# Patient Record
Sex: Female | Born: 1935
Health system: Southern US, Community
[De-identification: ages and names within clinical notes are randomized; demographics above are authoritative.]

## PROBLEM LIST (undated history)

## (undated) DIAGNOSIS — I10 Essential (primary) hypertension: Secondary | ICD-10-CM

## (undated) DIAGNOSIS — F419 Anxiety disorder, unspecified: Secondary | ICD-10-CM

## (undated) DIAGNOSIS — Z803 Family history of malignant neoplasm of breast: Secondary | ICD-10-CM

## (undated) DIAGNOSIS — Z808 Family history of malignant neoplasm of other organs or systems: Secondary | ICD-10-CM

## (undated) DIAGNOSIS — C50919 Malignant neoplasm of unspecified site of unspecified female breast: Secondary | ICD-10-CM

## (undated) HISTORY — DX: Family history of malignant neoplasm of breast: Z80.3

## (undated) HISTORY — DX: Essential (primary) hypertension: I10

## (undated) HISTORY — PX: ABDOMINAL HYSTERECTOMY: SUR658

## (undated) HISTORY — PX: INTRAOCULAR PROSTHESES INSERTION: SHX360

## (undated) HISTORY — DX: Anxiety disorder, unspecified: F41.9

## (undated) HISTORY — DX: Malignant neoplasm of unspecified site of unspecified female breast: C50.919

## (undated) HISTORY — DX: Family history of malignant neoplasm of other organs or systems: Z80.8

---

## 1986-10-31 HISTORY — PX: BREAST LUMPECTOMY: SHX2

## 1998-07-30 ENCOUNTER — Ambulatory Visit (HOSPITAL_COMMUNITY): Admission: RE | Admit: 1998-07-30 | Discharge: 1998-07-30 | Payer: Self-pay | Admitting: Orthopedic Surgery

## 1998-07-30 ENCOUNTER — Encounter: Payer: Self-pay | Admitting: Orthopedic Surgery

## 1999-02-25 ENCOUNTER — Ambulatory Visit (HOSPITAL_COMMUNITY): Admission: RE | Admit: 1999-02-25 | Discharge: 1999-02-25 | Payer: Self-pay | Admitting: Obstetrics & Gynecology

## 1999-04-05 ENCOUNTER — Other Ambulatory Visit: Admission: RE | Admit: 1999-04-05 | Discharge: 1999-04-05 | Payer: Self-pay | Admitting: *Deleted

## 2000-06-05 ENCOUNTER — Other Ambulatory Visit: Admission: RE | Admit: 2000-06-05 | Discharge: 2000-06-05 | Payer: Self-pay | Admitting: *Deleted

## 2001-01-16 ENCOUNTER — Ambulatory Visit (HOSPITAL_COMMUNITY): Admission: RE | Admit: 2001-01-16 | Discharge: 2001-01-16 | Payer: Self-pay | Admitting: Internal Medicine

## 2001-01-16 ENCOUNTER — Encounter: Payer: Self-pay | Admitting: Internal Medicine

## 2002-10-17 ENCOUNTER — Other Ambulatory Visit: Admission: RE | Admit: 2002-10-17 | Discharge: 2002-10-17 | Payer: Self-pay | Admitting: *Deleted

## 2002-12-19 ENCOUNTER — Encounter: Payer: Self-pay | Admitting: Internal Medicine

## 2002-12-19 ENCOUNTER — Ambulatory Visit (HOSPITAL_COMMUNITY): Admission: RE | Admit: 2002-12-19 | Discharge: 2002-12-19 | Payer: Self-pay | Admitting: Internal Medicine

## 2002-12-21 ENCOUNTER — Encounter: Payer: Self-pay | Admitting: Emergency Medicine

## 2002-12-21 ENCOUNTER — Emergency Department (HOSPITAL_COMMUNITY): Admission: EM | Admit: 2002-12-21 | Discharge: 2002-12-21 | Payer: Self-pay | Admitting: Emergency Medicine

## 2002-12-31 ENCOUNTER — Encounter: Payer: Self-pay | Admitting: Internal Medicine

## 2002-12-31 ENCOUNTER — Ambulatory Visit (HOSPITAL_COMMUNITY): Admission: RE | Admit: 2002-12-31 | Discharge: 2002-12-31 | Payer: Self-pay | Admitting: Internal Medicine

## 2003-01-23 ENCOUNTER — Encounter: Payer: Self-pay | Admitting: Gastroenterology

## 2003-01-23 ENCOUNTER — Ambulatory Visit (HOSPITAL_COMMUNITY): Admission: RE | Admit: 2003-01-23 | Discharge: 2003-01-23 | Payer: Self-pay | Admitting: Gastroenterology

## 2003-07-25 ENCOUNTER — Ambulatory Visit (HOSPITAL_COMMUNITY): Admission: RE | Admit: 2003-07-25 | Discharge: 2003-07-25 | Payer: Self-pay | Admitting: Oncology

## 2003-07-25 ENCOUNTER — Encounter (HOSPITAL_COMMUNITY): Payer: Self-pay | Admitting: Oncology

## 2003-08-15 ENCOUNTER — Ambulatory Visit (HOSPITAL_COMMUNITY): Admission: RE | Admit: 2003-08-15 | Discharge: 2003-08-15 | Payer: Self-pay | Admitting: Oncology

## 2003-08-15 ENCOUNTER — Encounter (HOSPITAL_COMMUNITY): Payer: Self-pay | Admitting: Oncology

## 2003-10-20 ENCOUNTER — Other Ambulatory Visit: Admission: RE | Admit: 2003-10-20 | Discharge: 2003-10-20 | Payer: Self-pay | Admitting: *Deleted

## 2004-09-11 ENCOUNTER — Ambulatory Visit: Payer: Self-pay | Admitting: Oncology

## 2005-09-09 ENCOUNTER — Ambulatory Visit: Payer: Self-pay | Admitting: Oncology

## 2005-09-12 ENCOUNTER — Ambulatory Visit (HOSPITAL_COMMUNITY): Admission: RE | Admit: 2005-09-12 | Discharge: 2005-09-12 | Payer: Self-pay | Admitting: Oncology

## 2005-09-12 IMAGING — CR DG CHEST 2V
2 series · 2 of 2 positions shown · non-contrast
Comparison: None.

CLINICAL DATA: Patient has history of breast cancer.  Hypertension. 
 CHEST ? 2 VIEW:

[view not recorded (1 of 2)]
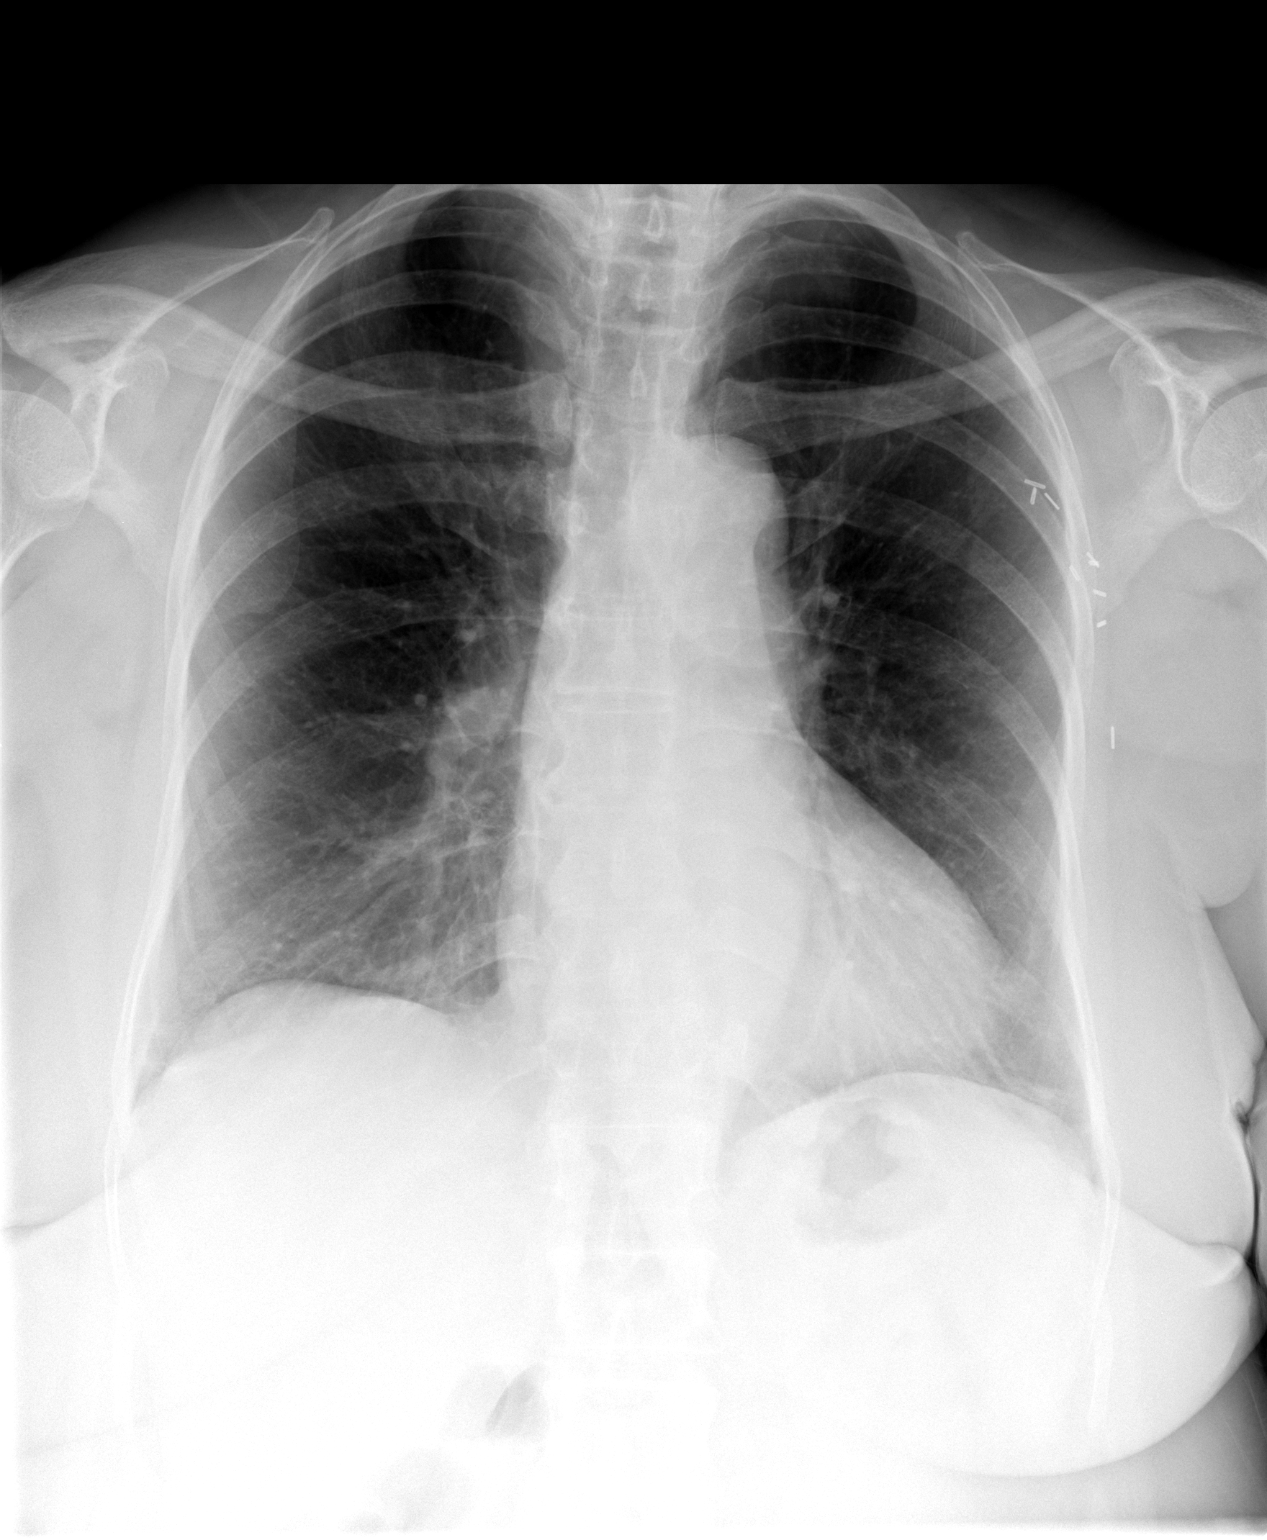

[view not recorded (2 of 2)]
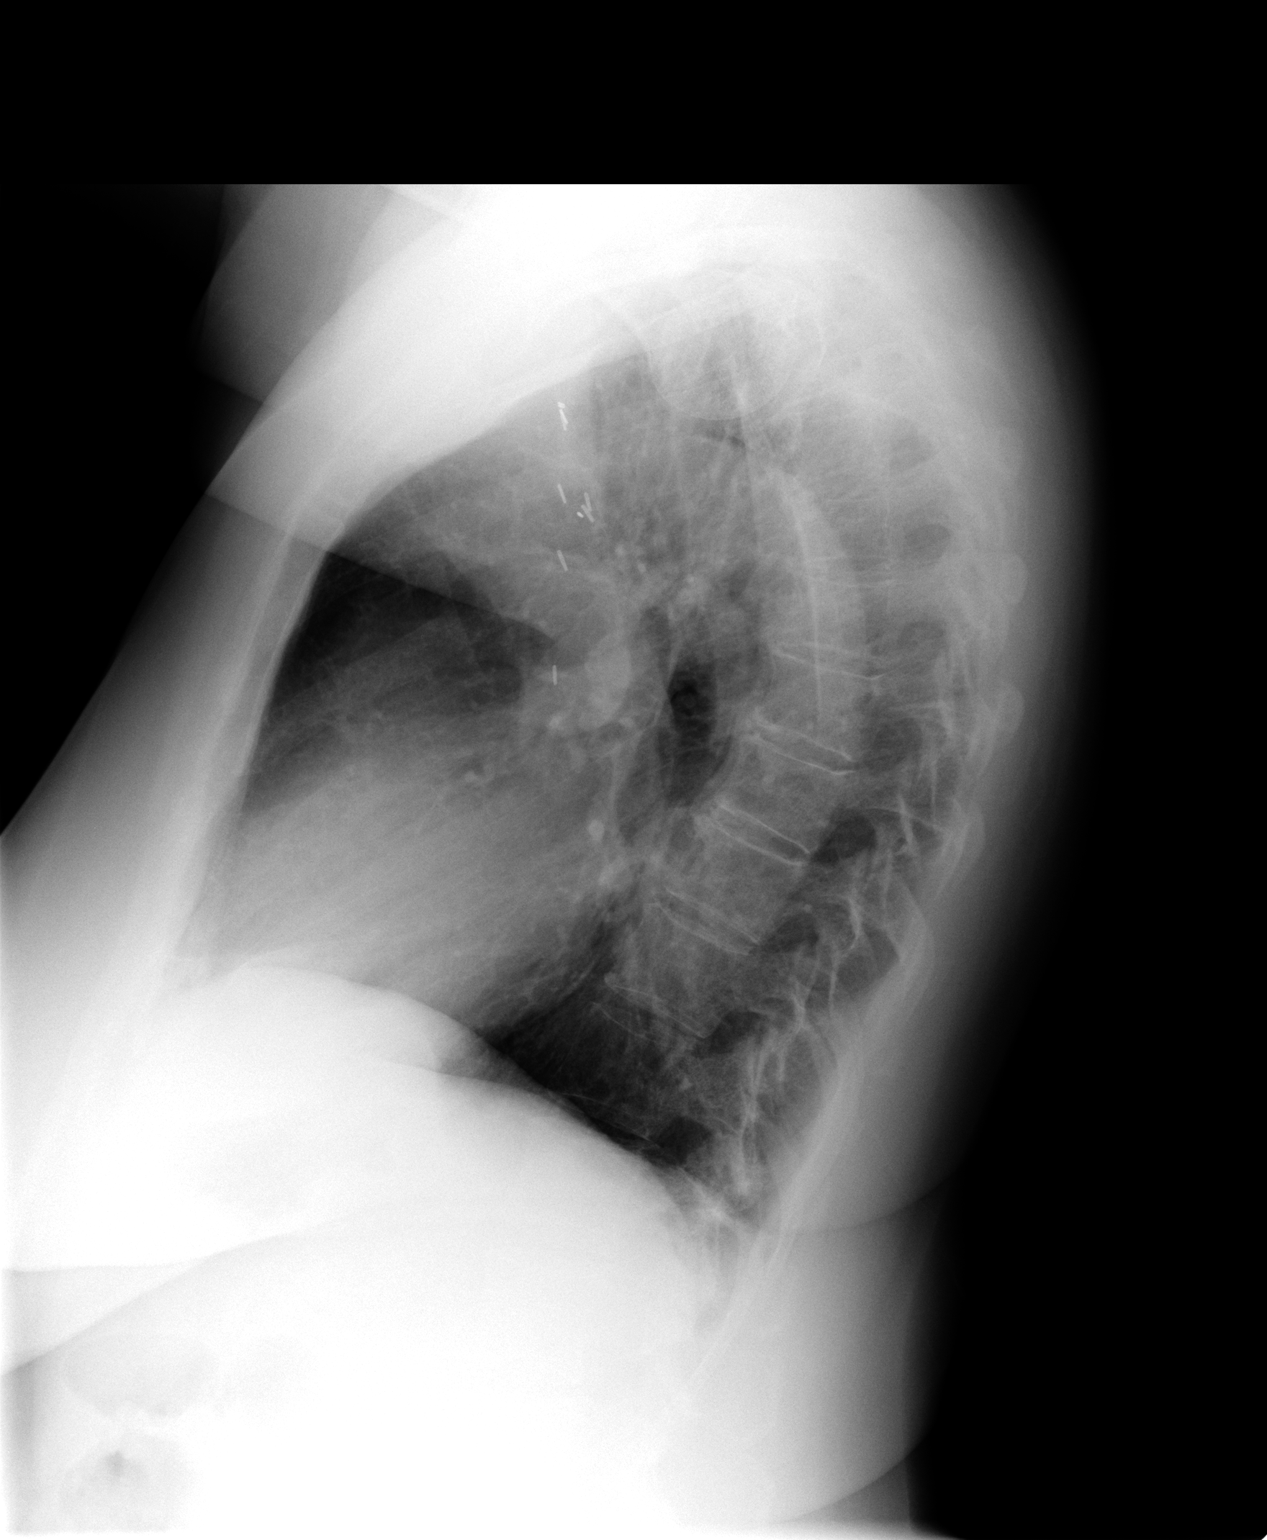

[2 of 2 positions shown; findings below may reference images not displayed]

FINDINGS: PA and lateral views reveal the overall heart size to be prominent.   There is diffuse dilatation of the aorta.  Calcified granulomas are noted particularly throughout the right lung.  Metallic clips are noted in the left axilla.  No active findings.
IMPRESSION: No active disease.

## 2006-04-12 ENCOUNTER — Other Ambulatory Visit: Admission: RE | Admit: 2006-04-12 | Discharge: 2006-04-12 | Payer: Self-pay | Admitting: *Deleted

## 2006-09-07 ENCOUNTER — Ambulatory Visit: Payer: Self-pay | Admitting: Oncology

## 2006-09-11 LAB — COMPREHENSIVE METABOLIC PANEL
ALT: 12 U/L (ref 0–35)
AST: 15 U/L (ref 0–37)
Alkaline Phosphatase: 73 U/L (ref 39–117)
BUN: 27 mg/dL — ABNORMAL HIGH (ref 6–23)
Creatinine, Ser: 1.06 mg/dL (ref 0.40–1.20)
Total Bilirubin: 0.4 mg/dL (ref 0.3–1.2)

## 2006-09-11 LAB — CBC WITH DIFFERENTIAL/PLATELET
BASO%: 2 % (ref 0.0–2.0)
Basophils Absolute: 0.1 10*3/uL (ref 0.0–0.1)
EOS%: 4 % (ref 0.0–7.0)
HCT: 38.4 % (ref 34.8–46.6)
LYMPH%: 28.3 % (ref 14.0–48.0)
MCH: 28.5 pg (ref 26.0–34.0)
MCHC: 33.2 g/dL (ref 32.0–36.0)
MCV: 85.8 fL (ref 81.0–101.0)
MONO%: 10.1 % (ref 0.0–13.0)
NEUT%: 55.6 % (ref 39.6–76.8)
Platelets: 286 10*3/uL (ref 145–400)
lymph#: 1.3 10*3/uL (ref 0.9–3.3)

## 2007-09-06 ENCOUNTER — Ambulatory Visit: Payer: Self-pay | Admitting: Oncology

## 2007-09-10 LAB — COMPREHENSIVE METABOLIC PANEL
AST: 15 U/L (ref 0–37)
Albumin: 4.3 g/dL (ref 3.5–5.2)
Alkaline Phosphatase: 66 U/L (ref 39–117)
BUN: 26 mg/dL — ABNORMAL HIGH (ref 6–23)
Calcium: 9.6 mg/dL (ref 8.4–10.5)
Chloride: 102 mEq/L (ref 96–112)
Creatinine, Ser: 0.87 mg/dL (ref 0.40–1.20)
Glucose, Bld: 111 mg/dL — ABNORMAL HIGH (ref 70–99)
Potassium: 3.7 mEq/L (ref 3.5–5.3)

## 2007-09-10 LAB — CBC WITH DIFFERENTIAL/PLATELET
Basophils Absolute: 0 10*3/uL (ref 0.0–0.1)
EOS%: 2.7 % (ref 0.0–7.0)
Eosinophils Absolute: 0.1 10*3/uL (ref 0.0–0.5)
HCT: 38.7 % (ref 34.8–46.6)
HGB: 13 g/dL (ref 11.6–15.9)
MCH: 28.2 pg (ref 26.0–34.0)
MCV: 84.1 fL (ref 81.0–101.0)
MONO%: 11 % (ref 0.0–13.0)
NEUT#: 3.1 10*3/uL (ref 1.5–6.5)
NEUT%: 63.4 % (ref 39.6–76.8)
RDW: 15.1 % — ABNORMAL HIGH (ref 11.3–14.5)

## 2008-09-05 ENCOUNTER — Ambulatory Visit: Payer: Self-pay | Admitting: Oncology

## 2008-09-09 LAB — CBC WITH DIFFERENTIAL/PLATELET
Basophils Absolute: 0 10*3/uL (ref 0.0–0.1)
EOS%: 2.6 % (ref 0.0–7.0)
HCT: 37 % (ref 34.8–46.6)
HGB: 12.4 g/dL (ref 11.6–15.9)
LYMPH%: 23.6 % (ref 14.0–48.0)
MCH: 28.9 pg (ref 26.0–34.0)
MONO#: 0.5 10*3/uL (ref 0.1–0.9)
NEUT%: 62.5 % (ref 39.6–76.8)
Platelets: 235 10*3/uL (ref 145–400)
lymph#: 1.2 10*3/uL (ref 0.9–3.3)

## 2008-09-09 LAB — COMPREHENSIVE METABOLIC PANEL
BUN: 25 mg/dL — ABNORMAL HIGH (ref 6–23)
CO2: 27 mEq/L (ref 19–32)
Calcium: 9.5 mg/dL (ref 8.4–10.5)
Chloride: 107 mEq/L (ref 96–112)
Creatinine, Ser: 1.05 mg/dL (ref 0.40–1.20)

## 2008-09-09 LAB — LACTATE DEHYDROGENASE: LDH: 155 U/L (ref 94–250)

## 2009-01-09 ENCOUNTER — Ambulatory Visit (HOSPITAL_COMMUNITY): Admission: RE | Admit: 2009-01-09 | Discharge: 2009-01-09 | Payer: Self-pay | Admitting: Internal Medicine

## 2009-04-05 ENCOUNTER — Emergency Department (HOSPITAL_COMMUNITY): Admission: EM | Admit: 2009-04-05 | Discharge: 2009-04-05 | Payer: Self-pay | Admitting: Emergency Medicine

## 2009-09-08 ENCOUNTER — Ambulatory Visit: Payer: Self-pay | Admitting: Oncology

## 2009-09-10 LAB — CBC WITH DIFFERENTIAL/PLATELET
BASO%: 0.3 % (ref 0.0–2.0)
Basophils Absolute: 0 10*3/uL (ref 0.0–0.1)
EOS%: 3.3 % (ref 0.0–7.0)
Eosinophils Absolute: 0.2 10*3/uL (ref 0.0–0.5)
HCT: 37 % (ref 34.8–46.6)
HGB: 12.1 g/dL (ref 11.6–15.9)
LYMPH%: 18.3 % (ref 14.0–49.7)
MCH: 29.1 pg (ref 25.1–34.0)
MCHC: 32.8 g/dL (ref 31.5–36.0)
MCV: 88.6 fL (ref 79.5–101.0)
MONO#: 0.6 10*3/uL (ref 0.1–0.9)
MONO%: 11.1 % (ref 0.0–14.0)
NEUT#: 3.5 10*3/uL (ref 1.5–6.5)
NEUT%: 67 % (ref 38.4–76.8)
Platelets: 270 10*3/uL (ref 145–400)
RBC: 4.17 10*6/uL (ref 3.70–5.45)
RDW: 14.6 % — ABNORMAL HIGH (ref 11.2–14.5)
WBC: 5.2 10*3/uL (ref 3.9–10.3)
lymph#: 1 10*3/uL (ref 0.9–3.3)

## 2009-09-10 LAB — COMPREHENSIVE METABOLIC PANEL
ALT: 17 U/L (ref 0–35)
Alkaline Phosphatase: 66 U/L (ref 39–117)
CO2: 27 mEq/L (ref 19–32)
Creatinine, Ser: 0.9 mg/dL (ref 0.40–1.20)
Sodium: 139 mEq/L (ref 135–145)
Total Bilirubin: 0.4 mg/dL (ref 0.3–1.2)

## 2009-09-10 LAB — LACTATE DEHYDROGENASE: LDH: 156 U/L (ref 94–250)

## 2010-09-09 ENCOUNTER — Ambulatory Visit: Payer: Self-pay | Admitting: Oncology

## 2010-09-13 LAB — CBC WITH DIFFERENTIAL/PLATELET
BASO%: 0.5 % (ref 0.0–2.0)
Basophils Absolute: 0 10*3/uL (ref 0.0–0.1)
EOS%: 3.8 % (ref 0.0–7.0)
Eosinophils Absolute: 0.2 10*3/uL (ref 0.0–0.5)
HCT: 36.3 % (ref 34.8–46.6)
HGB: 12.3 g/dL (ref 11.6–15.9)
LYMPH%: 29 % (ref 14.0–49.7)
MCH: 28.8 pg (ref 25.1–34.0)
MCHC: 33.8 g/dL (ref 31.5–36.0)
MCV: 84.9 fL (ref 79.5–101.0)
MONO#: 0.6 10*3/uL (ref 0.1–0.9)
MONO%: 12.3 % (ref 0.0–14.0)
NEUT#: 2.6 10*3/uL (ref 1.5–6.5)
NEUT%: 54.4 % (ref 38.4–76.8)
Platelets: 233 10*3/uL (ref 145–400)
RBC: 4.27 10*6/uL (ref 3.70–5.45)
RDW: 15 % — ABNORMAL HIGH (ref 11.2–14.5)
WBC: 4.8 10*3/uL (ref 3.9–10.3)
lymph#: 1.4 10*3/uL (ref 0.9–3.3)

## 2010-09-13 LAB — COMPREHENSIVE METABOLIC PANEL
ALT: 13 U/L (ref 0–35)
AST: 18 U/L (ref 0–37)
Albumin: 4.1 g/dL (ref 3.5–5.2)
Alkaline Phosphatase: 68 U/L (ref 39–117)
BUN: 23 mg/dL (ref 6–23)
CO2: 26 mEq/L (ref 19–32)
Calcium: 9.6 mg/dL (ref 8.4–10.5)
Chloride: 106 mEq/L (ref 96–112)
Creatinine, Ser: 0.92 mg/dL (ref 0.40–1.20)
Glucose, Bld: 104 mg/dL — ABNORMAL HIGH (ref 70–99)
Potassium: 4.1 mEq/L (ref 3.5–5.3)
Sodium: 141 mEq/L (ref 135–145)
Total Bilirubin: 0.4 mg/dL (ref 0.3–1.2)
Total Protein: 7.5 g/dL (ref 6.0–8.3)

## 2010-09-13 LAB — LACTATE DEHYDROGENASE: LDH: 167 U/L (ref 94–250)

## 2011-03-18 NOTE — Op Note (Signed)
   NAMEJANET, Jillian Buchanan                            ACCOUNT NO.:  [id number]   MEDICAL RECORD NO.:  [id number]                   PATIENT TYPE:  AMB   LOCATION:  ENDO                                 FACILITY:  [facility]   PHYSICIAN:  Petra Kuba, M.D.                 DATE OF BIRTH:  1933/10/15   DATE OF PROCEDURE:  01/23/2003  DATE OF DISCHARGE:                                 OPERATIVE REPORT   PROCEDURE:  Partial colonoscopy.   INDICATION:  Abdominal pain, bright red blood per rectum, overdue for  colonic screening.  Consent was signed after risks, benefits, methods,  options thoroughly discussed in the office.   MEDICINES USED:  1. Demerol 80.  2. Versed 7.5.   DESCRIPTION OF PROCEDURE:  Rectal inspection was pertinent for an obvious  external significant hemorrhoid.  Digital exam was negative.  First, the  pediatric video adjustable colonoscope was inserted, and we evaluated the  rectum and retroflexed which actually did not reveal any internal  hemorrhoids.  The scope was then straightened and advanced to probably the  mid transverse.  At that point, there was looping and tortuosity and despite  rolling her on her back, abdominal pressure, were unable to advance any  further, and we elected to slowly withdraw.  No addition findings were seen  on either insertion or slow withdrawal.  We went ahead and then inserted the  regular scope, advanced easily to the proximal in the same location but  despite abdominal pressure and rolling her on her back and even on her right  side, could not advance any further.  Seemed to cause pain and looping with  addition pressure.  Elected to withdraw.  No abnormality was seen on  withdrawal, although done rather quickly.  The scope was removed.  The  patient tolerated the procedure adequately.  There was no obvious immediate  complication.   ENDOSCOPIC DIAGNOSES:  1. Significant external hemorrhoid.  2. Otherwise within normal limits to probably  the mid transverse.  Unable to     advance any further despite Pendse adjustable or regular scope and moving     her in all three positions with abdominal pain.   PLAN:  1. Air contrast barium enema.  2. Further work-up and plans, future screening pending those framings.                                               Petra Kuba, M.D.   MEM/MEDQ  D:  01/23/2003  T:  01/24/2003  Job:  [id number]   cc:   Margaretmary Bayley, M.D.  [address]  [place]  [place] [address]  Fax: [phone]

## 2011-08-01 ENCOUNTER — Ambulatory Visit (HOSPITAL_COMMUNITY)
Admission: RE | Admit: 2011-08-01 | Discharge: 2011-08-01 | Disposition: A | Discharge status: Home / Self Care | Payer: Medicare Other | Source: Ambulatory Visit | Attending: Internal Medicine | Admitting: Internal Medicine

## 2011-08-01 ENCOUNTER — Other Ambulatory Visit: Payer: Self-pay | Admitting: Internal Medicine

## 2011-08-01 DIAGNOSIS — R059 Cough, unspecified: Secondary | ICD-10-CM

## 2011-08-01 DIAGNOSIS — R0602 Shortness of breath: Secondary | ICD-10-CM | POA: Insufficient documentation

## 2011-08-01 DIAGNOSIS — IMO0001 Reserved for inherently not codable concepts without codable children: Secondary | ICD-10-CM

## 2011-08-01 DIAGNOSIS — R05 Cough: Secondary | ICD-10-CM

## 2011-08-01 DIAGNOSIS — I10 Essential (primary) hypertension: Secondary | ICD-10-CM | POA: Insufficient documentation

## 2011-08-24 ENCOUNTER — Encounter: Payer: Self-pay | Admitting: Oncology

## 2011-10-07 ENCOUNTER — Telehealth: Payer: Self-pay | Admitting: Oncology

## 2011-10-07 NOTE — Telephone Encounter (Signed)
S/w pt, to r/s 11/15 appt cx'd due to Epic. Pt says she will call us later to r/s. No appts made.

## 2011-11-08 ENCOUNTER — Ambulatory Visit (INDEPENDENT_AMBULATORY_CARE_PROVIDER_SITE_OTHER): Payer: Self-pay | Admitting: General Surgery

## 2011-11-21 ENCOUNTER — Ambulatory Visit (INDEPENDENT_AMBULATORY_CARE_PROVIDER_SITE_OTHER): Payer: Self-pay | Admitting: Surgery

## 2011-11-30 ENCOUNTER — Ambulatory Visit (INDEPENDENT_AMBULATORY_CARE_PROVIDER_SITE_OTHER): Payer: Self-pay | Admitting: Surgery

## 2011-12-13 ENCOUNTER — Ambulatory Visit (INDEPENDENT_AMBULATORY_CARE_PROVIDER_SITE_OTHER): Payer: Self-pay | Admitting: Surgery

## 2015-01-27 DIAGNOSIS — E559 Vitamin D deficiency, unspecified: Secondary | ICD-10-CM | POA: Diagnosis not present

## 2015-01-27 DIAGNOSIS — I10 Essential (primary) hypertension: Secondary | ICD-10-CM | POA: Diagnosis not present

## 2015-01-27 DIAGNOSIS — J40 Bronchitis, not specified as acute or chronic: Secondary | ICD-10-CM | POA: Diagnosis not present

## 2015-01-27 DIAGNOSIS — E039 Hypothyroidism, unspecified: Secondary | ICD-10-CM | POA: Diagnosis not present

## 2015-04-01 DIAGNOSIS — I10 Essential (primary) hypertension: Secondary | ICD-10-CM | POA: Diagnosis not present

## 2015-04-01 DIAGNOSIS — R05 Cough: Secondary | ICD-10-CM | POA: Diagnosis not present

## 2015-04-01 DIAGNOSIS — E559 Vitamin D deficiency, unspecified: Secondary | ICD-10-CM | POA: Diagnosis not present

## 2015-04-01 DIAGNOSIS — D0592 Unspecified type of carcinoma in situ of left breast: Secondary | ICD-10-CM | POA: Diagnosis not present

## 2015-05-06 DIAGNOSIS — E559 Vitamin D deficiency, unspecified: Secondary | ICD-10-CM | POA: Diagnosis not present

## 2015-05-06 DIAGNOSIS — I10 Essential (primary) hypertension: Secondary | ICD-10-CM | POA: Diagnosis not present

## 2015-05-06 DIAGNOSIS — D0592 Unspecified type of carcinoma in situ of left breast: Secondary | ICD-10-CM | POA: Diagnosis not present

## 2015-05-06 DIAGNOSIS — M150 Primary generalized (osteo)arthritis: Secondary | ICD-10-CM | POA: Diagnosis not present

## 2015-06-15 DIAGNOSIS — Z961 Presence of intraocular lens: Secondary | ICD-10-CM | POA: Diagnosis not present

## 2015-06-15 DIAGNOSIS — H3500 Unspecified background retinopathy: Secondary | ICD-10-CM | POA: Diagnosis not present

## 2015-06-15 DIAGNOSIS — H04123 Dry eye syndrome of bilateral lacrimal glands: Secondary | ICD-10-CM | POA: Diagnosis not present

## 2015-06-15 DIAGNOSIS — H16103 Unspecified superficial keratitis, bilateral: Secondary | ICD-10-CM | POA: Diagnosis not present

## 2015-08-27 DIAGNOSIS — D0592 Unspecified type of carcinoma in situ of left breast: Secondary | ICD-10-CM | POA: Diagnosis not present

## 2015-08-27 DIAGNOSIS — M150 Primary generalized (osteo)arthritis: Secondary | ICD-10-CM | POA: Diagnosis not present

## 2015-08-27 DIAGNOSIS — E039 Hypothyroidism, unspecified: Secondary | ICD-10-CM | POA: Diagnosis not present

## 2015-08-27 DIAGNOSIS — I10 Essential (primary) hypertension: Secondary | ICD-10-CM | POA: Diagnosis not present

## 2015-08-27 DIAGNOSIS — M2550 Pain in unspecified joint: Secondary | ICD-10-CM | POA: Diagnosis not present

## 2015-09-17 DIAGNOSIS — D0592 Unspecified type of carcinoma in situ of left breast: Secondary | ICD-10-CM | POA: Diagnosis not present

## 2015-09-17 DIAGNOSIS — R609 Edema, unspecified: Secondary | ICD-10-CM | POA: Diagnosis not present

## 2015-09-17 DIAGNOSIS — M150 Primary generalized (osteo)arthritis: Secondary | ICD-10-CM | POA: Diagnosis not present

## 2015-09-17 DIAGNOSIS — I10 Essential (primary) hypertension: Secondary | ICD-10-CM | POA: Diagnosis not present

## 2015-12-28 DIAGNOSIS — E559 Vitamin D deficiency, unspecified: Secondary | ICD-10-CM | POA: Diagnosis not present

## 2015-12-28 DIAGNOSIS — I10 Essential (primary) hypertension: Secondary | ICD-10-CM | POA: Diagnosis not present

## 2015-12-28 DIAGNOSIS — J40 Bronchitis, not specified as acute or chronic: Secondary | ICD-10-CM | POA: Diagnosis not present

## 2015-12-28 DIAGNOSIS — M150 Primary generalized (osteo)arthritis: Secondary | ICD-10-CM | POA: Diagnosis not present

## 2015-12-28 DIAGNOSIS — D0592 Unspecified type of carcinoma in situ of left breast: Secondary | ICD-10-CM | POA: Diagnosis not present

## 2016-01-05 DIAGNOSIS — Z1231 Encounter for screening mammogram for malignant neoplasm of breast: Secondary | ICD-10-CM | POA: Diagnosis not present

## 2016-01-05 DIAGNOSIS — Z780 Asymptomatic menopausal state: Secondary | ICD-10-CM | POA: Diagnosis not present

## 2016-01-05 DIAGNOSIS — Z853 Personal history of malignant neoplasm of breast: Secondary | ICD-10-CM | POA: Diagnosis not present

## 2016-01-07 DIAGNOSIS — E559 Vitamin D deficiency, unspecified: Secondary | ICD-10-CM | POA: Diagnosis not present

## 2016-01-07 DIAGNOSIS — M150 Primary generalized (osteo)arthritis: Secondary | ICD-10-CM | POA: Diagnosis not present

## 2016-01-07 DIAGNOSIS — I10 Essential (primary) hypertension: Secondary | ICD-10-CM | POA: Diagnosis not present

## 2016-01-07 DIAGNOSIS — D0592 Unspecified type of carcinoma in situ of left breast: Secondary | ICD-10-CM | POA: Diagnosis not present

## 2016-05-09 DIAGNOSIS — F4320 Adjustment disorder, unspecified: Secondary | ICD-10-CM | POA: Diagnosis not present

## 2016-05-09 DIAGNOSIS — D0592 Unspecified type of carcinoma in situ of left breast: Secondary | ICD-10-CM | POA: Diagnosis not present

## 2016-05-09 DIAGNOSIS — M150 Primary generalized (osteo)arthritis: Secondary | ICD-10-CM | POA: Diagnosis not present

## 2016-05-09 DIAGNOSIS — E559 Vitamin D deficiency, unspecified: Secondary | ICD-10-CM | POA: Diagnosis not present

## 2016-05-10 DIAGNOSIS — I10 Essential (primary) hypertension: Secondary | ICD-10-CM | POA: Diagnosis not present

## 2016-05-10 DIAGNOSIS — E039 Hypothyroidism, unspecified: Secondary | ICD-10-CM | POA: Diagnosis not present

## 2016-06-13 DIAGNOSIS — H52203 Unspecified astigmatism, bilateral: Secondary | ICD-10-CM | POA: Diagnosis not present

## 2016-06-13 DIAGNOSIS — H04123 Dry eye syndrome of bilateral lacrimal glands: Secondary | ICD-10-CM | POA: Diagnosis not present

## 2016-06-13 DIAGNOSIS — H16103 Unspecified superficial keratitis, bilateral: Secondary | ICD-10-CM | POA: Diagnosis not present

## 2016-06-13 DIAGNOSIS — Z961 Presence of intraocular lens: Secondary | ICD-10-CM | POA: Diagnosis not present

## 2016-08-09 DIAGNOSIS — D0592 Unspecified type of carcinoma in situ of left breast: Secondary | ICD-10-CM | POA: Diagnosis not present

## 2016-08-09 DIAGNOSIS — E559 Vitamin D deficiency, unspecified: Secondary | ICD-10-CM | POA: Diagnosis not present

## 2016-08-09 DIAGNOSIS — M25562 Pain in left knee: Secondary | ICD-10-CM | POA: Diagnosis not present

## 2016-08-09 DIAGNOSIS — I10 Essential (primary) hypertension: Secondary | ICD-10-CM | POA: Diagnosis not present

## 2016-08-18 ENCOUNTER — Ambulatory Visit (INDEPENDENT_AMBULATORY_CARE_PROVIDER_SITE_OTHER): Payer: BC Managed Care – PPO | Admitting: Orthopaedic Surgery

## 2016-08-18 DIAGNOSIS — M79672 Pain in left foot: Secondary | ICD-10-CM

## 2016-08-18 DIAGNOSIS — M25562 Pain in left knee: Secondary | ICD-10-CM | POA: Diagnosis not present

## 2016-08-18 DIAGNOSIS — M79671 Pain in right foot: Secondary | ICD-10-CM | POA: Diagnosis not present

## 2016-08-19 ENCOUNTER — Ambulatory Visit (INDEPENDENT_AMBULATORY_CARE_PROVIDER_SITE_OTHER): Payer: Self-pay | Admitting: Orthopaedic Surgery

## 2017-05-29 ENCOUNTER — Encounter (INDEPENDENT_AMBULATORY_CARE_PROVIDER_SITE_OTHER): Payer: Self-pay | Admitting: Orthopaedic Surgery

## 2017-05-29 ENCOUNTER — Encounter (INDEPENDENT_AMBULATORY_CARE_PROVIDER_SITE_OTHER): Payer: Self-pay

## 2017-05-29 ENCOUNTER — Ambulatory Visit (INDEPENDENT_AMBULATORY_CARE_PROVIDER_SITE_OTHER): Payer: Medicare Other | Admitting: Orthopaedic Surgery

## 2017-05-29 VITALS — BP 142/77 | HR 89 | Ht 65.0 in | Wt 220.0 lb

## 2017-05-29 DIAGNOSIS — G8929 Other chronic pain: Secondary | ICD-10-CM | POA: Diagnosis not present

## 2017-05-29 DIAGNOSIS — M25562 Pain in left knee: Secondary | ICD-10-CM

## 2017-05-29 DIAGNOSIS — M1812 Unilateral primary osteoarthritis of first carpometacarpal joint, left hand: Secondary | ICD-10-CM

## 2017-05-29 MED ORDER — LIDOCAINE HCL 1 % IJ SOLN
0.5000 mL | INTRAMUSCULAR | Status: AC | PRN
  Administered 2017-05-29: .5 mL

## 2017-05-29 MED ORDER — METHYLPREDNISOLONE ACETATE 40 MG/ML IJ SUSP
20.0000 mg | INTRAMUSCULAR | Status: AC | PRN
  Administered 2017-05-29: 20 mg via INTRA_ARTICULAR

## 2017-05-29 NOTE — Progress Notes (Signed)
Office Visit Note   Patient: Jillian Buchanan           Date of Birth: 03/17/1933           MRN: [id number] Visit Date: 05/29/2017              Requested by: No referring provider defined for this encounter. PCP: Patient, No Pcp Per   Assessment & Plan: Visit Diagnoses:  1. Chronic pain of left knee   2. Localized primary osteoarthritis of carpometacarpal joint of left thumb   Osteoarthritis base of left thumb, osteoarthritis left knee  Plan: Cortisone injection metacarpal carpal joint left thumb. Follow up 2 weeks to inject left knee  Follow-Up Instructions: Return in about 2 weeks (around 06/12/2017).   Orders:  No orders of the defined types were placed in this encounter.  No orders of the defined types were placed in this encounter.     Procedures: Small Joint Inj Date/Time: 05/29/2017 3:57 PM Performed by: Garald Balding Authorized by: Garald Balding   Consent Given by:  Patient Indications:  Pain Location:  Thumb Site:  L thumb [facility] Ultrasound Guided: No   Fluoroscopic Guidance: No   Medications:  0.5 mL lidocaine 1 %; 20 mg methylPREDNISolone acetate 40 MG/ML Aspiration Attempted: No       Clinical Data: No additional findings.   Subjective: Chief Complaint  Patient presents with  . Left Knee - Pain    Jillian Buchanan is an 83 y o that presents with Left knee pain x 8 months. She also has Left thumb pain. numbness and tingling with pain in her L thumb,  Jillian Buchanan is had a prior cortisone injection in her left knee with excellent result. She's had some recurrence of her pain. Prior films are consistent with osteoarthritis. She also recently has been complaining of some pain at the base of her left thumb limiting her grip and creating some compromise of her activities. She denies any history of injury or trauma  HPI  Review of Systems   Objective: Vital Signs: BP (!) 142/77   Pulse 89   Ht 5\' 5"  (1.651 m)   Wt 220 lb (99.8 kg)   BMI 36.61 kg/m    Physical Exam  Ortho Exam left thumb with a positive grind test at the metacarpal carpal joint. Mild evidence of subluxation. Skin intact. Neurovascular exam intact. Good grip and release. Left knee without effusion. Some medial and lateral joint pain. Mild patella crepitation. Full extension and flexed over 105 without instability.  Specialty Comments:  No specialty comments available.  Imaging: No results found.   PMFS History: There are no active problems to display for this patient.  Past Medical History:  Diagnosis Date  . Breast cancer ([place])   . Cataract   . Hypertension     History reviewed. No pertinent family history.  Past Surgical History:  Procedure Laterality Date  . BREAST LUMPECTOMY  1988  . INTRAOCULAR PROSTHESES INSERTION  8/248/2005   Social History   Occupational History  . Not on file.   Social History Main Topics  . Smoking status: Never Smoker  . Smokeless tobacco: Never Used  . Alcohol use No  . Drug use: No  . Sexual activity: No     Garald Balding, MD   Note - This record has been created using [employer].  Chart creation errors have been sought, but may not always  have been located. Such creation errors do not  reflect on  the standard of medical care.

## 2017-06-23 ENCOUNTER — Ambulatory Visit (INDEPENDENT_AMBULATORY_CARE_PROVIDER_SITE_OTHER): Payer: Medicare Other

## 2017-06-23 ENCOUNTER — Ambulatory Visit (INDEPENDENT_AMBULATORY_CARE_PROVIDER_SITE_OTHER): Payer: Medicare Other | Admitting: Orthopaedic Surgery

## 2017-06-23 ENCOUNTER — Encounter (INDEPENDENT_AMBULATORY_CARE_PROVIDER_SITE_OTHER): Payer: Self-pay | Admitting: Orthopaedic Surgery

## 2017-06-23 VITALS — BP 140/80 | HR 75 | Resp 14 | Ht 65.0 in | Wt 220.0 lb

## 2017-06-23 DIAGNOSIS — M25562 Pain in left knee: Secondary | ICD-10-CM

## 2017-06-23 DIAGNOSIS — G8929 Other chronic pain: Secondary | ICD-10-CM

## 2017-06-23 DIAGNOSIS — M79641 Pain in right hand: Secondary | ICD-10-CM

## 2017-06-23 DIAGNOSIS — M79642 Pain in left hand: Secondary | ICD-10-CM

## 2017-06-23 MED ORDER — BUPIVACAINE HCL 0.5 % IJ SOLN
3.0000 mL | INTRAMUSCULAR | Status: AC | PRN
  Administered 2017-06-23: 3 mL via INTRA_ARTICULAR

## 2017-06-23 MED ORDER — LIDOCAINE HCL 1 % IJ SOLN
5.0000 mL | INTRAMUSCULAR | Status: AC | PRN
  Administered 2017-06-23: 5 mL

## 2017-06-23 MED ORDER — METHYLPREDNISOLONE ACETATE 40 MG/ML IJ SUSP
80.0000 mg | INTRAMUSCULAR | Status: AC | PRN
  Administered 2017-06-23: 80 mg

## 2017-06-23 NOTE — Progress Notes (Signed)
Office Visit Note   Patient: Jillian Buchanan           Date of Birth: [birth year]           MRN: [id number] Visit Date: 06/23/2017              Requested by: No referring provider defined for this encounter. PCP: Patient, No Pcp Per   Assessment & Plan: Visit Diagnoses:  1. Pain in left hand   2. Chronic pain of left knee   3. Pain in right hand   Osteoarthritis bilateral basilar thumb joints. Moderate degenerative arthrosis left knee Plan: Long discussion regarding diagnosis and treatment options of both hands and left knee. Discussed x-ray findings. Jillian Buchanan is comfortable wearing some plans that she purchased at a [employer] and using Aleve. I injected the base of her left thumb last month and is doing relatively well. Not interested in further injections or surgery. Cortisone injection left knee and monitor her response  Follow-Up Instructions: No Follow-up on file.   Orders:  Orders Placed This Encounter  Procedures  . XR KNEE 3 VIEW LEFT  . XR Hand Complete Right  . XR Hand Complete Left   No orders of the defined types were placed in this encounter.     Procedures: Large Joint Inj Date/Time: 06/23/2017 12:40 PM Performed by: Garald Balding Authorized by: Garald Balding   Consent Given by:  Patient Timeout: prior to procedure the correct patient, procedure, and site was verified   Indications:  Pain and joint swelling Location:  Knee Site:  L knee Prep: patient was prepped and draped in usual sterile fashion   Needle Size:  25 G Needle Length:  1.5 inches Approach:  Anteromedial Ultrasound Guidance: No   Fluoroscopic Guidance: No   Arthrogram: No   Medications:  5 mL lidocaine 1 %; 80 mg methylPREDNISolone acetate 40 MG/ML; 3 mL bupivacaine 0.5 % Aspiration Attempted: No   Patient tolerance:  Patient tolerated the procedure well with no immediate complications     Clinical Data: No additional findings.   Subjective: Chief Complaint    Patient presents with  . Left Knee - Pain, Edema    Jillian Buchanan is an 83 y o that presents with chronic Left knee pain x years. Denies injury, no calf pain, no numbness or tingling in L foot/toes  Still having some discomfort in both of her hands particularly at the base of the thumbs. She did purchase a splint for her left thumb which seems to help. She also is been taking Aleve on an as-needed basis which also helps. She denies any numbness or tingling. She's had some decrease in her grip strength. Also complaining of pain in her left knee with some "swelling and aching. Denies history of injury or trauma fever or chills. No numbness in her feet. Eyes any significant back or left hip pain. Pain is mostly along the lateral compartment. No feeling of instability  HPI  Review of Systems  Constitutional: Negative for chills, fatigue and fever.  Eyes: Negative for itching.  Respiratory: Negative for chest tightness and shortness of breath.   Cardiovascular: Positive for leg swelling. Negative for chest pain and palpitations.  Gastrointestinal: Negative for blood in stool, constipation and diarrhea.  Musculoskeletal: Positive for joint swelling and neck stiffness. Negative for back pain and neck pain.  Neurological: Positive for headaches. Negative for dizziness, weakness and numbness.  Hematological: Does not bruise/bleed easily.  Psychiatric/Behavioral: Positive for sleep  disturbance. The patient is not nervous/anxious.      Objective: Vital Signs: BP 140/80   Pulse 75   Resp 14   Ht 5\' 5"  (1.651 m)   Wt 220 lb (99.8 kg)   BMI 36.61 kg/m   Physical Exam  Ortho Exam hypertrophic changes at the base of both thumbs with positive grind test. Skin intact. Neurovascular exam intact. Some week weakened grip strength based on the pain at the base of the thumbs. There is to have slight subluxation at the thumb metacarpocarpal joints bilaterally. Mild pain at same location bilaterally Left knee  with minimal effusion. Lacks a few degrees to full extension. Slight increased valgus with weightbearing and mostly lateral joint pain. Positive patellar crepitation. No popliteal pain. Mild swelling of left ankle. No calf pain. +1 pulses. Skin intact. Straight leg raise negative bilaterally painless range of motion left hip. Awake alert and oriented 3  Specialty Comments:  No specialty comments available.  Imaging: Xr Hand Complete Left  Result Date: 06/23/2017 Films of the left hand were obtained several projections. There is evidence of osteoarthritis at the base of the thumb at the medical carpal carpal joint. There is slight subluxation and decrease in the joint space. No obvious arthritic changes at the thumb IP joint or metacarpal phalangeal joint. No evidence of fracture  Xr Hand Complete Right  Result Date: 06/23/2017 Films of the right hand were obtained in several projections. Patient's symptoms are localized along the base of the thumb where she does have some arthritic changes at the thumb metacarpocarpal joint. there is minimal subluxation. There is  spur formation and decrease in the joint space. No evidence of fracture. Some arthritic changes at the thumb metacarpal phalangeal joint where she is not symptomatic  Xr Knee 3 View Left  Result Date: 06/23/2017 Films of the left knee were obtained in 3 projections standing. There are tricompartmental degenerative changes worse in the lateral compartment where there is narrowing of the joint space and osteophyte formation peripherally. No obvious ectopic calcification. Approximately 5 of valgus. Gender changes the patellofemoral joint with mild joint particularly laterally where there are more osteophytes and medial. Findings consistent with moderate osteoarthritis    PMFS History: There are no active problems to display for this patient.  Past Medical History:  Diagnosis Date  . Breast cancer ([place])   . Cataract   .  Hypertension     History reviewed. No pertinent family history.  Past Surgical History:  Procedure Laterality Date  . BREAST LUMPECTOMY  1988  . INTRAOCULAR PROSTHESES INSERTION  8/248/2005   Social History   Occupational History  . Not on file.   Social History Main Topics  . Smoking status: Never Smoker  . Smokeless tobacco: Never Used  . Alcohol use No  . Drug use: No  . Sexual activity: No

## 2017-11-27 ENCOUNTER — Ambulatory Visit (INDEPENDENT_AMBULATORY_CARE_PROVIDER_SITE_OTHER): Payer: Medicare Other | Admitting: Orthopaedic Surgery

## 2017-11-27 ENCOUNTER — Ambulatory Visit (INDEPENDENT_AMBULATORY_CARE_PROVIDER_SITE_OTHER): Payer: Self-pay

## 2017-11-27 ENCOUNTER — Encounter (INDEPENDENT_AMBULATORY_CARE_PROVIDER_SITE_OTHER): Payer: Self-pay | Admitting: Orthopaedic Surgery

## 2017-11-27 VITALS — BP 150/84 | HR 70 | Resp 14 | Ht 63.0 in | Wt 200.0 lb

## 2017-11-27 DIAGNOSIS — M25511 Pain in right shoulder: Secondary | ICD-10-CM | POA: Diagnosis not present

## 2017-11-27 DIAGNOSIS — M1712 Unilateral primary osteoarthritis, left knee: Secondary | ICD-10-CM

## 2017-11-27 DIAGNOSIS — G8929 Other chronic pain: Secondary | ICD-10-CM

## 2017-11-27 MED ORDER — DICLOFENAC SODIUM 1 % TD GEL: 2.0000 g | Freq: Four times a day (QID) | TRANSDERMAL | 2 refills | Status: DC

## 2017-11-27 MED ORDER — LIDOCAINE HCL 1 % IJ SOLN
2.0000 mL | INTRAMUSCULAR | Status: AC | PRN
  Administered 2017-11-27: 2 mL

## 2017-11-27 MED ORDER — METHYLPREDNISOLONE ACETATE 40 MG/ML IJ SUSP
80.0000 mg | INTRAMUSCULAR | Status: AC | PRN
  Administered 2017-11-27: 80 mg

## 2017-11-27 MED ORDER — BUPIVACAINE HCL 0.5 % IJ SOLN
2.0000 mL | INTRAMUSCULAR | Status: AC | PRN
  Administered 2017-11-27: 2 mL via INTRA_ARTICULAR

## 2017-11-27 NOTE — Progress Notes (Signed)
Office Visit Note   Patient: Jillian Buchanan           Date of Birth: 07/10/1933           MRN: [id number] Visit Date: 11/27/2017              Requested by: No referring provider defined for this encounter. PCP: Seward Carol, MD   Assessment & Plan: Visit Diagnoses:  1. Chronic right shoulder pain   2. Unilateral primary osteoarthritis, left knee     Plan: Long discussion regarding problem with right shoulder, both hands specifically left thumb and left knee. Has arthritis at the base of both thumbs. Osteoarthritis left knee per prior films. We'll set up physical therapy for left shoulder, Voltaren gel for her joint problems and inject left knee with cortisone. Office 6 weeks  Follow-Up Instructions: Return in about 6 weeks (around 01/08/2018).   Orders:  Orders Placed This Encounter  Procedures  . XR Shoulder Right  . Ambulatory referral to Physical Therapy   Meds ordered this encounter  Medications  . diclofenac sodium (VOLTAREN) 1 % GEL    Sig: Apply 2 g topically 4 (four) times daily.    Dispense:  3 Tube    Refill:  2      Procedures: Large Joint Inj: L knee on 11/27/2017 9:44 AM Indications: pain and diagnostic evaluation Details: 25 G 1.5 in needle, anteromedial approach  Arthrogram: No  Medications: 2 mL lidocaine 1 %; 2 mL bupivacaine 0.5 %; 80 mg methylPREDNISolone acetate 40 MG/ML Procedure, treatment alternatives, risks and benefits explained, specific risks discussed. Consent was given by the patient. Patient was prepped and draped in the usual sterile fashion.       Clinical Data: No additional findings.   Subjective: Chief Complaint  Patient presents with  . Right Shoulder - Numbness, Pain, Weakness    Jillian Buchanan is an 84 y o here today for chronic right shoulder pain x 2 months. She relates very limited ROM including overhead raise, behind her back, neck stiffness, and dropping things.  Jillian Buchanan is been seen on a number of occasions in the  past for multiple joint problems. She has evidence of osteoarthritis of her left knee and has had successful cortisone injections. She also has arthritis at the base of both thumbs particularly on the left side. She recently has developed some problems with her right shoulder without injury or trauma. She feels like she is "a little bit weak and somewhat stiff. She takes as many as 2 Aleve in the morning and Tylenol during the day which makes a big difference. Office visit over 30 minutes discussing treatment options for each of the joint problems. Would suggest a course of exercises possibly even water aerobics. We'll check back in about 6 weeks HPI  Review of Systems  Constitutional: Positive for fatigue. Negative for chills and fever.  Eyes: Negative for itching.  Respiratory: Positive for apnea. Negative for chest tightness and shortness of breath.   Cardiovascular: Negative for chest pain, palpitations and leg swelling.  Gastrointestinal: Negative for blood in stool, constipation and diarrhea.  Endocrine: Negative for polyuria.  Genitourinary: Negative for dysuria.  Musculoskeletal: Positive for neck pain and neck stiffness. Negative for back pain and joint swelling.  Allergic/Immunologic: Negative for immunocompromised state.  Neurological: Positive for weakness and numbness. Negative for dizziness.  Hematological: Does not bruise/bleed easily.  Psychiatric/Behavioral: Positive for sleep disturbance. The patient is nervous/anxious.      Objective: Vital  Signs: BP (!) 150/84   Pulse 70   Resp 14   Ht 5\' 3"  (1.6 m)   Wt 200 lb (90.7 kg)   BMI 35.43 kg/m   Physical Exam  Ortho Exam awake alert and oriented 3. Comfortable sitting. Does not walk with a limp. Right shoulder exam with full overhead motion. No evidence of adhesive capsulitis. Actually had good strength with internal/external rotation. Minimally positive empty can testing and impingement testing. Good grip and release.  Skin intact. Some tenderness along the lateral and anterior subacromial region. Biceps intact.  Hypertrophic changes at the base of both thumbs left greater than right. Positive grind test on the left. Good grip and release neurovascular exam intact.  Left knee with predominantly medial joint pain. Large knees and large legs +1 pulses. No distal edema. Some patellar crepitation. No increased varus or valgus  Specialty Comments:  No specialty comments available.  Imaging: Xr Shoulder Right  Result Date: 11/27/2017 Films of the right shoulder obtained in several projections. There was downsloping of the acromion laterally. There is a bulky acromioclavicular joint with probable's impingement. There is some flattening around the graded tuberosity with sclerosis. There is appears to be a normal space between the humeral head and acromion. The humeral head is centered about the glenoid. Diffuse osteopenia. No evidence of fracture. Films demonstrate some early arthritis possibly impingement. No obvious rotator cuff tear arthropathy    PMFS History: Patient Active Problem List   Diagnosis Date Noted  . Chronic right shoulder pain 11/27/2017  . Unilateral primary osteoarthritis, left knee 11/27/2017   Past Medical History:  Diagnosis Date  . Breast cancer ([place])   . Cataract   . Hypertension     History reviewed. No pertinent family history.  Past Surgical History:  Procedure Laterality Date  . BREAST LUMPECTOMY  1988  . INTRAOCULAR PROSTHESES INSERTION  8/248/2005   Social History   Occupational History  . Not on file  Tobacco Use  . Smoking status: Never Smoker  . Smokeless tobacco: Never Used  Substance and Sexual Activity  . Alcohol use: No  . Drug use: No  . Sexual activity: No

## 2017-12-07 ENCOUNTER — Ambulatory Visit: Payer: BC Managed Care – PPO

## 2018-01-08 ENCOUNTER — Ambulatory Visit (INDEPENDENT_AMBULATORY_CARE_PROVIDER_SITE_OTHER): Payer: Medicare Other | Admitting: Orthopaedic Surgery

## 2018-01-11 ENCOUNTER — Other Ambulatory Visit: Payer: Self-pay

## 2018-01-11 ENCOUNTER — Ambulatory Visit: Payer: Medicare Other | Attending: Orthopaedic Surgery

## 2018-01-11 DIAGNOSIS — M25611 Stiffness of right shoulder, not elsewhere classified: Secondary | ICD-10-CM | POA: Diagnosis not present

## 2018-01-11 DIAGNOSIS — R252 Cramp and spasm: Secondary | ICD-10-CM | POA: Diagnosis not present

## 2018-01-11 DIAGNOSIS — R293 Abnormal posture: Secondary | ICD-10-CM | POA: Diagnosis not present

## 2018-01-11 DIAGNOSIS — G8929 Other chronic pain: Secondary | ICD-10-CM

## 2018-01-11 DIAGNOSIS — M25511 Pain in right shoulder: Secondary | ICD-10-CM | POA: Diagnosis not present

## 2018-01-11 NOTE — Therapy (Signed)
[place], [place], [address] Phone: [phone]   Fax:  [phone]  Physical Therapy Evaluation  Patient Details  Name: Jillian Buchanan MRN: [id number] Date of Birth: 12-Jul-1933 Referring Provider: Joni Fears, MD   Encounter Date: 01/11/2018  PT End of Session - 01/11/18 1013    Visit Number  1    Number of Visits  12    Date for PT Re-Evaluation  02/23/18    Authorization Type  [employer]    PT Start Time  1015    PT Stop Time  1100    PT Time Calculation (min)  45 min    Activity Tolerance  Patient tolerated treatment well;Patient limited by pain    Behavior During Therapy  [facility] for tasks assessed/performed       Past Medical History:  Diagnosis Date  . Breast cancer ([place])   . Cataract   . Hypertension     Past Surgical History:  Procedure Laterality Date  . BREAST LUMPECTOMY  1988  . INTRAOCULAR PROSTHESES INSERTION  8/248/2005    There were no vitals filed for this visit.   Subjective Assessment - 01/11/18 1024    Subjective  She reports RT shoulder pain with lifting arm and feeling weak. AM feels tingle in RT fingers.  no injury.   MD feels related to OA.         Limitations  Lifting reaching , opening jars,  longer to dress,  lifting pans,   reaching into closet.       How long can you sit comfortably?  NA    How long can you stand comfortably?  NA    How long can you walk comfortably?  100 yards due to knee pain    Diagnostic tests  xrays:  OA    Patient Stated Goals  She wants shoulder to ease pain and get stronger.     Currently in Pain?  No/denies    Pain Score  6  with use of RT arm    Pain Location  Shoulder    Pain Orientation  Right    Pain Descriptors / Indicators  Burning;Tingling;Sharp    Pain Type  Chronic pain    Pain Onset  More than a month ago    Pain Frequency  Intermittent    Aggravating Factors   reaching /lifting/ PM pain    Pain Relieving Factors  stop  activity,          [facility] PT Assessment - 01/11/18 0001      Assessment   Medical Diagnosis  RT shoulder pain     Referring Provider  Joni Fears, MD    Onset Date/Surgical Date  -- 09/2017    Hand Dominance  Right    Next MD Visit  month or so    Prior Therapy  no      Precautions   Precautions  None      Restrictions   Weight Bearing Restrictions  No      Balance Screen   Has the patient fallen in the past 6 months  No      Prior Function   Level of Independence  Independent    Vocation  Retired      Associate Professor   Overall Cognitive Status  Within Functional Limits for tasks assessed      ROM / Strength   AROM / PROM / Strength  AROM;Strength      AROM  AROM Assessment Site  Shoulder    Right/Left Shoulder  Right;Left    Right Shoulder Flexion  150 Degrees    Right Shoulder ABduction  130 Degrees    Right Shoulder Internal Rotation  49 Degrees    Right Shoulder External Rotation  90 Degrees    Right Shoulder Horizontal ABduction  5 Degrees    Right Shoulder Horizontal  ADduction  115 Degrees    Left Shoulder Flexion  150 Degrees    Left Shoulder ABduction  162 Degrees    Left Shoulder Internal Rotation  70 Degrees reach behind back 3 inches less on RT    Left Shoulder External Rotation  90 Degrees    Left Shoulder Horizontal ABduction  115 Degrees      Strength   Overall Strength Comments  WNL bilaterally with  pain tesitng flex/abduct/ER       Palpation   Palpation comment  most tender anterior but also posterior , and into RT biceps less so  supraspinatrus.             Objective measurements completed on examination: See above findings.              PT Education - 01/11/18 1016    Education provided  Yes    Education Details  POC    Person(s) Educated  Patient    Methods  Explanation    Comprehension  Verbalized understanding       PT Short Term Goals - 01/11/18 1015      PT SHORT TERM GOAL #1   Title  She will be  independent with initial HEP    Time  2    Period  Weeks    Status  New      PT SHORT TERM GOAL #2   Title  She will report pain decreased 30% or more with normal home tasks    Time  3    Period  Weeks    Status  New        PT Long Term Goals - 01/11/18 1015      PT LONG TERM GOAL #1   Title  she will report pain decr 50% or more with normal home tasks    Time  6    Period  Weeks    Status  New      PT LONG TERM GOAL #2   Title  She will be independent with all HEP issued    Time  6    Period  Weeks    Status  New             Plan - 01/11/18 1014    Clinical Impression Statement  Ms Hidrogo presents with chronic RT shoulder pain      Clinical Presentation  Stable    Clinical Decision Making  Low    Rehab Potential  Good    PT Frequency  2x / week    PT Duration  6 weeks    PT Treatment/Interventions  Cryotherapy;Iontophoresis 4mg /ml Dexamethasone;Moist Heat;Ultrasound;Passive range of motion;Therapeutic exercise;Patient/family education;Manual techniques    PT Next Visit Plan  manual and modalities for pain , HEP    Consulted and Agree with Plan of Care  Patient       Patient will benefit from skilled therapeutic intervention in order to improve the following deficits and impairments:  Pain, Impaired UE functional use, Decreased activity tolerance  Visit Diagnosis: Chronic right shoulder pain     Problem List Patient  Active Problem List   Diagnosis Date Noted  . Chronic right shoulder pain 11/27/2017  . Unilateral primary osteoarthritis, left knee 11/27/2017    [name]  PT 01/11/2018, 11:08 AM  [facility] [address] [place], [place], [address] Phone: [phone]   Fax:  [phone]  Name: KAMARIYA BLEVENS MRN: [id number] Date of Birth: 1933-01-02

## 2018-01-11 NOTE — Patient Instructions (Signed)
Stretch Break - Neck Sideways Bending    Gently tilt head to left  side. Return to starting position. After getting here turn slightly left or right to see if incr stretch Repeat __2__ times every _2___ hours.   HOLd 30 secSCAPULA: Retraction     SCAPULA: Retraction    . Pinch shoulder blades together. Do not shrug shoulders. Hold _3-5__ seconds. Use___ lb weight on cane. __2-5_ reps per set, _4-5Behind Back    Reach hand up back. Keep shoulders down. Hint: Perform in front of mirror for feedback.  _2-3__ reps per set, __2-3_ sets per day, __7_ days per week Hold for 30 sec Copyright  VHI. All rights reserved.  __ sets per day, _7__ days per week  Copyright  VHI. All rights reserved.  rights reserved.

## 2018-01-16 DIAGNOSIS — Z1231 Encounter for screening mammogram for malignant neoplasm of breast: Secondary | ICD-10-CM | POA: Diagnosis not present

## 2018-01-16 DIAGNOSIS — Z853 Personal history of malignant neoplasm of breast: Secondary | ICD-10-CM | POA: Diagnosis not present

## 2018-01-23 ENCOUNTER — Ambulatory Visit: Payer: Medicare Other

## 2018-01-23 DIAGNOSIS — G8929 Other chronic pain: Secondary | ICD-10-CM | POA: Diagnosis not present

## 2018-01-23 DIAGNOSIS — R252 Cramp and spasm: Secondary | ICD-10-CM

## 2018-01-23 DIAGNOSIS — M25511 Pain in right shoulder: Secondary | ICD-10-CM | POA: Diagnosis not present

## 2018-01-23 DIAGNOSIS — M25611 Stiffness of right shoulder, not elsewhere classified: Secondary | ICD-10-CM

## 2018-01-23 DIAGNOSIS — R293 Abnormal posture: Secondary | ICD-10-CM

## 2018-01-23 NOTE — Patient Instructions (Signed)
Strengthening: Resisted Internal Rotation   Hold tubing in left hand, elbow at side and forearm out. Rotate forearm in across body. Repeat ____ times per set. Do ____ sets per session. Do ____ sessions per day.  http://orth.exer.us/830   Copyright  VHI. All rights reserved.  Strengthening: Resisted External Rotation   Hold tubing in right hand, elbow at side and forearm across body. Rotate forearm out. Repeat ____ times per set. Do ____ sets per session. Do ____ sessions per day.  http://orth.exer.us/828   Copyright  VHI. All rights reserved.  Strengthening: Resisted Flexion   Hold tubing with left arm at side. Pull forward and up. Move shoulder through pain-free range of motion. Repeat ____ times per set. Do ____ sets per session. Do ____ sessions per day.  http://orth.exer.us/824   Copyright  VHI. All rights reserved.  Strengthening: Resisted Extension   Hold tubing in right hand, arm forward. Pull arm back, elbow straight. Repeat ____ times per set. Do ____ sets per session. Do ____ sessions per day.  http://orth.exer.us/832   Copyright  VHI. All rights reserved.  IONTOPHORESIS PATIENT PRECAUTIONS & CONTRAINDICATIONS:  . Redness under one or both electrodes can occur.  This characterized by a uniform redness that usually disappears within 12 hours of treatment. . Small pinhead size blisters may result in response to the drug.  Contact your physician if the problem persists more than 24 hours. . On rare occasions, iontophoresis therapy can result in temporary skin reactions such as rash, inflammation, irritation or burns.  The skin reactions may be the result of individual sensitivity to the ionic solution used, the condition of the skin at the start of treatment, reaction to the materials in the electrodes, allergies or sensitivity to dexamethasone, or a poor connection between the patch and your skin.  Discontinue using iontophoresis if you have any of these reactions  and report to your therapist. . Remove the Patch or electrodes if you have any undue sensation of pain or burning during the treatment and report discomfort to your therapist. . Tell your Therapist if you have had known adverse reactions to the application of electrical current. . If using the Patch, the LED light will turn off when treatment is complete and the patch can be removed.  Approximate treatment time is 1-3 hours.  Remove the patch when light goes off or after 6 hours. . The Patch can be worn during normal activity, however excessive motion where the electrodes have been placed can cause poor contact between the skin and the electrode or uneven electrical current resulting in greater risk of skin irritation. Marland Kitchen Keep out of the reach of children.   . DO NOT use if you have a cardiac pacemaker or any other electrically sensitive implanted device. . DO NOT use if you have a known sensitivity to dexamethasone. . DO NOT use during Magnetic Resonance Imaging (MRI). . DO NOT use over broken or compromised skin (e.g. sunburn, cuts, or acne) due to the increased risk of skin reaction. . DO NOT SHAVE over the area to be treated:  To establish good contact between the Patch and the skin, excessive hair may be clipped. . DO NOT place the Patch or electrodes on or over your eyes, directly over your heart, or brain. . DO NOT reuse the Patch or electrodes as this may cause burns to occur.

## 2018-01-23 NOTE — Therapy (Signed)
[place], [place], [address] Phone: [phone]   Fax:  [phone]  Physical Therapy Treatment  Patient Details  Name: Jillian Buchanan MRN: [id number] Date of Birth: 10/29/1933 Referring Provider: Joni Fears, MD   Encounter Date: 01/23/2018  PT End of Session - 01/23/18 0852    Visit Number  2    Number of Visits  12    Date for PT Re-Evaluation  02/23/18    Authorization Type  BCBS    PT Start Time  [phone] pt late    PT Stop Time  0932    PT Time Calculation (min)  40 min    Activity Tolerance  Patient tolerated treatment well    Behavior During Therapy  [facility] for tasks assessed/performed       Past Medical History:  Diagnosis Date  . Breast cancer ([place])   . Cataract   . Hypertension     Past Surgical History:  Procedure Laterality Date  . BREAST LUMPECTOMY  1988  . INTRAOCULAR PROSTHESES INSERTION  8/248/2005    There were no vitals filed for this visit.  Subjective Assessment - 01/23/18 0855    Subjective  No pain now . PAin this AM earlier.     Currently in Pain?  No/denies                No data recorded       [facility] Adult PT Treatment/Exercise - 01/23/18 0001      Exercises   Exercises  Shoulder      Shoulder Exercises: Standing   Other Standing Exercises  rock woiod yellow x 10-12 rpes and instructed for home      Modalities   Modalities  Iontophoresis      Iontophoresis   Type of Iontophoresis  Dexamethasone    Location  ant RT shoulder    Dose  1cc    Time  4-6 hours       Reviewed all HEP and she needed some minor modifications of each anda able to do them correctly post.       PT Education - 01/23/18 0935    Education Details  ionto instructions, rockwood    Person(s) Educated  Patient    Methods  Explanation;Demonstration;Tactile cues;Verbal cues;Handout    Comprehension  Returned demonstration;Verbalized understanding       PT Short  Term Goals - 01/11/18 1015      PT SHORT TERM GOAL #1   Title  She will be independent with initial HEP    Time  2    Period  Weeks    Status  New      PT SHORT TERM GOAL #2   Title  She will report pain decreased 30% or more with normal home tasks    Time  3    Period  Weeks    Status  New      PT SHORT TERM GOAL #3   Title  Reaching behind back equal to LT    Time  6    Period  Weeks    Status  Revised      PT SHORT TERM GOAL #4   Title  Abuction active RT shoulder improved to 150 degrees     Time  6    Period  Weeks    Status  New        PT Long Term Goals - 01/11/18 1015      PT LONG TERM  GOAL #1   Title  she will report pain decr 50% or more with normal home tasks    Time  6    Period  Weeks    Status  New      PT LONG TERM GOAL #2   Title  She will be independent with all HEP issued    Time  6    Period  Weeks    Status  New      PT LONG TERM GOAL #3   Title  She will be able to lift 3 items of clothes from closet with 1-2 max pain    Time  6    Period  Weeks    Status  New      PT LONG TERM GOAL #4   Title  She will reprot PM pain eliminated    Time  6    Period  Weeks    Status  New      PT LONG TERM GOAL #5   Title  FOTO score improved to  40 % limited or better    Time  6    Period  Weeks    Status  New            Plan - 01/23/18 0934    Clinical Impression Statement  Pain under control today. Did well with all HEP after instruction.  Progress as able     PT Treatment/Interventions  Cryotherapy;Iontophoresis 4mg /ml Dexamethasone;Moist Heat;Ultrasound;Passive range of motion;Therapeutic exercise;Patient/family education;Manual techniques    PT Next Visit Plan  manual and modalities for pain/ROM , HEP review , progress as able     PT Home Exercise Plan  scapula retraction ,  cervical sidebend, reaching behind basket, rockwood    Consulted and Agree with Plan of Care  Patient       Patient will benefit from skilled therapeutic  intervention in order to improve the following deficits and impairments:  Pain, Impaired UE functional use, Decreased activity tolerance, Decreased range of motion, Increased muscle spasms, Postural dysfunction  Visit Diagnosis: Chronic right shoulder pain  Stiffness of right shoulder joint  Cramp and spasm  Abnormal posture     Problem List Patient Active Problem List   Diagnosis Date Noted  . Chronic right shoulder pain 11/27/2017  . Unilateral primary osteoarthritis, left knee 11/27/2017    [name]  PT 01/23/2018, 9:36 AM  [facility] [address] [place], [place], [address] Phone: [phone]   Fax:  [phone]  Name: Jillian Buchanan MRN: [id number] Date of Birth: [birth year]

## 2018-01-25 DIAGNOSIS — Z23 Encounter for immunization: Secondary | ICD-10-CM | POA: Diagnosis not present

## 2018-01-25 DIAGNOSIS — I10 Essential (primary) hypertension: Secondary | ICD-10-CM | POA: Diagnosis not present

## 2018-01-25 DIAGNOSIS — E663 Overweight: Secondary | ICD-10-CM | POA: Diagnosis not present

## 2018-01-25 DIAGNOSIS — E039 Hypothyroidism, unspecified: Secondary | ICD-10-CM | POA: Diagnosis not present

## 2018-01-26 ENCOUNTER — Ambulatory Visit: Payer: Medicare Other | Admitting: Physical Therapy

## 2018-01-26 ENCOUNTER — Encounter: Payer: Self-pay | Admitting: Physical Therapy

## 2018-01-26 DIAGNOSIS — M25511 Pain in right shoulder: Secondary | ICD-10-CM | POA: Diagnosis not present

## 2018-01-26 DIAGNOSIS — R252 Cramp and spasm: Secondary | ICD-10-CM

## 2018-01-26 DIAGNOSIS — R293 Abnormal posture: Secondary | ICD-10-CM

## 2018-01-26 DIAGNOSIS — G8929 Other chronic pain: Secondary | ICD-10-CM

## 2018-01-26 DIAGNOSIS — M25611 Stiffness of right shoulder, not elsewhere classified: Secondary | ICD-10-CM | POA: Diagnosis not present

## 2018-01-26 NOTE — Therapy (Signed)
[place], [place], [address] Phone: [phone]   Fax:  [phone]  Physical Therapy Treatment  Patient Details  Name: Jillian Buchanan MRN: [id number] Date of Birth: 01-14-1933 Referring Provider: Joni Fears, MD   Encounter Date: 01/26/2018  PT End of Session - 01/26/18 0852    Visit Number  3    Number of Visits  12    Date for PT Re-Evaluation  02/23/18    Authorization Type  BCBS    PT Start Time  0849    PT Stop Time  0928    PT Time Calculation (min)  39 min       Past Medical History:  Diagnosis Date  . Breast cancer ([place])   . Cataract   . Hypertension     Past Surgical History:  Procedure Laterality Date  . BREAST LUMPECTOMY  1988  . INTRAOCULAR PROSTHESES INSERTION  8/248/2005    There were no vitals filed for this visit.  Subjective Assessment - 01/26/18 0851    Subjective  I think it's a little better.     Currently in Pain?  Yes    Pain Score  6     Pain Location  Shoulder    Pain Orientation  Right    Pain Descriptors / Indicators  Sharp    Pain Frequency  Intermittent    Aggravating Factors   reaching up, reaching behind, first wake     Pain Relieving Factors  stop activity, hot shower                 No data recorded       [facility] Adult PT Treatment/Exercise - 01/26/18 0001      Shoulder Exercises: Supine   Other Supine Exercises  supine red band horizontal abduction and ER x 10 each     Other Supine Exercises  supine cane AAROM      Shoulder Exercises: Seated   Retraction  10 reps    Other Seated Exercises  upper trap stretch 10 sec x 2 each way       Shoulder Exercises: Standing   Row  15 reps;Theraband    Theraband Level (Shoulder Row)  Level 2 (Red)    Other Standing Exercises  Rockwood red x 10 each       Shoulder Exercises: Pulleys   Flexion  2 minutes      Iontophoresis   Type of Iontophoresis  Dexamethasone    Location  ant RT  shoulder    Dose  1cc    Time  4-6 hours             PT Education - 01/26/18 0920    Education provided  Yes    Education Details  HEP    Person(s) Educated  Patient    Methods  Explanation;Handout    Comprehension  Verbalized understanding       PT Short Term Goals - 01/11/18 1015      PT SHORT TERM GOAL #1   Title  She will be independent with initial HEP    Time  2    Period  Weeks    Status  New      PT SHORT TERM GOAL #2   Title  She will report pain decreased 30% or more with normal home tasks    Time  3    Period  Weeks    Status  New      PT  SHORT TERM GOAL #3   Title  Reaching behind back equal to LT    Time  6    Period  Weeks    Status  Revised      PT SHORT TERM GOAL #4   Title  Abuction active RT shoulder improved to 150 degrees     Time  6    Period  Weeks    Status  New        PT Long Term Goals - 01/11/18 1015      PT LONG TERM GOAL #1   Title  she will report pain decr 50% or more with normal home tasks    Time  6    Period  Weeks    Status  New      PT LONG TERM GOAL #2   Title  She will be independent with all HEP issued    Time  6    Period  Weeks    Status  New      PT LONG TERM GOAL #3   Title  She will be able to lift 3 items of clothes from closet with 1-2 max pain    Time  6    Period  Weeks    Status  New      PT LONG TERM GOAL #4   Title  She will reprot PM pain eliminated    Time  6    Period  Weeks    Status  New      PT LONG TERM GOAL #5   Title  FOTO score improved to  40 % limited or better    Time  6    Period  Weeks    Status  New            Plan - 01/26/18 0913    Clinical Impression Statement  Able to progress with strengthening today. Educated on postural exercises and how posture relates to shoulder/neck pain. Began pulleys and supine cane which she reported felt god so aded to HEP.     PT Next Visit Plan  manual and modalities for pain/ROM , HEP review , progress as able     PT Home  Exercise Plan  scapula retraction ,  cervical sidebend, reaching behind basket, rockwood, supine cane press ups and pullovers     Consulted and Agree with Plan of Care  Patient       Patient will benefit from skilled therapeutic intervention in order to improve the following deficits and impairments:  Pain, Impaired UE functional use, Decreased activity tolerance, Decreased range of motion, Increased muscle spasms, Postural dysfunction  Visit Diagnosis: Chronic right shoulder pain  Stiffness of right shoulder joint  Cramp and spasm  Abnormal posture     Problem List Patient Active Problem List   Diagnosis Date Noted  . Chronic right shoulder pain 11/27/2017  . Unilateral primary osteoarthritis, left knee 11/27/2017    Dorene Ar, PTA 01/26/2018, 9:30 AM  [place], [place], [address] Phone: [phone]   Fax:  [phone]  Name: Jillian Buchanan MRN: [id number] Date of Birth: 08-23-1933

## 2018-01-26 NOTE — Patient Instructions (Signed)
  Cane Exercise: Flexion   Lie on back, holding cane above chest x 10-20 reps. Then, Keeping arms as straight as possible, lower cane toward floor beyond head. Hold __5__ seconds. Repeat _10-20___ times. Do __2__ sessions per day.  http://gt2.exer.us/91   Copyright  VHI. All rights reserved.

## 2018-01-30 ENCOUNTER — Ambulatory Visit: Payer: Medicare Other | Attending: Orthopaedic Surgery

## 2018-01-30 DIAGNOSIS — R252 Cramp and spasm: Secondary | ICD-10-CM | POA: Diagnosis not present

## 2018-01-30 DIAGNOSIS — M25611 Stiffness of right shoulder, not elsewhere classified: Secondary | ICD-10-CM

## 2018-01-30 DIAGNOSIS — M25511 Pain in right shoulder: Secondary | ICD-10-CM | POA: Insufficient documentation

## 2018-01-30 DIAGNOSIS — R293 Abnormal posture: Secondary | ICD-10-CM | POA: Diagnosis not present

## 2018-01-30 DIAGNOSIS — G8929 Other chronic pain: Secondary | ICD-10-CM | POA: Diagnosis not present

## 2018-01-30 NOTE — Therapy (Signed)
[place], [place], [address] Phone: [phone]   Fax:  [phone]  Physical Therapy Treatment  Patient Details  Name: Jillian Buchanan MRN: [id number] Date of Birth: 09/10/1933 Referring Provider: Joni Fears, MD   Encounter Date: 01/30/2018  PT End of Session - 01/30/18 0852    Visit Number  4    Number of Visits  12    Date for PT Re-Evaluation  02/23/18    Authorization Type  BCBS    PT Start Time  [phone]    PT Stop Time  0930    PT Time Calculation (min)  43 min    Activity Tolerance  Patient tolerated treatment well    Behavior During Therapy  [facility] for tasks assessed/performed       Past Medical History:  Diagnosis Date  . Breast cancer ([place])   . Cataract   . Hypertension     Past Surgical History:  Procedure Laterality Date  . BREAST LUMPECTOMY  1988  . INTRAOCULAR PROSTHESES INSERTION  8/248/2005    There were no vitals filed for this visit.  Subjective Assessment - 01/30/18 0851    Subjective  Shoulder feeling much better.   Moving clothes in closet with less pain. Still some pain in bed    Currently in Pain?  No/denies                       [facility] Adult PT Treatment/Exercise - 01/30/18 0001      Shoulder Exercises: Supine   Other Supine Exercises  supine green band horizontal abduction and ER/IR  x 10 each     Other Supine Exercises  supine cane AAROM, bench press, flexion  x15 each, isometric extensions x15      Shoulder Exercises: Pulleys   Flexion  2 minutes      Iontophoresis   Type of Iontophoresis  Dexamethasone    Location  ant RT shoulder    Dose  1cc    Time  4-6 hours      Manual Therapy   Manual Therapy  Soft tissue mobilization;Joint mobilization    Joint Mobilization  distraction and inf glide Gr 2-3    Soft tissue mobilization  pectorals /ant shoulder RT               PT Short Term Goals - 01/11/18 1015      PT SHORT TERM GOAL  #1   Title  She will be independent with initial HEP    Time  2    Period  Weeks    Status  New      PT SHORT TERM GOAL #2   Title  She will report pain decreased 30% or more with normal home tasks    Time  3    Period  Weeks    Status  New      PT SHORT TERM GOAL #3   Title  Reaching behind back equal to LT    Time  6    Period  Weeks    Status  Revised      PT SHORT TERM GOAL #4   Title  Abuction active RT shoulder improved to 150 degrees     Time  6    Period  Weeks    Status  New        PT Long Term Goals - 01/11/18 1015      PT LONG TERM GOAL #1  Title  she will report pain decr 50% or more with normal home tasks    Time  6    Period  Weeks    Status  New      PT LONG TERM GOAL #2   Title  She will be independent with all HEP issued    Time  6    Period  Weeks    Status  New      PT LONG TERM GOAL #3   Title  She will be able to lift 3 items of clothes from closet with 1-2 max pain    Time  6    Period  Weeks    Status  New      PT LONG TERM GOAL #4   Title  She will reprot PM pain eliminated    Time  6    Period  Weeks    Status  New      PT LONG TERM GOAL #5   Title  FOTO score improved to  40 % limited or better    Time  6    Period  Weeks    Status  New            Plan - 01/30/18 2423    Clinical Impression Statement  improveing  and now doing green band . Pain improved but still very tender anterior RT shoulder    PT Treatment/Interventions  Cryotherapy;Iontophoresis 4mg /ml Dexamethasone;Moist Heat;Ultrasound;Passive range of motion;Therapeutic exercise;Patient/family education;Manual techniques    PT Next Visit Plan  manual and modalities for pain/ROM , HEP review , progress as able     PT Home Exercise Plan  scapula retraction ,  cervical sidebend, reaching behind basket, rockwood, supine cane press ups and pullovers     Consulted and Agree with Plan of Care  Patient       Patient will benefit from skilled therapeutic  intervention in order to improve the following deficits and impairments:  Pain, Impaired UE functional use, Decreased activity tolerance, Decreased range of motion, Increased muscle spasms, Postural dysfunction  Visit Diagnosis: Chronic right shoulder pain  Stiffness of right shoulder joint  Cramp and spasm  Abnormal posture     Problem List Patient Active Problem List   Diagnosis Date Noted  . Chronic right shoulder pain 11/27/2017  . Unilateral primary osteoarthritis, left knee 11/27/2017    [name] PT 01/30/2018, 10:13 AM  [facility] [address] [place], [place], [address] Phone: [phone]   Fax:  [phone]  Name: Jillian Buchanan MRN: [id number] Date of Birth: May 03, 1933

## 2018-02-02 ENCOUNTER — Ambulatory Visit: Payer: Medicare Other | Admitting: Physical Therapy

## 2018-02-02 ENCOUNTER — Encounter: Payer: Self-pay | Admitting: Physical Therapy

## 2018-02-02 DIAGNOSIS — M25611 Stiffness of right shoulder, not elsewhere classified: Secondary | ICD-10-CM

## 2018-02-02 DIAGNOSIS — M25511 Pain in right shoulder: Secondary | ICD-10-CM | POA: Diagnosis not present

## 2018-02-02 DIAGNOSIS — G8929 Other chronic pain: Secondary | ICD-10-CM

## 2018-02-02 DIAGNOSIS — R293 Abnormal posture: Secondary | ICD-10-CM | POA: Diagnosis not present

## 2018-02-02 DIAGNOSIS — R252 Cramp and spasm: Secondary | ICD-10-CM

## 2018-02-02 NOTE — Therapy (Signed)
[place], [place], [address] Phone: [phone]   Fax:  [phone]  Physical Therapy Treatment  Patient Details  Name: Jillian Buchanan MRN: [id number] Date of Birth: 1933-02-28 Referring Provider: Joni Fears, MD   Encounter Date: 02/02/2018  PT End of Session - 02/02/18 0849    Visit Number  5    Number of Visits  12    Date for PT Re-Evaluation  02/23/18    Authorization Type  BCBS    PT Start Time  0845    PT Stop Time  0926    PT Time Calculation (min)  41 min       Past Medical History:  Diagnosis Date  . Breast cancer ([place])   . Cataract   . Hypertension     Past Surgical History:  Procedure Laterality Date  . BREAST LUMPECTOMY  1988  . INTRAOCULAR PROSTHESES INSERTION  8/248/2005    There were no vitals filed for this visit.  Subjective Assessment - 02/02/18 0848    Currently in Pain?  Yes    Pain Score  7     Pain Location  Shoulder    Pain Orientation  Right    Pain Descriptors / Indicators  Aching    Pain Type  Chronic pain    Aggravating Factors   weather, rain    Pain Relieving Factors  hot shower, stop activity          [facility] PT Assessment - 02/02/18 0001      AROM   Right Shoulder ABduction  142 Degrees    Left Shoulder Internal Rotation  -- 2 inch difference right vs left                    [facility] Adult PT Treatment/Exercise - 02/02/18 0001      Shoulder Exercises: Supine   Other Supine Exercises  supine green band horizontal abduction and ER  x 10x 2  each       Shoulder Exercises: Standing   Row  20 reps    Theraband Level (Shoulder Row)  Level 3 (Green)    Other Standing Exercises  Rockwood green 10 x 2 each       Shoulder Exercises: Pulleys   Flexion  2 minutes      Shoulder Exercises: ROM/Strengthening   UBE (Upper Arm Bike)  2.5 min forward, 2.5 min backward       Modalities   Modalities  Ultrasound      Ultrasound   Ultrasound  Location  anterior right shoulder    Ultrasound Parameters  1.2 w/cm2 50% x 8 min    Ultrasound Goals  Pain               PT Short Term Goals - 02/02/18 0856      PT SHORT TERM GOAL #1   Title  She will be independent with initial HEP    Time  2    Period  Weeks    Status  On-going      PT SHORT TERM GOAL #2   Title  She will report pain decreased 30% or more with normal home tasks    Baseline  better until today, pain this morning 02/02/2018    Time  3    Period  Weeks    Status  On-going      PT SHORT TERM GOAL #3   Title  Reaching behind back equal to LT  Baseline  2 inch difference, improved from 3 inch difference     Time  6    Period  Weeks    Status  On-going      PT SHORT TERM GOAL #4   Title  Abuction active RT shoulder improved to 150 degrees     Baseline  142 degrees on  02/02/2018    Time  6    Period  Weeks    Status  On-going        PT Long Term Goals - 01/11/18 1015      PT LONG TERM GOAL #1   Title  she will report pain decr 50% or more with normal home tasks    Time  6    Period  Weeks    Status  New      PT LONG TERM GOAL #2   Title  She will be independent with all HEP issued    Time  6    Period  Weeks    Status  New      PT LONG TERM GOAL #3   Title  She will be able to lift 3 items of clothes from closet with 1-2 max pain    Time  6    Period  Weeks    Status  New      PT LONG TERM GOAL #4   Title  She will reprot PM pain eliminated    Time  6    Period  Weeks    Status  New      PT LONG TERM GOAL #5   Title  FOTO score improved to  40 % limited or better    Time  6    Period  Weeks    Status  New            Plan - 02/02/18 1540    Clinical Impression Statement  Pt reports decreased overall pain however she has 7/10 pain this morning possibly due to the weather. Her abduction and IR AROM have improved. Progressing toward STGs. She has a skin reaction to the ionto patch so we did not repeat. Trial of ultrasound  to decrease pain.     PT Next Visit Plan  manual and modalities for pain/ROM , HEP review , progress as able     PT Home Exercise Plan  scapula retraction ,  cervical sidebend, reaching behind basket, rockwood, supine cane press ups and pullovers     Consulted and Agree with Plan of Care  Patient       Patient will benefit from skilled therapeutic intervention in order to improve the following deficits and impairments:  Pain, Impaired UE functional use, Decreased activity tolerance, Decreased range of motion, Increased muscle spasms, Postural dysfunction  Visit Diagnosis: Chronic right shoulder pain  Stiffness of right shoulder joint  Cramp and spasm  Abnormal posture     Problem List Patient Active Problem List   Diagnosis Date Noted  . Chronic right shoulder pain 11/27/2017  . Unilateral primary osteoarthritis, left knee 11/27/2017    Dorene Ar, PTA 02/02/2018, 9:30 AM  [place], [place], [address] Phone: [phone]   Fax:  [phone]  Name: Jillian Buchanan MRN: [id number] Date of Birth: 1933/08/03

## 2018-02-05 ENCOUNTER — Ambulatory Visit: Payer: Medicare Other

## 2018-02-05 DIAGNOSIS — M25511 Pain in right shoulder: Secondary | ICD-10-CM | POA: Diagnosis not present

## 2018-02-05 DIAGNOSIS — R252 Cramp and spasm: Secondary | ICD-10-CM

## 2018-02-05 DIAGNOSIS — M25611 Stiffness of right shoulder, not elsewhere classified: Secondary | ICD-10-CM

## 2018-02-05 DIAGNOSIS — R293 Abnormal posture: Secondary | ICD-10-CM | POA: Diagnosis not present

## 2018-02-05 DIAGNOSIS — G8929 Other chronic pain: Secondary | ICD-10-CM | POA: Diagnosis not present

## 2018-02-05 NOTE — Patient Instructions (Signed)
Behind back stretch with band 30 sec x 2-3 reps  2-3x/day,  Supine ER 90/90 5-10 min with gentle pull.

## 2018-02-05 NOTE — Therapy (Signed)
[place], [place], [address] Phone: [phone]   Fax:  [phone]  Physical Therapy Treatment  Patient Details  Name: Jillian Buchanan MRN: [id number] Date of Birth: 1933/10/20 Referring Provider: Joni Fears, MD   Encounter Date: 02/05/2018  PT End of Session - 02/05/18 0855    Visit Number  6    Number of Visits  12    Date for PT Re-Evaluation  02/23/18    Authorization Type  BCBS    PT Start Time  0850    PT Stop Time  0930    PT Time Calculation (min)  40 min    Activity Tolerance  Patient tolerated treatment well    Behavior During Therapy  [facility] for tasks assessed/performed       Past Medical History:  Diagnosis Date  . Breast cancer ([place])   . Cataract   . Hypertension     Past Surgical History:  Procedure Laterality Date  . BREAST LUMPECTOMY  1988  . INTRAOCULAR PROSTHESES INSERTION  8/248/2005    There were no vitals filed for this visit.  Subjective Assessment - 02/05/18 0856    Subjective  no pain today ., Doing well.     Currently in Pain?  No/denies         [facility] PT Assessment - 02/05/18 0001      AROM   Right Shoulder Flexion  150 Degrees    Right Shoulder ABduction  152 Degrees reports mild pain arc on descent    Right Shoulder External Rotation  83 Degrees may have mis measured last assessment                   [facility] Adult PT Treatment/Exercise - 02/05/18 0001      Shoulder Exercises: Seated   External Rotation  Both;20 reps;Theraband    Theraband Level (Shoulder External Rotation)  Level 3 (Green)      Shoulder Exercises: Pulleys   Flexion  3 minutes      Shoulder Exercises: Stretch   Internal Rotation Stretch  -- behind back 30 sec x 3 instruction for home    External Rotation Stretch  Other (comment);Limitations supine arm on pillows with gentle sustained       Manual Therapy   Manual Therapy  Passive ROM    Joint Mobilization  distraction  and inf glide Gr 2-3    Soft tissue mobilization  pectorals /ant shoulder RT    Passive ROM  RT shoulder ER  to point of tightness 5 reps 60 sec.              PT Education - 02/05/18 0932    Education provided  Yes    Education Details  ER/ IR strech     Person(s) Educated  Patient    Methods  Explanation;Tactile cues;Verbal cues;Handout    Comprehension  Verbalized understanding;Returned demonstration       PT Short Term Goals - 02/02/18 0856      PT SHORT TERM GOAL #1   Title  She will be independent with initial HEP    Time  2    Period  Weeks    Status  On-going      PT SHORT TERM GOAL #2   Title  She will report pain decreased 30% or more with normal home tasks    Baseline  better until today, pain this morning 02/02/2018    Time  3    Period  Weeks    Status  On-going      PT SHORT TERM GOAL #3   Title  Reaching behind back equal to LT    Baseline  2 inch difference, improved from 3 inch difference     Time  6    Period  Weeks    Status  On-going      PT SHORT TERM GOAL #4   Title  Abuction active RT shoulder improved to 150 degrees     Baseline  142 degrees on  02/02/2018    Time  6    Period  Weeks    Status  On-going        PT Long Term Goals - 01/11/18 1015      PT LONG TERM GOAL #1   Title  she will report pain decr 50% or more with normal home tasks    Time  6    Period  Weeks    Status  New      PT LONG TERM GOAL #2   Title  She will be independent with all HEP issued    Time  6    Period  Weeks    Status  New      PT LONG TERM GOAL #3   Title  She will be able to lift 3 items of clothes from closet with 1-2 max pain    Time  6    Period  Weeks    Status  New      PT LONG TERM GOAL #4   Title  She will reprot PM pain eliminated    Time  6    Period  Weeks    Status  New      PT LONG TERM GOAL #5   Title  FOTO score improved to  40 % limited or better    Time  6    Period  Weeks    Status  New            Plan -  02/05/18 0855    Clinical Impression Statement  IR ER minimally decr form LT shoulder . Need to get these looser for decr pain.     PT Treatment/Interventions  Cryotherapy;Iontophoresis 4mg /ml Dexamethasone;Moist Heat;Ultrasound;Passive range of motion;Therapeutic exercise;Patient/family education;Manual techniques    PT Next Visit Plan  manual and modalities for pain/ROM , HEP review , progress as able     PT Home Exercise Plan  scapula retraction ,  cervical sidebend, reaching behind basket, rockwood, supine cane press ups and pullovers     Consulted and Agree with Plan of Care  Patient       Patient will benefit from skilled therapeutic intervention in order to improve the following deficits and impairments:  Pain, Impaired UE functional use, Decreased activity tolerance, Decreased range of motion, Increased muscle spasms, Postural dysfunction  Visit Diagnosis: Chronic right shoulder pain  Stiffness of right shoulder joint  Cramp and spasm  Abnormal posture     Problem List Patient Active Problem List   Diagnosis Date Noted  . Chronic right shoulder pain 11/27/2017  . Unilateral primary osteoarthritis, left knee 11/27/2017   Pearson Forster, PT 02/05/2018, 9:34 AM  [facility] [address] [place], [place], [address] Phone: [phone]   Fax:  [phone]  Name: Jillian Buchanan MRN: [id number] Date of Birth: 08/26/1933

## 2018-02-09 ENCOUNTER — Encounter: Payer: Self-pay | Admitting: Physical Therapy

## 2018-02-09 ENCOUNTER — Ambulatory Visit: Payer: Medicare Other | Admitting: Physical Therapy

## 2018-02-09 DIAGNOSIS — G8929 Other chronic pain: Secondary | ICD-10-CM

## 2018-02-09 DIAGNOSIS — R252 Cramp and spasm: Secondary | ICD-10-CM | POA: Diagnosis not present

## 2018-02-09 DIAGNOSIS — R293 Abnormal posture: Secondary | ICD-10-CM

## 2018-02-09 DIAGNOSIS — M25511 Pain in right shoulder: Secondary | ICD-10-CM | POA: Diagnosis not present

## 2018-02-09 DIAGNOSIS — M25611 Stiffness of right shoulder, not elsewhere classified: Secondary | ICD-10-CM

## 2018-02-09 NOTE — Therapy (Signed)
[place], [place], [address] Phone: [phone]   Fax:  [phone]  Physical Therapy Treatment  Patient Details  Name: Jillian Buchanan MRN: [id number] Date of Birth: [birth year] Referring Provider: Joni Fears, MD   Encounter Date: 02/09/2018  PT End of Session - 02/09/18 0854    Visit Number  7    Number of Visits  12    Date for PT Re-Evaluation  02/23/18    Authorization Type  BCBS    PT Start Time  [phone]    PT Stop Time  0930    PT Time Calculation (min)  38 min       Past Medical History:  Diagnosis Date  . Breast cancer ([place])   . Cataract   . Hypertension     Past Surgical History:  Procedure Laterality Date  . BREAST LUMPECTOMY  1988  . INTRAOCULAR PROSTHESES INSERTION  8/248/2005    There were no vitals filed for this visit.  Subjective Assessment - 02/09/18 0853    Subjective  A little bit of pain.     Currently in Pain?  Yes    Pain Score  5     Pain Location  Shoulder    Pain Orientation  Right    Pain Descriptors / Indicators  Aching    Aggravating Factors   weather , rain    Pain Relieving Factors  shower , move around          [facility] PT Assessment - 02/09/18 0001      AROM   Right Shoulder Flexion  150 Degrees    Left Shoulder Internal Rotation  -- 1 inch difference right vs left                    [facility] Adult PT Treatment/Exercise - 02/09/18 0001      Shoulder Exercises: Supine   Other Supine Exercises  supine green band horizontal abduction and ER  x 10x 2  each       Shoulder Exercises: Standing   Row  20 reps    Theraband Level (Shoulder Row)  Level 3 (Green)    Other Standing Exercises  Rockwood green 10 x 2 each       Shoulder Exercises: Pulleys   Flexion  3 minutes      Shoulder Exercises: ROM/Strengthening   UBE (Upper Arm Bike)  2.5 min forward, 2.5 min backward       Shoulder Exercises: Stretch   Internal Rotation Stretch  --  behind back 30 sec x 3 instruction for home    External Rotation Stretch  Other (comment);Limitations supine arm on pillows with gentle sustained       Manual Therapy   Joint Mobilization  distraction and inf glide Gr 2-3    Passive ROM  all planes                PT Short Term Goals - 02/09/18 5462      PT SHORT TERM GOAL #1   Title  She will be independent with initial HEP    Time  2    Period  Weeks    Status  Achieved      PT SHORT TERM GOAL #2   Title  She will report pain decreased 30% or more with normal home tasks    Baseline  40% decrease    Time  3    Period  Weeks  Status  On-going      PT SHORT TERM GOAL #3   Title  Reaching behind back equal to LT    Baseline  1 inch difference improved from 3 inch difference.     Time  6    Period  Weeks    Status  On-going      PT SHORT TERM GOAL #4   Title  Abuction active RT shoulder improved to 150 degrees     Baseline  150 on 02/05/18 and 02/09/2018     Time  6    Period  Weeks    Status  Achieved        PT Long Term Goals - 02/09/18 0944      PT LONG TERM GOAL #1   Title  she will report pain decr 50% or more with normal home tasks    Baseline  40%     Time  6    Period  Weeks    Status  On-going      PT LONG TERM GOAL #2   Title  She will be independent with all HEP issued    Time  6    Period  Weeks    Status  On-going      PT LONG TERM GOAL #3   Title  She will be able to lift 3 items of clothes from closet with 1-2 max pain    Baseline  reports reaching better into closet, need to check 3 items     Time  6    Period  Weeks    Status  Unable to assess      PT LONG TERM GOAL #4   Title  She will reprot PM pain eliminated    Baseline  Better    Time  6    Period  Weeks    Status  On-going      PT LONG TERM GOAL #5   Title  FOTO score improved to  40 % limited or better    Time  6    Period  Weeks    Status  Unable to assess            Plan - 02/09/18 0939    Clinical  Impression Statement  Pt reports 40% improvement in pain. Pain less at night. Pain increases with rainy weather, 5/10 today. Her ROM has improved. She is reaching into closet with less difficulty. STG#1, #4 met. She has 2 appointments next week however she may want to dischrage next visit. (her second visit is late and she preferse early appts). Will check LTGS and FOTO next visit, probable DC.     PT Next Visit Plan  FOTO, GOALS, DC verses 1 more visit?     PT Home Exercise Plan  scapula retraction ,  cervical sidebend, reaching behind basket, rockwood, supine cane press ups and pullovers     Consulted and Agree with Plan of Care  Patient       Patient will benefit from skilled therapeutic intervention in order to improve the following deficits and impairments:  Pain, Impaired UE functional use, Decreased activity tolerance, Decreased range of motion, Increased muscle spasms, Postural dysfunction  Visit Diagnosis: Chronic right shoulder pain  Stiffness of right shoulder joint  Cramp and spasm  Abnormal posture     Problem List Patient Active Problem List   Diagnosis Date Noted  . Chronic right shoulder pain 11/27/2017  . Unilateral primary osteoarthritis, left knee 11/27/2017  Dorene Ar, [place] 02/09/2018, 9:48 AM  [place], [place], [address] Phone: [phone]   Fax:  [phone]  Name: Jillian Buchanan MRN: [id number] Date of Birth: 1932-12-23

## 2018-02-13 ENCOUNTER — Ambulatory Visit: Payer: Medicare Other

## 2018-02-13 DIAGNOSIS — M25611 Stiffness of right shoulder, not elsewhere classified: Secondary | ICD-10-CM

## 2018-02-13 DIAGNOSIS — G8929 Other chronic pain: Secondary | ICD-10-CM | POA: Diagnosis not present

## 2018-02-13 DIAGNOSIS — R293 Abnormal posture: Secondary | ICD-10-CM

## 2018-02-13 DIAGNOSIS — M25511 Pain in right shoulder: Secondary | ICD-10-CM | POA: Diagnosis not present

## 2018-02-13 DIAGNOSIS — R252 Cramp and spasm: Secondary | ICD-10-CM

## 2018-02-13 NOTE — Therapy (Signed)
[place], [place], [address] Phone: [phone]   Fax:  [phone]  Physical Therapy Treatment/Discharge  Patient Details  Name: Jillian Buchanan MRN: [id number] Date of Birth: 1933/02/17 Referring Provider: Joni Fears, MD   Encounter Date: 02/13/2018  PT End of Session - 02/13/18 0847    Visit Number  8    Number of Visits  12    Date for PT Re-Evaluation  02/23/18    Authorization Type  BCBS    PT Start Time  [phone]    PT Stop Time  0930    PT Time Calculation (min)  44 min    Activity Tolerance  Patient tolerated treatment well    Behavior During Therapy  [facility] for tasks assessed/performed       Past Medical History:  Diagnosis Date  . Breast cancer ([place])   . Cataract   . Hypertension     Past Surgical History:  Procedure Laterality Date  . BREAST LUMPECTOMY  1988  . INTRAOCULAR PROSTHESES INSERTION  8/248/2005    There were no vitals filed for this visit.  Subjective Assessment - 02/13/18 0852    Subjective  RT shoulder much better .  Weekend achy Sunday.      Currently in Pain?  No/denies         [facility] PT Assessment - 02/13/18 0001      Observation/Other Assessments   Focus on Therapeutic Outcomes (FOTO)   44%      AROM   Right Shoulder Flexion  150 Degrees    Right Shoulder ABduction  152 Degrees    Right Shoulder External Rotation  85 Degrees      Strength   Overall Strength Comments  WNL bilaterally with  pain tesitng flex/abduct/ER                    [facility] Adult PT Treatment/Exercise - 02/13/18 0001      Self-Care   Self-Care  Other Self-Care Comments    Other Self-Care Comments   reviewed and discussed HEP and progression . Need to watch to no reach with shoulder in positions that might put tissue into tension.     Discussed how to assess pains that may have no negativew relation to problem and ones that might need MD assessment.                 PT Short Term Goals - 02/09/18 3845      PT SHORT TERM GOAL #1   Title  She will be independent with initial HEP    Time  2    Period  Weeks    Status  Achieved      PT SHORT TERM GOAL #2   Title  She will report pain decreased 30% or more with normal home tasks    Baseline  40% decrease    Time  3    Period  Weeks    Status  On-going      PT SHORT TERM GOAL #3   Title  Reaching behind back equal to LT    Baseline  1 inch difference improved from 3 inch difference.     Time  6    Period  Weeks    Status  On-going      PT SHORT TERM GOAL #4   Title  Abuction active RT shoulder improved to 150 degrees     Baseline  150 on 02/05/18 and 02/09/2018  Time  6    Period  Weeks    Status  Achieved        PT Long Term Goals - 02/13/18 0901      PT LONG TERM GOAL #1   Title  she will report pain decr 50% or more with normal home tasks    Baseline  75%    Status  Achieved      PT LONG TERM GOAL #2   Title  She will be independent with all HEP issued    Status  Achieved      PT LONG TERM GOAL #3   Title  She will be able to lift 3 items of clothes from closet with 1-2 max pain    Status  Achieved      PT LONG TERM GOAL #4   Title  She will report PM pain eliminated    Status  Achieved      PT LONG TERM GOAL #5   Title  FOTO score improved to  40 % limited or better    Baseline  44%    Status  Partially Met            Plan - 02/13/18 0847    PT Treatment/Interventions  Cryotherapy;Iontophoresis 81m/ml Dexamethasone;Moist Heat;Ultrasound;Passive range of motion;Therapeutic exercise;Patient/family education;Manual techniques    PT Home Exercise Plan  scapula retraction ,  cervical sidebend, reaching behind basket, rockwood, supine cane press ups and pullovers        Patient will benefit from skilled therapeutic intervention in order to improve the following deficits and impairments:  Pain, Impaired UE functional use, Decreased activity  tolerance, Decreased range of motion, Increased muscle spasms, Postural dysfunction  Visit Diagnosis: Chronic right shoulder pain  Stiffness of right shoulder joint  Cramp and spasm  Abnormal posture     Problem List Patient Active Problem List   Diagnosis Date Noted  . Chronic right shoulder pain 11/27/2017  . Unilateral primary osteoarthritis, left knee 11/27/2017    CDarrel Hoover4/16/2019, 9:33 AM  [facility] [address]Phone: [phone]  Fax:  [phone] Name: Jillian FRANCAMRN: [id number] of Birth: 1Jan 11, 1934 PHYSICAL THERAPY DISCHARGE SUMMARY  Visits from Start of Care: 8  Current functional level related to goals / functional outcomes: See above    Remaining deficits: Mild occasional pain   Education / Equipment: HEP Plan: Patient agrees to discharge.  Patient goals were partially met. Patient is being discharged due to being pleased with the current functional level.  ?????

## 2018-02-15 ENCOUNTER — Ambulatory Visit: Payer: Medicare Other | Admitting: Physical Therapy

## 2018-02-17 ENCOUNTER — Other Ambulatory Visit (INDEPENDENT_AMBULATORY_CARE_PROVIDER_SITE_OTHER): Payer: Self-pay | Admitting: Orthopaedic Surgery

## 2018-02-20 ENCOUNTER — Other Ambulatory Visit (INDEPENDENT_AMBULATORY_CARE_PROVIDER_SITE_OTHER): Payer: Self-pay | Admitting: Radiology

## 2018-02-20 MED ORDER — DICLOFENAC SODIUM 1 % TD GEL: 2.0000 g | Freq: Four times a day (QID) | TRANSDERMAL | 2 refills | Status: DC

## 2018-02-20 NOTE — Telephone Encounter (Signed)
Sent in voltaren gel to pharmacy

## 2018-02-20 NOTE — Telephone Encounter (Signed)
Please advise 

## 2018-02-20 NOTE — Telephone Encounter (Signed)
OK TO PRESCRIBE

## 2018-02-20 NOTE — Telephone Encounter (Signed)
Sent in refill request.

## 2018-06-14 DIAGNOSIS — H04123 Dry eye syndrome of bilateral lacrimal glands: Secondary | ICD-10-CM | POA: Diagnosis not present

## 2018-06-14 DIAGNOSIS — H43813 Vitreous degeneration, bilateral: Secondary | ICD-10-CM | POA: Diagnosis not present

## 2018-06-14 DIAGNOSIS — H3500 Unspecified background retinopathy: Secondary | ICD-10-CM | POA: Diagnosis not present

## 2018-06-14 DIAGNOSIS — H5213 Myopia, bilateral: Secondary | ICD-10-CM | POA: Diagnosis not present

## 2018-06-15 ENCOUNTER — Encounter (INDEPENDENT_AMBULATORY_CARE_PROVIDER_SITE_OTHER): Payer: Self-pay | Admitting: Orthopaedic Surgery

## 2018-06-15 ENCOUNTER — Ambulatory Visit (INDEPENDENT_AMBULATORY_CARE_PROVIDER_SITE_OTHER): Payer: Medicare Other | Admitting: Orthopaedic Surgery

## 2018-06-15 VITALS — BP 143/77 | HR 79 | Ht 64.0 in | Wt 235.0 lb

## 2018-06-15 DIAGNOSIS — M25562 Pain in left knee: Secondary | ICD-10-CM

## 2018-06-15 DIAGNOSIS — M1712 Unilateral primary osteoarthritis, left knee: Secondary | ICD-10-CM

## 2018-06-15 DIAGNOSIS — G8929 Other chronic pain: Secondary | ICD-10-CM | POA: Diagnosis not present

## 2018-06-15 MED ORDER — METHYLPREDNISOLONE ACETATE 40 MG/ML IJ SUSP
80.0000 mg | INTRAMUSCULAR | Status: AC | PRN
  Administered 2018-06-15: 80 mg

## 2018-06-15 MED ORDER — BUPIVACAINE HCL 0.5 % IJ SOLN
2.0000 mL | INTRAMUSCULAR | Status: AC | PRN
  Administered 2018-06-15: 2 mL via INTRA_ARTICULAR

## 2018-06-15 MED ORDER — LIDOCAINE HCL 1 % IJ SOLN
2.0000 mL | INTRAMUSCULAR | Status: AC | PRN
  Administered 2018-06-15: 2 mL

## 2018-06-15 NOTE — Progress Notes (Signed)
Office Visit Note   Patient: Jillian Buchanan           Date of Birth: 1933/08/15           MRN: [id number] Visit Date: 06/15/2018              Requested by: Seward Carol, MD [address] [place], [place] [address] PCP: Seward Carol, MD   Assessment & Plan: Visit Diagnoses:  1. Chronic pain of left knee   2. Unilateral primary osteoarthritis, left knee     Plan: Chronic osteoarthritis left knee.  Will check cortisone and plan to see her back as needed  Follow-Up Instructions: Return if symptoms worsen or fail to improve.   Orders:  Orders Placed This Encounter  Procedures  . Large Joint Inj: L knee   No orders of the defined types were placed in this encounter.     Procedures: Large Joint Inj: L knee on 06/15/2018 4:24 PM Indications: pain and diagnostic evaluation Details: 25 G 1.5 in needle, anteromedial approach  Arthrogram: No  Medications: 2 mL lidocaine 1 %; 2 mL bupivacaine 0.5 %; 80 mg methylPREDNISolone acetate 40 MG/ML Procedure, treatment alternatives, risks and benefits explained, specific risks discussed. Consent was given by the patient. Patient was prepped and draped in the usual sterile fashion.       Clinical Data: No additional findings.   Subjective: Chief Complaint  Patient presents with  . Follow-up    L KNEE PAIN/SWELLING OFF AND ON FOR 2 YRS GETTING WORSE, OVER THE WEEKEND HAD MUSCLE STIFFNESS AND A PULLING SENSATION AND NUMBNESS IN BACK OF LEG MUSCLE LAST KNEE INJECTION 06/27/18  Has established diagnosis of osteoarthritis left knee.  Has had prior cortisone injections with good relief.  Has had recent exacerbation of her knee pain  HPI  Review of Systems  Constitutional: Positive for fatigue. Negative for fever.  HENT: Negative for ear pain.   Eyes: Negative for pain.  Respiratory: Positive for shortness of breath. Negative for cough.   Cardiovascular: Positive for leg swelling.  Gastrointestinal: Negative for  constipation and diarrhea.  Genitourinary: Negative for difficulty urinating.  Musculoskeletal: Positive for back pain. Negative for neck pain.  Skin: Negative for rash.  Allergic/Immunologic: Negative for food allergies.  Neurological: Positive for weakness and numbness.  Hematological: Bruises/bleeds easily.  Psychiatric/Behavioral: Negative for sleep disturbance.     Objective: Vital Signs: BP (!) 143/77 (BP Location: Right Arm, Patient Position: Sitting, Cuff Size: Normal)   Pulse 79   Ht 5\' 4"  (1.626 m)   Wt 235 lb (106.6 kg)   BMI 40.34 kg/m   Physical Exam  Constitutional: She is oriented to person, place, and time. She appears well-developed and well-nourished.  HENT:  Mouth/Throat: Oropharynx is clear and moist.  Eyes: Pupils are equal, round, and reactive to light. EOM are normal.  Pulmonary/Chest: Effort normal.  Neurological: She is alert and oriented to person, place, and time.  Skin: Skin is warm and dry.  Psychiatric: She has a normal mood and affect. Her behavior is normal.    Ortho Exam awake alert and oriented x3.  Comfortable sitting.  Large knees with a BMI of 40.  Predominant medial joint pain left knee.  Full extension.  Possibly small effusion but needs a large.  Flexed about 95 to 100 degrees without instability.  Minimal patellar crepitation.  Mild bilateral nonpitting ankle edema. Specialty Comments:  No specialty comments available.  Imaging: No results found.   [place]  History: Patient Active Problem List   Diagnosis Date Noted  . Chronic right shoulder pain 11/27/2017  . Unilateral primary osteoarthritis, left knee 11/27/2017   Past Medical History:  Diagnosis Date  . Breast cancer ([place])   . Cataract   . Hypertension     History reviewed. No pertinent family history.  Past Surgical History:  Procedure Laterality Date  . BREAST LUMPECTOMY  1988  . INTRAOCULAR PROSTHESES INSERTION  8/248/2005   Social History   Occupational History  .  Not on file  Tobacco Use  . Smoking status: Never Smoker  . Smokeless tobacco: Never Used  Substance and Sexual Activity  . Alcohol use: No  . Drug use: No  . Sexual activity: Never

## 2018-07-25 DIAGNOSIS — G479 Sleep disorder, unspecified: Secondary | ICD-10-CM | POA: Diagnosis not present

## 2018-07-25 DIAGNOSIS — Z853 Personal history of malignant neoplasm of breast: Secondary | ICD-10-CM | POA: Diagnosis not present

## 2018-07-25 DIAGNOSIS — Z6838 Body mass index (BMI) 38.0-38.9, adult: Secondary | ICD-10-CM | POA: Diagnosis not present

## 2018-07-25 DIAGNOSIS — M25552 Pain in left hip: Secondary | ICD-10-CM | POA: Diagnosis not present

## 2018-07-25 DIAGNOSIS — E039 Hypothyroidism, unspecified: Secondary | ICD-10-CM | POA: Diagnosis not present

## 2018-07-25 DIAGNOSIS — R5383 Other fatigue: Secondary | ICD-10-CM | POA: Diagnosis not present

## 2018-07-25 DIAGNOSIS — I10 Essential (primary) hypertension: Secondary | ICD-10-CM | POA: Diagnosis not present

## 2019-01-01 DIAGNOSIS — E039 Hypothyroidism, unspecified: Secondary | ICD-10-CM | POA: Diagnosis not present

## 2019-01-01 DIAGNOSIS — I10 Essential (primary) hypertension: Secondary | ICD-10-CM | POA: Diagnosis not present

## 2019-01-01 DIAGNOSIS — Z23 Encounter for immunization: Secondary | ICD-10-CM | POA: Diagnosis not present

## 2019-01-01 DIAGNOSIS — Z0000 Encounter for general adult medical examination without abnormal findings: Secondary | ICD-10-CM | POA: Diagnosis not present

## 2019-01-01 DIAGNOSIS — Z1389 Encounter for screening for other disorder: Secondary | ICD-10-CM | POA: Diagnosis not present

## 2019-01-01 DIAGNOSIS — M25473 Effusion, unspecified ankle: Secondary | ICD-10-CM | POA: Diagnosis not present

## 2019-05-21 ENCOUNTER — Ambulatory Visit: Payer: Self-pay

## 2019-05-21 ENCOUNTER — Ambulatory Visit (INDEPENDENT_AMBULATORY_CARE_PROVIDER_SITE_OTHER): Payer: Medicare Other | Admitting: Orthopedic Surgery

## 2019-05-21 ENCOUNTER — Encounter: Payer: Self-pay | Admitting: Orthopedic Surgery

## 2019-05-21 ENCOUNTER — Other Ambulatory Visit: Payer: Self-pay

## 2019-05-21 VITALS — BP 129/85 | HR 91 | Resp 18 | Ht 65.0 in | Wt 235.0 lb

## 2019-05-21 DIAGNOSIS — G8929 Other chronic pain: Secondary | ICD-10-CM | POA: Diagnosis not present

## 2019-05-21 DIAGNOSIS — M25561 Pain in right knee: Secondary | ICD-10-CM | POA: Diagnosis not present

## 2019-05-21 DIAGNOSIS — M25562 Pain in left knee: Secondary | ICD-10-CM | POA: Diagnosis not present

## 2019-05-21 DIAGNOSIS — M25512 Pain in left shoulder: Secondary | ICD-10-CM | POA: Diagnosis not present

## 2019-05-21 DIAGNOSIS — M79645 Pain in left finger(s): Secondary | ICD-10-CM

## 2019-05-21 MED ORDER — BUPIVACAINE HCL 0.25 % IJ SOLN
2.0000 mL | INTRAMUSCULAR | Status: AC | PRN
  Administered 2019-05-21: 2 mL via INTRA_ARTICULAR

## 2019-05-21 MED ORDER — METHYLPREDNISOLONE ACETATE 40 MG/ML IJ SUSP
80.0000 mg | INTRAMUSCULAR | Status: AC | PRN
  Administered 2019-05-21: 80 mg via INTRA_ARTICULAR

## 2019-05-21 MED ORDER — LIDOCAINE HCL 1 % IJ SOLN
2.0000 mL | INTRAMUSCULAR | Status: AC | PRN
  Administered 2019-05-21: 2 mL

## 2019-05-21 NOTE — Progress Notes (Signed)
Office Visit Note   Patient: Jillian Buchanan           Date of Birth: 07/05/1933           MRN: [id number] Visit Date: 05/21/2019              Requested by: Seward Carol, MD [address] [place],  [place] [address] PCP: Seward Carol, MD   Assessment & Plan: Visit Diagnoses:  1. Chronic pain of both knees   2. Chronic pain of left thumb   3. Chronic left shoulder pain     Plan:  #1: Corticosteroid injection to be right knee was performed without difficulty.  She did have improvement in her symptoms postinjection #2: She is going to get some Voltaren gel which she will do to her shoulder and the left knee and the left wrist. #3: Certainly we can inject her wrist and shoulder in the future.  May need to consider MRI scanning if indicated.  We will follow her back up for further evaluation in the future.  Follow-Up Instructions: Return in about 2 weeks (around 06/04/2019).    Face-to-face time spent with patient was greater than 40 minutes.  Greater than 50% of the time was spent in counseling and coordination of care. Orders:  Orders Placed This Encounter  Procedures  . XR KNEE 3 VIEW LEFT  . XR KNEE 3 VIEW RIGHT  . XR Shoulder Left  . XR Hand Complete Left  . Ambulatory referral to Physical Therapy   No orders of the defined types were placed in this encounter.     Procedures: Large Joint Inj: R knee on 05/21/2019 1:49 PM Indications: pain and diagnostic evaluation Details: 25 G 1.5 in needle, anteromedial approach  Arthrogram: No  Medications: 2 mL lidocaine 1 %; 80 mg methylPREDNISolone acetate 40 MG/ML; 2 mL bupivacaine 0.25 % Outcome: tolerated well, no immediate complications Procedure, treatment alternatives, risks and benefits explained, specific risks discussed. Consent was given by the patient. Immediately prior to procedure a time out was called to verify the correct patient, procedure, equipment, support staff and site/side marked as required.  Patient was prepped and draped in the usual sterile fashion.       Clinical Data: No additional findings.   Subjective: Chief Complaint  Patient presents with  . Left Shoulder - Pain  . Left Knee - Pain  . Right Knee - Pain  . Hand Pain    Left thumb pain   HPI Jillian Buchanan is a 85 year old female who presents with left shoulder pain, bilateral knee pain and left wrist pain.  She has had pain in the left shoulder x 2 months. She has not had an injury. She has not had left shoulder surgery. She is not diabetic. She is having limited range of motion with more difficulty lifting her arm. She is having weakness,  numbness, difficulty sleeping at night. She alternates Tylenol one day and Aleve the next day which helps.  She is having bilateral knee pain x 2 months worsening over 6 weeks. She has not had an injury. She has not had surgery of the knees. She has popping, clicking, grinding noises, stiffness, weakness, swelling, stiffness and  difficulty walking.  She has left thumb pain off/on x 3 months. She has not had an injury. She has not had surgery to the left wrist. She has some swelling, tenderness and weakness.     Review of Systems  Constitutional: Positive for  fatigue.  HENT: Negative for trouble swallowing.   Eyes: Negative for pain.  Respiratory: Positive for shortness of breath.   Cardiovascular: Positive for leg swelling.  Gastrointestinal: Negative for constipation.  Endocrine: Negative for cold intolerance.  Genitourinary: Negative for difficulty urinating.  Musculoskeletal: Positive for gait problem and joint swelling.  Skin: Positive for rash.  Allergic/Immunologic: Negative for food allergies.  Neurological: Positive for weakness and numbness.  Hematological: Does not bruise/bleed easily.  Psychiatric/Behavioral: Positive for sleep disturbance.     Objective: Vital Signs: BP 129/85 (BP Location: Right Arm, Patient Position: Sitting, Cuff Size: Normal)    Pulse 91   Resp 18   Ht 5\' 5"  (1.651 m)   Wt 235 lb (106.6 kg)   BMI 39.11 kg/m   Physical Exam Constitutional:      Appearance: She is well-developed.  Eyes:     Pupils: Pupils are equal, round, and reactive to light.  Pulmonary:     Effort: Pulmonary effort is normal.  Skin:    General: Skin is warm and dry.  Neurological:     Mental Status: She is alert and oriented to person, place, and time.  Psychiatric:        Behavior: Behavior normal.     Ortho Exam  Exam today reveals positive empty can test in the left shoulder.  She does have limitation range of motion.  Appears to have fairly good strength.  Tender to palpation over the first [facility] joint with positive grind test.  She has some difficulty making a fist secondary to pain at the first [facility].  She is neurovascular intact distally.    Left knee exam today reveals marked adiposity around the knee.  Can't tell she does have an effusion secondary to exogenous obesity..  She does have some fine crepitance with range of motion.  Tender over the lateral joint line more than the medial.  Ranges a little shy of full extension to about 90 degrees flexion.  Right knee exam very similar to the left but is more painful.  Again unable to tell she has an effusion.  She does have some crepitus with range of motion.  Tender over both joint lines.  Ranges from again near full extension to about 90 degrees.  Specialty Comments:  No specialty comments available.  Imaging: Xr Hand Complete Left  Result Date: 05/21/2019 Three-view x-ray of the left hand reveals significant first [facility] osteoarthritis.  Numerous cysts are noted.  No joint space at the first [facility].  It is translated radially first metacarpal.  Xr Knee 3 View Left  Result Date: 05/21/2019 Three-view x-ray of the left knee reveals marked degenerative changes more at the lateral joint space.  She has peaked intercondylar spines.  Osteophytes noted lateral femur and proximal tibia.   She does have some sclerosing more of the lateral tibial plateau.  Cystic changes are noted in the lateral tibial plateau also.  Some degenerative changes in the patellofemoral joint is is also noted with periarticular spurring.  Xr Knee 3 View Right  Result Date: 05/21/2019 3 view x-ray of the right knee reveals lateral joint space narrowing.  Peaking of the intercondylar spine.  Bone islands are noted in the proximal tibia.  She does have some patellofemoral degenerative changes with lateral patellofemoral joint sclerosing and narrowing.  Xr Shoulder Left  Result Date: 05/21/2019 4 view x-ray of the left shoulder reveals a type II acromion.  She has some AC degenerative changes noted.  Staples are noted along  the thorax from a previous mastectomy.  She has some cystic changes in the acromion.  There is also noted some cysts in the humeral head and proximal shaft.  More in the subcapital area.  Does appear to have a little bit of narrowing of the glenoid with some periarticular spurring noted.    PMFS History: Current Outpatient Medications  Medication Sig Dispense Refill  . acetaminophen (TYLENOL) 325 MG tablet     . ALPRAZolam (XANAX) 0.5 MG tablet TAKE 1 TABLET BY MOUTH EVERY DAY AS NEEDED FOR ANXIETY  3  . amLODipine (NORVASC) 5 MG tablet Take 5 mg by mouth daily.      Marland Kitchen aspirin 81 MG EC tablet TAKE ONE (1) TABLET EACH DAY  3  . bumetanide (BUMEX) 0.5 MG tablet Take 0.5 mg by mouth daily.      . diclofenac sodium (VOLTAREN) 1 % GEL APPLY 2 GRAMS TO AFFECTED AREA 4 TIMES A DAY 300 g 2  . hydroquinone 4 % cream AS DIRECTED TO AFFECTED AREA TWICE A DAY AS NEEDED EXTERNALLY    . levothyroxine (SYNTHROID, LEVOTHROID) 88 MCG tablet     . losartan (COZAAR) 100 MG tablet     . Losartan Potassium-HCTZ (HYZAAR PO) Take by mouth daily.      . naproxen sodium (ALEVE) 220 MG tablet     . triamcinolone cream (KENALOG) 0.5 % APPLY TO AFFECTED AREA OF LEG TWICE A DAY AS NEEDED  3  . Celecoxib  (CELEBREX PO) Take by mouth as needed.      . diclofenac sodium (VOLTAREN) 1 % GEL Apply 2 g topically 4 (four) times daily. (Patient not taking: Reported on 05/21/2019) 3 Tube 2   No current facility-administered medications for this visit.     Patient Active Problem List   Diagnosis Date Noted  . Chronic right shoulder pain 11/27/2017  . Unilateral primary osteoarthritis, left knee 11/27/2017   Past Medical History:  Diagnosis Date  . Breast cancer ([place])   . Cataract   . Hypertension     History reviewed. No pertinent family history.  Past Surgical History:  Procedure Laterality Date  . ABDOMINAL HYSTERECTOMY    . BREAST LUMPECTOMY  1988  . INTRAOCULAR PROSTHESES INSERTION  8/248/2005   Social History   Occupational History  . Not on file  Tobacco Use  . Smoking status: Never Smoker  . Smokeless tobacco: Never Used  Substance and Sexual Activity  . Alcohol use: No  . Drug use: No  . Sexual activity: Never

## 2019-05-30 ENCOUNTER — Other Ambulatory Visit: Payer: Self-pay

## 2019-05-30 ENCOUNTER — Ambulatory Visit: Payer: Medicare Other | Attending: Orthopedic Surgery | Admitting: Physical Therapy

## 2019-05-30 ENCOUNTER — Encounter: Payer: Self-pay | Admitting: Physical Therapy

## 2019-05-30 DIAGNOSIS — M25612 Stiffness of left shoulder, not elsewhere classified: Secondary | ICD-10-CM | POA: Diagnosis present

## 2019-05-30 DIAGNOSIS — R293 Abnormal posture: Secondary | ICD-10-CM | POA: Diagnosis present

## 2019-05-30 DIAGNOSIS — M6281 Muscle weakness (generalized): Secondary | ICD-10-CM | POA: Diagnosis present

## 2019-05-30 DIAGNOSIS — M25512 Pain in left shoulder: Secondary | ICD-10-CM | POA: Insufficient documentation

## 2019-05-30 DIAGNOSIS — G8929 Other chronic pain: Secondary | ICD-10-CM | POA: Diagnosis present

## 2019-05-30 NOTE — Therapy (Signed)
[facility] [place], [place], [address] Phone: [phone]   Fax:  [phone]  Physical Therapy Evaluation  Patient Details  Name: Jillian Buchanan MRN: [id number] Date of Birth: [birth year] Referring Provider (PT): Cherylann Ratel, [place]   Encounter Date: 05/30/2019  PT End of Session - 05/30/19 0934    Visit Number  1    Number of Visits  13    Date for PT Re-Evaluation  07/11/19    Authorization Type  MCR: KX mod by 15th visit, progress note at 10th visit    PT Start Time  0934    PT Stop Time  1018    PT Time Calculation (min)  44 min    Activity Tolerance  Patient tolerated treatment well    Behavior During Therapy  [facility] for tasks assessed/performed       Past Medical History:  Diagnosis Date  . Anxiety    self reported  . Breast cancer ([place])   . Cataract   . Hypertension     Past Surgical History:  Procedure Laterality Date  . ABDOMINAL HYSTERECTOMY    . BREAST LUMPECTOMY  1988  . INTRAOCULAR PROSTHESES INSERTION  8/248/2005    There were no vitals filed for this visit.   Subjective Assessment - 05/30/19 0940    Subjective  pt is a 85 y.o F with CC of Chronic L shoulder pain that started about 2-3 months with specific MOI. She reports pain started in the l side of the upper trap and is now inthe lateral aspect of the upper arm. pain fluctuates but can increase with movements. She reports having hx or L shoulder pain which came to PT and improvement last year.    Limitations  Lifting    How long can you sit comfortably?  unlimited    How long can you stand comfortably?  20 min    How long can you walk comfortably?  20 min    Diagnostic tests  X-ray at MD's office    Patient Stated Goals  strengthening the arm, increase grip, decrease pain    Currently in Pain?  Yes    Pain Score  0-No pain   at worst 10/10   Pain Location  Shoulder    Pain Orientation  Left;Lateral;Proximal    Pain Descriptors  / Indicators  Aching;Shooting;Sharp    Pain Type  Chronic pain    Pain Onset  More than a month ago    Pain Frequency  Intermittent    Aggravating Factors   movement, raise the arm above the head    Pain Relieving Factors  getting it moving, cream    Effect of Pain on Daily Activities  limited ROM         [facility] PT Assessment - 05/30/19 0934      Assessment   Medical Diagnosis  Chronic left shoulder pain     Referring Provider (PT)  Cherylann Ratel, PA-C    Onset Date/Surgical Date  --   2-3 months   Hand Dominance  Right    Next MD Visit  06/04/2019    Prior Therapy  yes   for L shoulder     Precautions   Precautions  None      Restrictions   Weight Bearing Restrictions  No      Balance Screen   Has the patient fallen in the past 6 months  No    Has the patient had a  decrease in activity level because of a fear of falling?   No    Is the patient reluctant to leave their home because of a fear of falling?   No      Home Film/video editor residence    Living Arrangements  Spouse/significant other    Available Help at Discharge  Family    Type of [place] to enter    Entrance Stairs-Number of Steps  4    Entrance Stairs-Rails  Can reach both    [place]  Two level    Alternate Level Stairs-Number of Steps  13      Prior Function   Level of Independence  Independent    Vocation  Retired    Leisure  being with friends, Engineer, drilling, playing peaknuckle      Cognition   Overall Cognitive Status  Within Functional Limits for tasks assessed      Observation/Other Assessments   Focus on Therapeutic Outcomes (FOTO)   62% limited   predicted 42% limited     Posture/Postural Control   Posture/Postural Control  Postural limitations    Postural Limitations  Rounded Shoulders;Forward head      ROM / Strength   AROM / PROM / Strength  Strength;AROM;PROM      AROM   Overall AROM Comments  L shoulder upper trap dominate  with singificant shoulder hiking, reported end range tightness in the axilla chest likely related to hx or breast cancer and lumpectomy and lymphnode removal.    AROM Assessment Site  Shoulder    Right/Left Shoulder  Left;Right    Right Shoulder Extension  65 Degrees    Right Shoulder Flexion  144 Degrees    Right Shoulder ABduction  104 Degrees    Right Shoulder Internal Rotation  --   T9   Right Shoulder External Rotation  --   T4   Left Shoulder Extension  40 Degrees    Left Shoulder Flexion  124 Degrees    Left Shoulder ABduction  75 Degrees    Left Shoulder Internal Rotation  --   T11   Left Shoulder External Rotation  --   C7     Strength   Right/Left Shoulder  Right;Left    Right Shoulder Flexion  4-/5    Right Shoulder Extension  4+/5    Right Shoulder ABduction  4/5    Right Shoulder Internal Rotation  4/5    Right Shoulder External Rotation  4/5    Left Shoulder Flexion  3+/5   reproduced concordant pain    Left Shoulder Extension  4/5    Left Shoulder ABduction  4-/5   in available ROM   Left Shoulder Internal Rotation  4-/5    Left Shoulder External Rotation  3+/5   reproduced concordant pain    Right Hand Grip (lbs)  51.6   46,55,55   Left Hand Grip (lbs)  41.3   41,43,40     Palpation   Palpation comment  TTP along the infraspinatus / Teres minor, and at the greater tubercle. tightness with multiple trigger points along the upper trap/ levator scapulae and rhomboids      Special Tests    Special Tests  Rotator Cuff Impingement    Rotator Cuff Impingment tests  Michel Bickers test;Full Can test;Empty Can test;other      Hawkins-Kennedy test   Findings  Positive    Side  Left  Empty Can test   Findings  Positive    Side  Left      Full Can test   Findings  Positive    Side  Left      other   Findings  Negative    Side  Left    Comments  external rotation lag sign in nuetral and 45 degree of abd                Objective  measurements completed on examination: See above findings.      [facility] Adult PT Treatment/Exercise - 05/30/19 0934      Exercises   Exercises  Shoulder      Shoulder Exercises: Seated   Row  Strengthening;Both;10 reps;Theraband    Theraband Level (Shoulder Row)  Level 2 (Red)    External Rotation  10 reps;Strengthening;Left;Theraband    Theraband Level (Shoulder External Rotation)  Level 2 (Red)    Internal Rotation  Strengthening;Left;10 reps;Theraband    Theraband Level (Shoulder Internal Rotation)  Level 2 (Red)      Shoulder Exercises: Stretch   Other Shoulder Stretches  upper trap stretch 2 x 30 sec    L only     Manual Therapy   Manual therapy comments  MTPR along upper trap x 2, infraspinatus x 2             PT Education - 05/30/19 0943    Education Details  evaluation findings, POC, goals, HEP with proper form/ rationale    Person(s) Educated  Patient    Methods  Explanation;Verbal cues;Handout    Comprehension  Verbalized understanding;Verbal cues required       PT Short Term Goals - 05/30/19 1029      PT SHORT TERM GOAL #1   Title  She will be independent with initial HEP    Time  3    Period  Weeks    Status  New    Target Date  06/20/19      PT SHORT TERM GOAL #2   Title  pt to verbalize and demo proper posture with liftng and carrying mechanics to reduce and prevent L shoulder pain    Time  3    Period  Weeks    Status  New    Target Date  06/20/19      PT SHORT TERM GOAL #3   Title  increase L grip strength by >/= 8# to demo improving L shoulder function    Time  3    Period  Weeks    Status  New    Target Date  06/20/19        PT Long Term Goals - 05/30/19 1032      PT LONG TERM GOAL #1   Title  Increase L shoulder abduction and ER/IR to [facility] compared bil with no report of pain for functional ROM    Time  6    Period  Weeks    Status  New    Target Date  07/11/19      PT LONG TERM GOAL #2   Title  increase L shoulder strength to  >/= 4/5 in all planes to promtoe shoulder stability with </= 2/10 pain noted during assessment    Time  6    Period  Weeks    Status  New    Target Date  07/11/19      PT LONG TERM GOAL #3   Title  She will be able to  lift 3 items of clothes from closet with 1-2 max pain    Time  6    Period  Weeks    Status  New    Target Date  07/11/19      PT LONG TERM GOAL #4   Title  increase FOTO score to 42% limitation to demo improvement in function    Time  6    Period  Weeks    Status  New    Target Date  07/11/19      PT LONG TERM GOAL #5   Title  pt to be I with all HEP given as of last visit to maintain and progress current level of function    Time  6    Period  Weeks    Status  New    Target Date  07/11/19             Plan - 05/30/19 1023    Clinical Impression Statement  pt presents to OPPT with CC of L shoulder pain starting 2-3 months ago with no specific onset. She has hx of L shoulder pain which she underwent PT and had good results. she has limited shoulder AROM and associated weakness secondary to pain. TTP along infraspinatus, teres minor and at the greater trochanter. special testing suggest likelihood of rotator cuff involvement. She would benefit from phyiscal therapy to increase L shoulder ROM, strength, demonstrate efficient posture and lifting mechanics and maximize her overall function by addressing the deficits listed.    Personal Factors and Comorbidities  Age;Comorbidity 2    Comorbidities  hx of cx, surgical hx or L lumpectomy/ lympnode removal    Examination-Activity Limitations  Lift    Stability/Clinical Decision Making  Evolving/Moderate complexity    Clinical Decision Making  Moderate    Rehab Potential  Good    PT Frequency  2x / week    PT Duration  6 weeks    PT Treatment/Interventions  ADLs/Self Care Home Management;Cryotherapy;Therapeutic activities;Therapeutic exercise;Manual techniques;Dry needling;Taping;Patient/family education    PT Next  Visit Plan  (Hx of cx no heat or E-stim) review / update HEP, STW for L upper trap/ levator scapulae/ infraspin and teresminor, shoulder AAROM, shoulder strengtheing,    PT Home Exercise Plan  shoulder internal/external rotation, rows, upper trap stretch    Consulted and Agree with Plan of Care  Patient       Patient will benefit from skilled therapeutic intervention in order to improve the following deficits and impairments:  Pain, Improper body mechanics, Postural dysfunction, Impaired UE functional use, Decreased activity tolerance, Decreased strength, Decreased endurance, Decreased range of motion, Increased fascial restricitons  Visit Diagnosis: 1. Chronic left shoulder pain   2. Stiffness of left shoulder, not elsewhere classified   3. Muscle weakness (generalized)   4. Abnormal posture        Problem List Patient Active Problem List   Diagnosis Date Noted  . Chronic right shoulder pain 11/27/2017  . Unilateral primary osteoarthritis, left knee 11/27/2017    [name] PT, DPT, LAT, ATC  05/30/19  10:39 AM      [facility] [address] [place], [place], [address] Phone: [phone]   Fax:  [phone]  Name: Jillian Buchanan MRN: [id number] Date of Birth: 1933-10-11

## 2019-06-04 ENCOUNTER — Ambulatory Visit (INDEPENDENT_AMBULATORY_CARE_PROVIDER_SITE_OTHER): Payer: Medicare Other | Admitting: Orthopaedic Surgery

## 2019-06-04 ENCOUNTER — Other Ambulatory Visit: Payer: Self-pay

## 2019-06-04 ENCOUNTER — Encounter: Payer: Self-pay | Admitting: Orthopaedic Surgery

## 2019-06-04 VITALS — BP 139/82 | HR 93 | Ht 65.0 in | Wt 235.0 lb

## 2019-06-04 DIAGNOSIS — M1712 Unilateral primary osteoarthritis, left knee: Secondary | ICD-10-CM

## 2019-06-04 MED ORDER — LIDOCAINE HCL 1 % IJ SOLN
2.0000 mL | INTRAMUSCULAR | Status: AC | PRN
  Administered 2019-06-04: 2 mL

## 2019-06-04 MED ORDER — METHYLPREDNISOLONE ACETATE 40 MG/ML IJ SUSP
80.0000 mg | INTRAMUSCULAR | Status: AC | PRN
  Administered 2019-06-04: 80 mg via INTRA_ARTICULAR

## 2019-06-04 MED ORDER — BUPIVACAINE HCL 0.5 % IJ SOLN
2.0000 mL | INTRAMUSCULAR | Status: AC | PRN
  Administered 2019-06-04: 14:00:00 2 mL via INTRA_ARTICULAR

## 2019-06-04 NOTE — Progress Notes (Signed)
Office Visit Note   Patient: Jillian Buchanan           Date of Birth: [birth year]           MRN: [id number] Visit Date: 06/04/2019              Requested by: Seward Carol, MD [address] [place],  [place] [address] PCP: Seward Carol, MD   Assessment & Plan: Visit Diagnoses:  1. Unilateral primary osteoarthritis, left knee     Plan: Osteoarthritis both knees.  2 weeks ago had cortisone injection right knee with good relief and wants a cortisone injection in her left knee today.  Has been going to physical therapy for her left shoulder.  Long discussion regarding follow-up over time and consideration of MRI scan if shoulder does not improve with over-the-counter medicines and exercises  Follow-Up Instructions: Return if symptoms worsen or fail to improve.   Orders:  Orders Placed This Encounter  Procedures  . Large Joint Inj: L knee   No orders of the defined types were placed in this encounter.     Procedures: Large Joint Inj: L knee on 06/04/2019 1:39 PM Indications: pain and diagnostic evaluation Details: 25 G 1.5 in needle, anteromedial approach  Arthrogram: No  Medications: 2 mL lidocaine 1 %; 2 mL bupivacaine 0.5 %; 80 mg methylPREDNISolone acetate 40 MG/ML Procedure, treatment alternatives, risks and benefits explained, specific risks discussed. Consent was given by the patient. Patient was prepped and draped in the usual sterile fashion.       Clinical Data: No additional findings.   Subjective: Chief Complaint  Patient presents with  . Left Knee - Follow-up  Patient presents today for her left knee. She was here two weeks ago and wants to get a cortisone injection in her left knee today. She has been having swelling and pain in her left knee. She received a cortisone injection in her right knee at her last visit and states that it helped. She does not take anything for pain.   HPI  Review of Systems  Constitutional: Positive for fatigue.   HENT: Negative for ear pain.   Eyes: Negative for pain.  Respiratory: Positive for shortness of breath.   Cardiovascular: Negative for leg swelling.  Gastrointestinal: Negative for constipation and diarrhea.  Endocrine: Negative for cold intolerance and heat intolerance.  Genitourinary: Negative for difficulty urinating.  Musculoskeletal: Positive for joint swelling.  Skin: Positive for rash.  Allergic/Immunologic: Negative for food allergies.  Neurological: Positive for weakness.  Hematological: Does not bruise/bleed easily.  Psychiatric/Behavioral: Positive for sleep disturbance.     Objective: Vital Signs: BP 139/82   Pulse 93   Ht 5\' 5"  (1.651 m)   Wt 235 lb (106.6 kg)   BMI 39.11 kg/m   Physical Exam Constitutional:      Appearance: She is well-developed.  Eyes:     Pupils: Pupils are equal, round, and reactive to light.  Pulmonary:     Effort: Pulmonary effort is normal.  Skin:    General: Skin is warm and dry.  Neurological:     Mental Status: She is alert and oriented to person, place, and time.  Psychiatric:        Behavior: Behavior normal.     Ortho Exam awake alert and oriented x3.  Comfortable sitting.  Predominately medial joint pain left knee.  Full extension and flexion over 110 degrees.  No instability.  Some patellar crepitation but no pain with patellar  compression.  No calf pain. Specialty Comments:  No specialty comments available.  Imaging: No results found.   PMFS History: Patient Active Problem List   Diagnosis Date Noted  . Chronic right shoulder pain 11/27/2017  . Unilateral primary osteoarthritis, left knee 11/27/2017   Past Medical History:  Diagnosis Date  . Anxiety    self reported  . Breast cancer ([place])   . Cataract   . Hypertension     History reviewed. No pertinent family history.  Past Surgical History:  Procedure Laterality Date  . ABDOMINAL HYSTERECTOMY    . BREAST LUMPECTOMY  1988  . INTRAOCULAR PROSTHESES  INSERTION  8/248/2005   Social History   Occupational History  . Not on file  Tobacco Use  . Smoking status: Never Smoker  . Smokeless tobacco: Never Used  Substance and Sexual Activity  . Alcohol use: No  . Drug use: No  . Sexual activity: Never

## 2019-06-06 ENCOUNTER — Other Ambulatory Visit: Payer: Self-pay

## 2019-06-06 ENCOUNTER — Ambulatory Visit: Payer: Medicare Other | Attending: Orthopedic Surgery | Admitting: Physical Therapy

## 2019-06-06 ENCOUNTER — Encounter: Payer: Self-pay | Admitting: Physical Therapy

## 2019-06-06 DIAGNOSIS — R293 Abnormal posture: Secondary | ICD-10-CM | POA: Diagnosis present

## 2019-06-06 DIAGNOSIS — M25512 Pain in left shoulder: Secondary | ICD-10-CM | POA: Diagnosis present

## 2019-06-06 DIAGNOSIS — G8929 Other chronic pain: Secondary | ICD-10-CM | POA: Insufficient documentation

## 2019-06-06 DIAGNOSIS — M25612 Stiffness of left shoulder, not elsewhere classified: Secondary | ICD-10-CM

## 2019-06-06 DIAGNOSIS — M6281 Muscle weakness (generalized): Secondary | ICD-10-CM | POA: Insufficient documentation

## 2019-06-06 NOTE — Therapy (Signed)
[place], [place], [address] Phone: [phone]   Fax:  [phone]  Physical Therapy Treatment  Patient Details  Name: Jillian Buchanan MRN: [id number] Date of Birth: 1932/12/04 Referring Provider (PT): Jillian Buchanan, [place]   Encounter Date: 06/06/2019  PT End of Session - 06/06/19 0854    Visit Number  2    Number of Visits  13    Date for PT Re-Evaluation  07/11/19    Authorization Type  MCR: KX mod by 15th visit, progress note at 10th visit    PT Start Time  0854   pt arrived 9 min late today   PT Stop Time  0928    PT Time Calculation (min)  34 min    Activity Tolerance  Patient tolerated treatment well    Behavior During Therapy  [facility] for tasks assessed/performed       Past Medical History:  Diagnosis Date  . Anxiety    self reported  . Breast cancer ([place])   . Cataract   . Hypertension     Past Surgical History:  Procedure Laterality Date  . ABDOMINAL HYSTERECTOMY    . BREAST LUMPECTOMY  1988  . INTRAOCULAR PROSTHESES INSERTION  8/248/2005    There were no vitals filed for this visit.  Subjective Assessment - 06/06/19 0855    Subjective  "The shoulder is doing better today, I have been doing my exercises. Since my last session I did have any pain until a few days after"    Currently in Pain?  No/denies    Aggravating Factors   somee movements                       [facility] Adult PT Treatment/Exercise - 06/06/19 0857      Shoulder Exercises: Standing   Protraction  Both   wall push up 2 x 10   Protraction Limitations  demonstration for proper form    External Rotation  Strengthening;Left;10 reps;Theraband   x 2 sets   Theraband Level (Shoulder External Rotation)  Level 2 (Red)    Internal Rotation  Strengthening;Left;10 reps;Theraband   x 2 sets   Theraband Level (Shoulder Internal Rotation)  Level 2 (Red)    Flexion  Strengthening;Both;10 reps   scaption angle    Row  Strengthening;10 reps;Theraband   x 2 sets   Theraband Level (Shoulder Row)  Level 2 (Red)      Shoulder Exercises: ROM/Strengthening   UBE (Upper Arm Bike)  L1 x 4 min    changing direction at 2 min     Shoulder Exercises: Stretch   Other Shoulder Stretches  levator scapulae stretch on the L 2 x 30 sec    Other Shoulder Stretches  upper trap stretch 2 x 30 sec       Manual Therapy   Manual Therapy  Soft tissue mobilization    Manual therapy comments  MTPR along upper trap x 2, infraspinatus x 2    Soft tissue mobilization  IASTM along the upper trap/ levator scapulae               PT Short Term Goals - 05/30/19 1029      PT SHORT TERM GOAL #1   Title  She will be independent with initial HEP    Time  3    Period  Weeks    Status  New    Target Date  06/20/19  PT SHORT TERM GOAL #2   Title  pt to verbalize and demo proper posture with liftng and carrying mechanics to reduce and prevent L shoulder pain    Time  3    Period  Weeks    Status  New    Target Date  06/20/19      PT SHORT TERM GOAL #3   Title  increase L grip strength by >/= 8# to demo improving L shoulder function    Time  3    Period  Weeks    Status  New    Target Date  06/20/19        PT Long Term Goals - 05/30/19 1032      PT LONG TERM GOAL #1   Title  Increase L shoulder abduction and ER/IR to [facility] compared bil with no report of pain for functional ROM    Time  6    Period  Weeks    Status  New    Target Date  07/11/19      PT LONG TERM GOAL #2   Title  increase L shoulder strength to >/= 4/5 in all planes to promtoe shoulder stability with </= 2/10 pain noted during assessment    Time  6    Period  Weeks    Status  New    Target Date  07/11/19      PT LONG TERM GOAL #3   Title  She will be able to lift 3 items of clothes from closet with 1-2 max pain    Time  6    Period  Weeks    Status  New    Target Date  07/11/19      PT LONG TERM GOAL #4   Title  increase  FOTO score to 42% limitation to demo improvement in function    Time  6    Period  Weeks    Status  New    Target Date  07/11/19      PT LONG TERM GOAL #5   Title  pt to be I with all HEP given as of last visit to maintain and progress current level of function    Time  6    Period  Weeks    Status  New    Target Date  07/11/19            Plan - 06/06/19 5409    Clinical Impression Statement  [name] arrived 9 min late today but did report no pain today and that she has been consistent with her HEP. Reviewed HEP which she required min intermittent cues for proper form. focused on scapular stability and she performed exercises well noting fatigue but had no pain.    PT Treatment/Interventions  ADLs/Self Care Home Management;Cryotherapy;Therapeutic activities;Therapeutic exercise;Manual techniques;Dry needling;Taping;Patient/family education    PT Next Visit Plan  (Hx of cx no heat or E-stim)  update HEP, STW for L upper trap/ levator scapulae/ infraspin and teresminor, shoulder AAROM, shoulder strengtheing,    PT Home Exercise Plan  shoulder internal/external rotation, rows, upper trap stretch    Consulted and Agree with Plan of Care  Patient       Patient will benefit from skilled therapeutic intervention in order to improve the following deficits and impairments:  Pain, Improper body mechanics, Postural dysfunction, Impaired UE functional use, Decreased activity tolerance, Decreased strength, Decreased endurance, Decreased range of motion, Increased fascial restricitons  Visit Diagnosis: 1. Stiffness of left shoulder,  not elsewhere classified   2. Chronic left shoulder pain   3. Muscle weakness (generalized)   4. Abnormal posture        Problem List Patient Active Problem List   Diagnosis Date Noted  . Chronic right shoulder pain 11/27/2017  . Unilateral primary osteoarthritis, left knee 11/27/2017   Jillian Buchanan PT, DPT, LAT, ATC  06/06/19  9:29  AM      [facility] [address] [place], [place], [address] Phone: [phone]   Fax:  [phone]  Name: Jillian Buchanan MRN: [id number] Date of Birth: 1933/03/26

## 2019-06-11 ENCOUNTER — Other Ambulatory Visit: Payer: Self-pay | Admitting: Internal Medicine

## 2019-06-11 DIAGNOSIS — I739 Peripheral vascular disease, unspecified: Secondary | ICD-10-CM

## 2019-06-12 ENCOUNTER — Ambulatory Visit: Payer: Medicare Other | Admitting: Physical Therapy

## 2019-06-17 ENCOUNTER — Ambulatory Visit: Payer: Medicare Other

## 2019-06-17 ENCOUNTER — Other Ambulatory Visit: Payer: Self-pay

## 2019-06-17 DIAGNOSIS — M6281 Muscle weakness (generalized): Secondary | ICD-10-CM

## 2019-06-17 DIAGNOSIS — R293 Abnormal posture: Secondary | ICD-10-CM

## 2019-06-17 DIAGNOSIS — G8929 Other chronic pain: Secondary | ICD-10-CM

## 2019-06-17 DIAGNOSIS — M25612 Stiffness of left shoulder, not elsewhere classified: Secondary | ICD-10-CM | POA: Diagnosis not present

## 2019-06-17 NOTE — Therapy (Signed)
[place], [place], [address] Phone: [phone]   Fax:  [phone]  Physical Therapy Treatment  Patient Details  Name: Jillian Buchanan MRN: [id number] Date of Birth: 06-29-1933 Referring Provider (PT): Cherylann Ratel, [place]   Encounter Date: 06/17/2019  PT End of Session - 06/17/19 0923    Visit Number  3    Number of Visits  13    Date for PT Re-Evaluation  07/11/19    Authorization Type  MCR: KX mod by 15th visit, progress note at 10th visit    PT Start Time  0923   late 7 min   PT Stop Time  1000    PT Time Calculation (min)  37 min    Activity Tolerance  Patient tolerated treatment well    Behavior During Therapy  [facility] for tasks assessed/performed       Past Medical History:  Diagnosis Date  . Anxiety    self reported  . Breast cancer ([place])   . Cataract   . Hypertension     Past Surgical History:  Procedure Laterality Date  . ABDOMINAL HYSTERECTOMY    . BREAST LUMPECTOMY  1988  . INTRAOCULAR PROSTHESES INSERTION  8/248/2005    There were no vitals filed for this visit.  Subjective Assessment - 06/17/19 0925    Subjective  No pain . Today good.  Last pain  was yesterday lifted a flower pot.  She has been oin PT before for shoulder pain.    Currently in Pain?  No/denies                       [facility] Adult PT Treatment/Exercise - 06/17/19 0001      Shoulder Exercises: ROM/Strengthening   UBE (Upper Arm Bike)  L1.5  3 min forward 2 back.       neck stretching taps bilaterally.    There ex:   Band exercise  Red row/and shoulder rotation.   There Activity: lifting mechanics from floor to waist and waist  With carry. Max safe weight 20# and probably less to decr pain in back and shoulder with lifting    PT Short Term Goals - 06/17/19 0930      PT SHORT TERM GOAL #1   Title  She will be independent with initial HEP    Status  Achieved      PT SHORT TERM GOAL #2    Title  pt to verbalize and demo proper posture with liftng and carrying mechanics to reduce and prevent L shoulder pain    Baseline  She is not able to lift with good mechanics due to back and knee pain but we discussed changing environment to limit bending  and to keep loads close and  not lift more than 20 # and less if painvul.    Status  Achieved      PT SHORT TERM GOAL #3   Title  increase L grip strength by >/= 8# to demo improving L shoulder function    Baseline  60,55,55    Status  Achieved        PT Long Term Goals - 05/30/19 1032      PT LONG TERM GOAL #1   Title  Increase L shoulder abduction and ER/IR to [facility] compared bil with no report of pain for functional ROM    Time  6    Period  Weeks    Status  New  Target Date  07/11/19      PT LONG TERM GOAL #2   Title  increase L shoulder strength to >/= 4/5 in all planes to promtoe shoulder stability with </= 2/10 pain noted during assessment    Time  6    Period  Weeks    Status  New    Target Date  07/11/19      PT LONG TERM GOAL #3   Title  She will be able to lift 3 items of clothes from closet with 1-2 max pain    Time  6    Period  Weeks    Status  New    Target Date  07/11/19      PT LONG TERM GOAL #4   Title  increase FOTO score to 42% limitation to demo improvement in function    Time  6    Period  Weeks    Status  New    Target Date  07/11/19      PT LONG TERM GOAL #5   Title  pt to be I with all HEP given as of last visit to maintain and progress current level of function    Time  6    Period  Weeks    Status  New    Target Date  07/11/19            Plan - 06/17/19 1001    Clinical Impression Statement  she wa late again . She was able to demo HEp correctly and  after lifitng assessment she  was educated in Office manager and  modifications to decrease strain of lifitng.    PT Treatment/Interventions  ADLs/Self Care Home Management;Cryotherapy;Therapeutic activities;Therapeutic exercise;Manual  techniques;Dry needling;Taping;Patient/family education    PT Next Visit Plan  (Hx of cx no heat or E-stim)  update HEP, STW for L upper trap/ levator scapulae/ infraspin and teresminor, shoulder AAROM, shoulder strengtheing,    PT Home Exercise Plan  shoulder internal/external rotation, rows, upper trap stretch    Consulted and Agree with Plan of Care  Patient       Patient will benefit from skilled therapeutic intervention in order to improve the following deficits and impairments:  Pain, Improper body mechanics, Postural dysfunction, Impaired UE functional use, Decreased activity tolerance, Decreased strength, Decreased endurance, Decreased range of motion, Increased fascial restricitons  Visit Diagnosis: 1. Stiffness of left shoulder, not elsewhere classified   2. Chronic left shoulder pain   3. Muscle weakness (generalized)   4. Abnormal posture        Problem List Patient Active Problem List   Diagnosis Date Noted  . Chronic right shoulder pain 11/27/2017  . Unilateral primary osteoarthritis, left knee 11/27/2017    [name] PT 06/17/2019, 10:02 AM  [facility] [address] [place], [place], [address] Phone: [phone]   Fax:  [phone]  Name: Jillian Buchanan MRN: [id number] Date of Birth: [birth year]

## 2019-06-19 ENCOUNTER — Telehealth: Payer: Self-pay | Admitting: Orthopaedic Surgery

## 2019-06-19 ENCOUNTER — Ambulatory Visit
Admission: RE | Admit: 2019-06-19 | Discharge: 2019-06-19 | Disposition: A | Discharge status: Home / Self Care | Payer: Medicare Other | Source: Ambulatory Visit | Attending: Internal Medicine | Admitting: Internal Medicine

## 2019-06-19 ENCOUNTER — Encounter: Payer: Self-pay | Admitting: Orthopaedic Surgery

## 2019-06-19 DIAGNOSIS — I739 Peripheral vascular disease, unspecified: Secondary | ICD-10-CM

## 2019-06-19 NOTE — Telephone Encounter (Signed)
[facility] for letter-call pt to get specifics of what needs to be included

## 2019-06-19 NOTE — Telephone Encounter (Signed)
Patient left a voicemail asking if Dr. Durward Fortes could write her a "prescription based letter stating that she should exercise for good physical health or something to that effect."  Please advise.

## 2019-06-19 NOTE — Telephone Encounter (Signed)
Please advise 

## 2019-06-19 NOTE — Telephone Encounter (Signed)
Spoke with patient. Letter is up front for patient to pick up at her convenience.

## 2019-06-20 ENCOUNTER — Other Ambulatory Visit: Payer: Self-pay

## 2019-06-20 ENCOUNTER — Encounter: Payer: Self-pay | Admitting: Physical Therapy

## 2019-06-20 ENCOUNTER — Ambulatory Visit: Payer: Medicare Other | Admitting: Physical Therapy

## 2019-06-20 DIAGNOSIS — M25612 Stiffness of left shoulder, not elsewhere classified: Secondary | ICD-10-CM

## 2019-06-20 DIAGNOSIS — G8929 Other chronic pain: Secondary | ICD-10-CM

## 2019-06-20 DIAGNOSIS — M25512 Pain in left shoulder: Secondary | ICD-10-CM

## 2019-06-20 DIAGNOSIS — M6281 Muscle weakness (generalized): Secondary | ICD-10-CM

## 2019-06-20 NOTE — Therapy (Signed)
[place], [place], [address] Phone: [phone]   Fax:  [phone]  Physical Therapy Treatment  Patient Details  Name: Jillian Buchanan MRN: [id number] Date of Birth: 1933-10-30 Referring Provider (PT): Cherylann Ratel, [place]   Encounter Date: 06/20/2019  PT End of Session - 06/20/19 0926    Visit Number  4    Number of Visits  13    Date for PT Re-Evaluation  07/11/19    Authorization Type  MCR: KX mod by 15th visit, progress note at 10th visit    PT Start Time  0928    PT Stop Time  1008    PT Time Calculation (min)  40 min    Activity Tolerance  Patient tolerated treatment well    Behavior During Therapy  [facility] for tasks assessed/performed       Past Medical History:  Diagnosis Date  . Anxiety    self reported  . Breast cancer ([place])   . Cataract   . Hypertension     Past Surgical History:  Procedure Laterality Date  . ABDOMINAL HYSTERECTOMY    . BREAST LUMPECTOMY  1988  . INTRAOCULAR PROSTHESES INSERTION  8/248/2005    There were no vitals filed for this visit.  Subjective Assessment - 06/20/19 0932    Subjective  "some soreness when backing out of the driving using the L hand to turn the wheel"    Patient Stated Goals  strengthening the arm, increase grip, decrease pain    Currently in Pain?  No/denies    Pain Score  0-No pain         [facility] PT Assessment - 06/20/19 0001      Assessment   Medical Diagnosis  Chronic left shoulder pain     Referring Provider (PT)  B[place]win [place] Lonzo Cloud                   [facility] Adult PT Treatment/Exercise - 06/20/19 0001      Elbow Exercises   Elbow Flexion  Seated;Left;Strengthening;Bar weights/barbell;10 reps   4# x 2 sets     Shoulder Exercises: Supine   Protraction  Strengthening;15 reps;Both   using dowel rod     Shoulder Exercises: Seated   Extension  Strengthening;Both;10 reps;Theraband   x 2 sets   Theraband Level  (Shoulder Extension)  Level 3 (Green)    Row  Strengthening;15 reps;Theraband    Theraband Level (Shoulder Row)  Level 3 (Green)    External Rotation  Strengthening;Left;Theraband;15 reps   x 2 sets   Theraband Level (Shoulder External Rotation)  Level 2 (Red)    Internal Rotation  15 reps;Theraband;Other (comment)   x 2 sets   Theraband Level (Shoulder Internal Rotation)  Level 2 (Red)    Other Seated Exercises  pilates cirlce pushing in/ out while rotation back and forth at shoulder height 2 x 10      Shoulder Exercises: ROM/Strengthening   UBE (Upper Arm Bike)  L2 x 4 min    changing direction at 2 min              PT Short Term Goals - 06/17/19 0930      PT SHORT TERM GOAL #1   Title  She will be independent with initial HEP    Status  Achieved      PT SHORT TERM GOAL #2   Title  pt to verbalize and demo proper posture with liftng and carrying  mechanics to reduce and prevent L shoulder pain    Baseline  She is not able to lift with good mechanics due to back and knee pain but we discussed changing environment to limit bending  and to keep loads close and  not lift more than 20 # and less if painvul.    Status  Achieved      PT SHORT TERM GOAL #3   Title  increase L grip strength by >/= 8# to demo improving L shoulder function    Baseline  60,55,55    Status  Achieved        PT Long Term Goals - 05/30/19 1032      PT LONG TERM GOAL #1   Title  Increase L shoulder abduction and ER/IR to [facility] compared bil with no report of pain for functional ROM    Time  6    Period  Weeks    Status  New    Target Date  07/11/19      PT LONG TERM GOAL #2   Title  increase L shoulder strength to >/= 4/5 in all planes to promtoe shoulder stability with </= 2/10 pain noted during assessment    Time  6    Period  Weeks    Status  New    Target Date  07/11/19      PT LONG TERM GOAL #3   Title  She will be able to lift 3 items of clothes from closet with 1-2 max pain    Time   6    Period  Weeks    Status  New    Target Date  07/11/19      PT LONG TERM GOAL #4   Title  increase FOTO score to 42% limitation to demo improvement in function    Time  6    Period  Weeks    Status  New    Target Date  07/11/19      PT LONG TERM GOAL #5   Title  pt to be I with all HEP given as of last visit to maintain and progress current level of function    Time  6    Period  Weeks    Status  New    Target Date  07/11/19            Plan - 06/20/19 1008    Clinical Impression Statement  Focused session on shoulder strengthening and scapular stability. She did not some fatigue with all activities but reported no pain during or following session. Discussed with pt over the course of the next 3-4 visits if she continues to do well then plan to discuss potential discharge.    PT Treatment/Interventions  ADLs/Self Care Home Management;Cryotherapy;Therapeutic activities;Therapeutic exercise;Manual techniques;Dry needling;Taping;Patient/family education    PT Next Visit Plan  (Hx of cx no heat or E-stim)  update HEP, STW for L upper trap/ levator scapulae/ infraspin and teresminor, shoulder AAROM, shoulder strengtheing,    Consulted and Agree with Plan of Care  Patient       Patient will benefit from skilled therapeutic intervention in order to improve the following deficits and impairments:  Pain, Improper body mechanics, Postural dysfunction, Impaired UE functional use, Decreased activity tolerance, Decreased strength, Decreased endurance, Decreased range of motion, Increased fascial restricitons  Visit Diagnosis: Stiffness of left shoulder, not elsewhere classified  Chronic left shoulder pain  Muscle weakness (generalized)     Problem List Patient Active Problem List  Diagnosis Date Noted  . Chronic right shoulder pain 11/27/2017  . Unilateral primary osteoarthritis, left knee 11/27/2017    [name] PT, DPT, LAT, ATC  06/20/19  10:10  AM      [facility] [address] [place], [place], [address] Phone: [phone]   Fax:  [phone]  Name: Jillian Buchanan MRN: [id number] Date of Birth: Jul 01, 1933

## 2019-06-25 ENCOUNTER — Ambulatory Visit: Payer: Medicare Other | Admitting: Physical Therapy

## 2019-06-25 ENCOUNTER — Other Ambulatory Visit: Payer: Self-pay

## 2019-06-25 ENCOUNTER — Encounter: Payer: Self-pay | Admitting: Physical Therapy

## 2019-06-25 VITALS — BP 122/73 | HR 85

## 2019-06-25 DIAGNOSIS — M25512 Pain in left shoulder: Secondary | ICD-10-CM

## 2019-06-25 DIAGNOSIS — G8929 Other chronic pain: Secondary | ICD-10-CM

## 2019-06-25 DIAGNOSIS — R293 Abnormal posture: Secondary | ICD-10-CM

## 2019-06-25 DIAGNOSIS — M25612 Stiffness of left shoulder, not elsewhere classified: Secondary | ICD-10-CM

## 2019-06-25 DIAGNOSIS — M6281 Muscle weakness (generalized): Secondary | ICD-10-CM

## 2019-06-25 NOTE — Therapy (Signed)
[place], [place], [address] Phone: [phone]   Fax:  [phone]  Physical Therapy Treatment  Patient Details  Name: Jillian Buchanan MRN: [id number] Date of Birth: 08-22-1933 Referring Provider (PT): Cherylann Ratel, [place]   Encounter Date: 06/25/2019  PT End of Session - 06/25/19 1148    Visit Number  5    Number of Visits  13    Date for PT Re-Evaluation  07/11/19    Authorization Type  MCR: KX mod by 15th visit, progress note at 10th visit    PT Start Time  1148    PT Stop Time  1228    PT Time Calculation (min)  40 min       Past Medical History:  Diagnosis Date  . Anxiety    self reported  . Breast cancer ([place])   . Cataract   . Hypertension     Past Surgical History:  Procedure Laterality Date  . ABDOMINAL HYSTERECTOMY    . BREAST LUMPECTOMY  1988  . INTRAOCULAR PROSTHESES INSERTION  8/248/2005    Vitals:   06/25/19 1148  BP: 122/73  Pulse: 85    Subjective Assessment - 06/25/19 1148    Subjective  "I am feeling alittle off today, The shoulder is doing good I was able to back out of the driving with no issues"    Patient Stated Goals  strengthening the arm, increase grip, decrease pain    Pain Score  0-No pain                       [facility] Adult PT Treatment/Exercise - 06/25/19 0001      Shoulder Exercises: Seated   Horizontal ABduction  Strengthening;10 reps;Theraband   with red theraband   Other Seated Exercises  scapular retraction with ER 2 x 10 with red theraband      Shoulder Exercises: Sidelying   ABduction  Strengthening;Left;12 reps   in r sidelying     Shoulder Exercises: ROM/Strengthening   UBE (Upper Arm Bike)  L4 x 5 min    changing direction at 2:30     Shoulder Exercises: Stretch   Other Shoulder Stretches  posterior capsule stretch 2 x 30 sec      Manual Therapy   Manual Therapy  Passive ROM;Scapular mobilization    Scapular  Mobilization  upward rotation with active sidelying abduction     Passive ROM  gentle distraction osscillation throughout with flexion / abduction working into end ranges               PT Short Term Goals - 06/17/19 0930      PT SHORT TERM GOAL #1   Title  She will be independent with initial HEP    Status  Achieved      PT SHORT TERM GOAL #2   Title  pt to verbalize and demo proper posture with liftng and carrying mechanics to reduce and prevent L shoulder pain    Baseline  She is not able to lift with good mechanics due to back and knee pain but we discussed changing environment to limit bending  and to keep loads close and  not lift more than 20 # and less if painvul.    Status  Achieved      PT SHORT TERM GOAL #3   Title  increase L grip strength by >/= 8# to demo improving L shoulder function    Baseline  [id number]    Status  Achieved        PT Long Term Goals - 05/30/19 1032      PT LONG TERM GOAL #1   Title  Increase L shoulder abduction and ER/IR to [facility] compared bil with no report of pain for functional ROM    Time  6    Period  Weeks    Status  New    Target Date  07/11/19      PT LONG TERM GOAL #2   Title  increase L shoulder strength to >/= 4/5 in all planes to promtoe shoulder stability with </= 2/10 pain noted during assessment    Time  6    Period  Weeks    Status  New    Target Date  07/11/19      PT LONG TERM GOAL #3   Title  She will be able to lift 3 items of clothes from closet with 1-2 max pain    Time  6    Period  Weeks    Status  New    Target Date  07/11/19      PT LONG TERM GOAL #4   Title  increase FOTO score to 42% limitation to demo improvement in function    Time  6    Period  Weeks    Status  New    Target Date  07/11/19      PT LONG TERM GOAL #5   Title  pt to be I with all HEP given as of last visit to maintain and progress current level of function    Time  6    Period  Weeks    Status  New    Target Date  07/11/19             Plan - 06/25/19 1220    Clinical Impression Statement  pt noted no pain coming in. continued working on scapulohumeral rhythm with mobs and shoulder strengthening. pt noted no pain during but does fatigue quickly with strengthening but doesn't report any pain.    PT Treatment/Interventions  ADLs/Self Care Home Management;Cryotherapy;Therapeutic activities;Therapeutic exercise;Manual techniques;Dry needling;Taping;Patient/family education    PT Next Visit Plan  (Hx of cx no heat or E-stim)  update HEP, STW for L upper trap/ levator scapulae/ infraspin and teresminor, shoulder AAROM, shoulder strengtheing,    PT Home Exercise Plan  shoulder internal/external rotation, rows, upper trap stretch    Consulted and Agree with Plan of Care  Patient       Patient will benefit from skilled therapeutic intervention in order to improve the following deficits and impairments:  Pain, Improper body mechanics, Postural dysfunction, Impaired UE functional use, Decreased activity tolerance, Decreased strength, Decreased endurance, Decreased range of motion, Increased fascial restricitons  Visit Diagnosis: Stiffness of left shoulder, not elsewhere classified  Chronic left shoulder pain  Muscle weakness (generalized)  Abnormal posture     Problem List Patient Active Problem List   Diagnosis Date Noted  . Chronic right shoulder pain 11/27/2017  . Unilateral primary osteoarthritis, left knee 11/27/2017   [name] PT, DPT, LAT, ATC  06/25/19  12:29 PM      [facility] [address] [place], [place], [address] Phone: [phone]   Fax:  [phone]  Name: Jillian Buchanan MRN: [id number] Date of Birth: 07-14-1933

## 2019-06-27 ENCOUNTER — Other Ambulatory Visit: Payer: Self-pay

## 2019-06-27 ENCOUNTER — Encounter: Payer: Self-pay | Admitting: Physical Therapy

## 2019-06-27 ENCOUNTER — Ambulatory Visit: Payer: Medicare Other | Admitting: Physical Therapy

## 2019-06-27 DIAGNOSIS — M6281 Muscle weakness (generalized): Secondary | ICD-10-CM

## 2019-06-27 DIAGNOSIS — G8929 Other chronic pain: Secondary | ICD-10-CM

## 2019-06-27 DIAGNOSIS — M25612 Stiffness of left shoulder, not elsewhere classified: Secondary | ICD-10-CM | POA: Diagnosis not present

## 2019-06-27 DIAGNOSIS — R293 Abnormal posture: Secondary | ICD-10-CM

## 2019-06-27 NOTE — Therapy (Signed)
[place], [place], [address] Phone: [phone]   Fax:  [phone]  Physical Therapy Treatment  Patient Details  Name: Jillian Buchanan MRN: [id number] Date of Birth: [birth year] Referring Provider (PT): Cherylann Ratel, [place]   Encounter Date: 06/27/2019  PT End of Session - 06/27/19 1016    Visit Number  6    Number of Visits  13    Date for PT Re-Evaluation  07/11/19    Authorization Type  MCR: KX mod by 15th visit, progress note at 10th visit    PT Start Time  1016    PT Stop Time  1054    PT Time Calculation (min)  38 min    Activity Tolerance  Patient tolerated treatment well    Behavior During Therapy  [facility] for tasks assessed/performed       Past Medical History:  Diagnosis Date  . Anxiety    self reported  . Breast cancer ([place])   . Cataract   . Hypertension     Past Surgical History:  Procedure Laterality Date  . ABDOMINAL HYSTERECTOMY    . BREAST LUMPECTOMY  1988  . INTRAOCULAR PROSTHESES INSERTION  8/248/2005    There were no vitals filed for this visit.  Subjective Assessment - 06/27/19 1019    Subjective  " I am doing pretty much about the same as I did last time"    Patient Stated Goals  strengthening the arm, increase grip, decrease pain                       [facility] Adult PT Treatment/Exercise - 06/27/19 0001      Elbow Exercises   Elbow Flexion  Seated;Left;Strengthening;Bar weights/barbell;15 reps   3# bil     Shoulder Exercises: Supine   Protraction  Strengthening;15 reps;Both;Weights    Protraction Weight (lbs)  3    Horizontal ABduction  Strengthening;Both;15 reps;Theraband    Theraband Level (Shoulder Horizontal ABduction)  Level 2 (Red)    Other Supine Exercises  D2 with yellow band  2 x 10    Other Supine Exercises  scapular retracton with ER 2 x 15 with red theraband      Shoulder Exercises: Sidelying   ABduction  Strengthening;Left;12 reps    x 2 sets in R sidelying   ABduction Weight (lbs)  1      Shoulder Exercises: Standing   Other Standing Exercises  lifting weight in to lower middle and upper cabinet shelf 1 x 10 each with 3#   3#, for lower and middle shelf, no weight for top shelf     Shoulder Exercises: ROM/Strengthening   UBE (Upper Arm Bike)  L2 x 62min    changing direction at 3 min     Shoulder Exercises: Stretch   Cross Chest Stretch  2 reps;30 seconds    Other Shoulder Stretches  upper trap stretch 2 x 30 sec                PT Short Term Goals - 06/17/19 0930      PT SHORT TERM GOAL #1   Title  She will be independent with initial HEP    Status  Achieved      PT SHORT TERM GOAL #2   Title  pt to verbalize and demo proper posture with liftng and carrying mechanics to reduce and prevent L shoulder pain    Baseline  She is not able to  lift with good mechanics due to back and knee pain but we discussed changing environment to limit bending  and to keep loads close and  not lift more than 20 # and less if painvul.    Status  Achieved      PT SHORT TERM GOAL #3   Title  increase L grip strength by >/= 8# to demo improving L shoulder function    Baseline  60,55,55    Status  Achieved        PT Long Term Goals - 05/30/19 1032      PT LONG TERM GOAL #1   Title  Increase L shoulder abduction and ER/IR to [facility] compared bil with no report of pain for functional ROM    Time  6    Period  Weeks    Status  New    Target Date  07/11/19      PT LONG TERM GOAL #2   Title  increase L shoulder strength to >/= 4/5 in all planes to promtoe shoulder stability with </= 2/10 pain noted during assessment    Time  6    Period  Weeks    Status  New    Target Date  07/11/19      PT LONG TERM GOAL #3   Title  She will be able to lift 3 items of clothes from closet with 1-2 max pain    Time  6    Period  Weeks    Status  New    Target Date  07/11/19      PT LONG TERM GOAL #4   Title  increase FOTO score  to 42% limitation to demo improvement in function    Time  6    Period  Weeks    Status  New    Target Date  07/11/19      PT LONG TERM GOAL #5   Title  pt to be I with all HEP given as of last visit to maintain and progress current level of function    Time  6    Period  Weeks    Status  New    Target Date  07/11/19            Plan - 06/27/19 1026    Clinical Impression Statement  no changes since the last session with no pain. continued working shoulder strengthening and stability with emphasis on endurance training. plan to see pt for 1 more visit to finalize HEP and address any remaining questions.    PT Treatment/Interventions  ADLs/Self Care Home Management;Cryotherapy;Therapeutic activities;Therapeutic exercise;Manual techniques;Dry needling;Taping;Patient/family education    PT Next Visit Plan  (Hx of cx no heat or E-stim)  update HEP, STW for L upper trap/ levator scapulae/ infraspin and teresminor, shoulder AAROM, shoulder strengtheing,    PT Home Exercise Plan  shoulder internal/external rotation, rows, upper trap stretch       Patient will benefit from skilled therapeutic intervention in order to improve the following deficits and impairments:  Pain, Improper body mechanics, Postural dysfunction, Impaired UE functional use, Decreased activity tolerance, Decreased strength, Decreased endurance, Decreased range of motion, Increased fascial restricitons  Visit Diagnosis: Stiffness of left shoulder, not elsewhere classified  Chronic left shoulder pain  Muscle weakness (generalized)  Abnormal posture     Problem List Patient Active Problem List   Diagnosis Date Noted  . Chronic right shoulder pain 11/27/2017  . Unilateral primary osteoarthritis, left knee 11/27/2017   [name]  PT, DPT, LAT, ATC  06/27/19  10:55 AM      [facility] [address] [place], [place], [address] Phone: [phone]    Fax:  [phone]  Name: Jillian Buchanan MRN: [id number] Date of Birth: [birth year]

## 2019-07-02 ENCOUNTER — Ambulatory Visit: Payer: Medicare Other | Attending: Orthopedic Surgery | Admitting: Physical Therapy

## 2019-07-02 ENCOUNTER — Other Ambulatory Visit: Payer: Self-pay

## 2019-07-02 ENCOUNTER — Encounter: Payer: Self-pay | Admitting: Physical Therapy

## 2019-07-02 DIAGNOSIS — M25512 Pain in left shoulder: Secondary | ICD-10-CM | POA: Insufficient documentation

## 2019-07-02 DIAGNOSIS — M6281 Muscle weakness (generalized): Secondary | ICD-10-CM | POA: Insufficient documentation

## 2019-07-02 DIAGNOSIS — M25612 Stiffness of left shoulder, not elsewhere classified: Secondary | ICD-10-CM | POA: Insufficient documentation

## 2019-07-02 DIAGNOSIS — G8929 Other chronic pain: Secondary | ICD-10-CM | POA: Insufficient documentation

## 2019-07-02 NOTE — Therapy (Signed)
[place], [place], [address] Phone: [phone]   Fax:  [phone]  Physical Therapy Treatment / Discharge  Patient Details  Name: Jillian Buchanan MRN: [id number] Date of Birth: 1933-08-19 Referring Provider (PT): Cherylann Ratel, [place]   Encounter Date: 07/02/2019  PT End of Session - 07/02/19 1022    Visit Number  7    Number of Visits  13    Date for PT Re-Evaluation  07/11/19    PT Start Time  8099    PT Stop Time  8338   shortened visit due to discharge   PT Time Calculation (min)  30 min       Past Medical History:  Diagnosis Date  . Anxiety    self reported  . Breast cancer ([place])   . Cataract   . Hypertension     Past Surgical History:  Procedure Laterality Date  . ABDOMINAL HYSTERECTOMY    . BREAST LUMPECTOMY  1988  . INTRAOCULAR PROSTHESES INSERTION  8/248/2005    There were no vitals filed for this visit.  Subjective Assessment - 07/02/19 1021    Subjective  " I am feeling pretty stiff in the knee, The shoulder is doing really well"    Currently in Pain?  No/denies    Pain Score  0-No pain    Aggravating Factors   N/A    Pain Relieving Factors  N/A         [facility] PT Assessment - 07/02/19 0001      Assessment   Medical Diagnosis  Chronic left shoulder pain     Referring Provider (PT)  Baldwin [place], Mike Craze, PA-C      Observation/Other Assessments   Focus on Therapeutic Outcomes (FOTO)   43% limited      AROM   Left Shoulder Extension  48 Degrees    Left Shoulder Flexion  128 Degrees    Left Shoulder ABduction  108 Degrees    Left Shoulder Internal Rotation  --   T9   Left Shoulder External Rotation  --   T4     Strength   Left Shoulder Flexion  4/5    Left Shoulder Extension  4+/5    Left Shoulder ABduction  4/5    Left Shoulder Internal Rotation  4+/5    Left Shoulder External Rotation  4/5    Left Hand Grip (lbs)  47.3   50,47,45                   [facility] Adult PT Treatment/Exercise - 07/02/19 0001      Shoulder Exercises: Seated   Extension  Strengthening;Both;10 reps;Theraband    Theraband Level (Shoulder Extension)  Level 3 (Green)    Row  Strengthening;15 reps;Theraband    Theraband Level (Shoulder Row)  Level 3 (Green)    Horizontal ABduction  Strengthening;Both;10 reps;Theraband    Theraband Level (Shoulder Horizontal ABduction)  Level 3 (Green)    External Rotation  Strengthening;Left;Theraband;15 reps    Theraband Level (Shoulder External Rotation)  Level 3 (Green)    Internal Rotation  15 reps;Theraband;Other (comment)    Theraband Level (Shoulder Internal Rotation)  Level 3 (Green)      Shoulder Exercises: Stretch   Cross Chest Stretch  2 reps;30 seconds    Other Shoulder Stretches  upper trap stretch 2 x 30 sec              PT Education - 07/02/19 1047  Education Details  reviewed previous provided HEP and discussed importance of consistency with exercises and how to progress reps/ sets to increase funcitonal strength/ endurance    Person(s) Educated  Patient    Methods  Explanation;Verbal cues;Handout    Comprehension  Verbalized understanding;Verbal cues required       PT Short Term Goals - 06/17/19 0930      PT SHORT TERM GOAL #1   Title  She will be independent with initial HEP    Status  Achieved      PT SHORT TERM GOAL #2   Title  pt to verbalize and demo proper posture with liftng and carrying mechanics to reduce and prevent L shoulder pain    Baseline  She is not able to lift with good mechanics due to back and knee pain but we discussed changing environment to limit bending  and to keep loads close and  not lift more than 20 # and less if painvul.    Status  Achieved      PT SHORT TERM GOAL #3   Title  increase L grip strength by >/= 8# to demo improving L shoulder function    Baseline  60,55,55    Status  Achieved        PT Long Term Goals - 07/02/19 1032       PT LONG TERM GOAL #1   Title  Increase L shoulder abduction and ER/IR to [facility] compared bil with no report of pain for functional ROM    Time  6    Period  Weeks    Status  Achieved      PT LONG TERM GOAL #2   Title  increase L shoulder strength to >/= 4/5 in all planes to promtoe shoulder stability with </= 2/10 pain noted during assessment    Period  Weeks    Status  Achieved      PT LONG TERM GOAL #3   Title  She will be able to lift 3 items of clothes from closet with 1-2 max pain    Period  Weeks    Status  Achieved      PT LONG TERM GOAL #4   Title  increase FOTO score to 42% limitation to demo improvement in function    Time  6    Status  Achieved      PT LONG TERM GOAL #5   Title  pt to be I with all HEP given as of last visit to maintain and progress current level of function    Period  Weeks    Status  Achieved            Plan - 07/02/19 1052    Clinical Impression Statement  Mrs Bratcher has made great progress with physical therapy increasing ROM and strength and additionally reports no pain. She was able to do all exercises well with minimal cues for form. she met all goals today and is able maintain her current level of function independently and will be discharged from PT today.    PT Treatment/Interventions  ADLs/Self Care Home Management;Cryotherapy;Therapeutic activities;Therapeutic exercise;Manual techniques;Dry needling;Taping;Patient/family education    PT Next Visit Plan  D/C    PT Home Exercise Plan  shoulder internal/external rotation, rows, upper trap stretch       Patient will benefit from skilled therapeutic intervention in order to improve the following deficits and impairments:  Pain, Improper body mechanics, Postural dysfunction, Impaired UE functional use, Decreased activity tolerance, Decreased  strength, Decreased endurance, Decreased range of motion, Increased fascial restricitons  Visit Diagnosis: Stiffness of left shoulder, not elsewhere  classified  Chronic left shoulder pain  Muscle weakness (generalized)     Problem List Patient Active Problem List   Diagnosis Date Noted  . Chronic right shoulder pain 11/27/2017  . Unilateral primary osteoarthritis, left knee 11/27/2017    [name] 07/02/2019, 10:54 AM  [facility] [address] [place], [place], [address] Phone: [phone]   Fax:  [phone]  Name: RHYLIN VENTERS MRN: [id number] Date of Birth: 1932-12-24       PHYSICAL THERAPY DISCHARGE SUMMARY  Visits from Start of Care: 7  Current functional level related to goals / functional outcomes: See goals, FOTO 43% limited   Remaining deficits: Intermittent stiffness in the L shoulder which improves with activity    Education / Equipment: HEP, theraband, posture and lifting mechanics  Plan: Patient agrees to discharge.  Patient goals were met. Patient is being discharged due to meeting the stated rehab goals.  ?????           PT, DPT, LAT, ATC  07/02/19  10:55 AM

## 2019-07-04 ENCOUNTER — Ambulatory Visit: Payer: Medicare Other | Admitting: Physical Therapy

## 2019-11-08 ENCOUNTER — Encounter: Payer: Self-pay | Admitting: Orthopaedic Surgery

## 2019-11-11 ENCOUNTER — Telehealth: Payer: Self-pay | Admitting: Orthopaedic Surgery

## 2019-11-11 NOTE — Telephone Encounter (Signed)
Tried to call patient to schedule appointment to see Dr.Whitfield. No answer. Left message that we have two openings tomorrow afternoon and requested that she call us back so that we can get her scheduled.

## 2019-11-12 ENCOUNTER — Other Ambulatory Visit: Payer: Self-pay

## 2019-11-12 ENCOUNTER — Encounter: Payer: Self-pay | Admitting: Orthopaedic Surgery

## 2019-11-12 ENCOUNTER — Ambulatory Visit (INDEPENDENT_AMBULATORY_CARE_PROVIDER_SITE_OTHER): Payer: Medicare Other | Admitting: Orthopaedic Surgery

## 2019-11-12 DIAGNOSIS — M1711 Unilateral primary osteoarthritis, right knee: Secondary | ICD-10-CM

## 2019-11-12 DIAGNOSIS — Z6841 Body Mass Index (BMI) 40.0 and over, adult: Secondary | ICD-10-CM

## 2019-11-12 DIAGNOSIS — M1712 Unilateral primary osteoarthritis, left knee: Secondary | ICD-10-CM

## 2019-11-12 DIAGNOSIS — E669 Obesity, unspecified: Secondary | ICD-10-CM | POA: Insufficient documentation

## 2019-11-12 DIAGNOSIS — M170 Bilateral primary osteoarthritis of knee: Secondary | ICD-10-CM | POA: Insufficient documentation

## 2019-11-12 MED ORDER — LIDOCAINE HCL 1 % IJ SOLN
2.0000 mL | INTRAMUSCULAR | Status: AC | PRN
  Administered 2019-11-12: 2 mL

## 2019-11-12 MED ORDER — BUPIVACAINE HCL 0.5 % IJ SOLN
2.0000 mL | INTRAMUSCULAR | Status: AC | PRN
  Administered 2019-11-12: 2 mL via INTRA_ARTICULAR

## 2019-11-12 MED ORDER — METHYLPREDNISOLONE ACETATE 40 MG/ML IJ SUSP
80.0000 mg | INTRAMUSCULAR | Status: AC | PRN
  Administered 2019-11-12: 80 mg via INTRA_ARTICULAR

## 2019-11-12 NOTE — Progress Notes (Signed)
Office Visit Note   Patient: Jillian Buchanan           Date of Birth: 10/31/1933           MRN: [id number] Visit Date: 11/12/2019              Requested by: Seward Carol, MD [address] [place],  [place] [address] PCP: Seward Carol, MD   Assessment & Plan: Visit Diagnoses:  1. Bilateral primary osteoarthritis of knee     Plan: Long discussion over 30 minutes regarding present problem with both of her knees explaining the diagnosis and treatment options.  Her BMI is just over 40 so she needs to work on her weight.  She also needs to work on exercises if possible.  We discussed minimal use of medicine.  I will inject her right knee with cortisone today and have her return in 3 to 4 weeks if she wishes to inject her left knee.  Follow-Up Instructions: Return if symptoms worsen or fail to improve.   Orders:  Orders Placed This Encounter  Procedures  . Large Joint Inj: R knee   No orders of the defined types were placed in this encounter.     Procedures: Large Joint Inj: R knee on 11/12/2019 3:26 PM Indications: pain and diagnostic evaluation Details: 25 G 1.5 in needle, anteromedial approach  Arthrogram: No  Medications: 2 mL lidocaine 1 %; 2 mL bupivacaine 0.5 %; 80 mg methylPREDNISolone acetate 40 MG/ML Procedure, treatment alternatives, risks and benefits explained, specific risks discussed. Consent was given by the patient. Immediately prior to procedure a time out was called to verify the correct patient, procedure, equipment, support staff and site/side marked as required. Patient was prepped and draped in the usual sterile fashion.       Clinical Data: No additional findings.   Subjective: Chief Complaint  Patient presents with  . Left Knee - Pain  . Right Knee - Pain  Patient presents today for bilateral knee pain. She was last here in July and August of last year and received cortisone injections in her knees. Patient states that her right  knee is hurting more than the left. She has swelling in both knees. She said that her right calf cramps. She is taking tylenol and aleve as needed.  Has had prior x-rays of her knees demonstrating tricompartmental degenerative changes with osteophytes and narrowed joint space.  HPI  Review of Systems  Constitutional: Positive for fatigue.  HENT: Negative for ear pain.   Eyes: Negative for pain.  Respiratory: Positive for shortness of breath.   Cardiovascular: Negative for leg swelling.  Gastrointestinal: Positive for diarrhea. Negative for constipation.  Endocrine: Negative for cold intolerance and heat intolerance.  Genitourinary: Negative for difficulty urinating.  Musculoskeletal: Positive for joint swelling.  Skin: Positive for rash.  Allergic/Immunologic: Negative for food allergies.  Neurological: Positive for weakness.  Hematological: Does not bruise/bleed easily.  Psychiatric/Behavioral: Negative for sleep disturbance.     Objective: Vital Signs: There were no vitals taken for this visit.  Physical Exam Constitutional:      Appearance: She is well-developed.  Eyes:     Pupils: Pupils are equal, round, and reactive to light.  Pulmonary:     Effort: Pulmonary effort is normal.  Skin:    General: Skin is warm and dry.  Neurological:     Mental Status: She is alert and oriented to person, place, and time.  Psychiatric:  Behavior: Behavior normal.     Ortho Exam week alert and oriented x3.  Comfortable sitting.  Very pleasant.  Large legs.  Full extension about 100 degrees of flexion bilaterally.  No obvious effusion.  No instability.  Positive patella crepitation.  Little bit more pain laterally in the right knee and medially in the left knee.  Mild nonpitting ankle edema.  Motor exam intact  Specialty Comments:  No specialty comments available.  Imaging: No results found.   PMFS History: Patient Active Problem List   Diagnosis Date Noted  . Bilateral  primary osteoarthritis of knee 11/12/2019  . Chronic right shoulder pain 11/27/2017  . Unilateral primary osteoarthritis, left knee 11/27/2017   Past Medical History:  Diagnosis Date  . Anxiety    self reported  . Breast cancer ([place])   . Cataract   . Hypertension     History reviewed. No pertinent family history.  Past Surgical History:  Procedure Laterality Date  . ABDOMINAL HYSTERECTOMY    . BREAST LUMPECTOMY  1988  . INTRAOCULAR PROSTHESES INSERTION  8/248/2005   Social History   Occupational History  . Not on file  Tobacco Use  . Smoking status: Never Smoker  . Smokeless tobacco: Never Used  Substance and Sexual Activity  . Alcohol use: No  . Drug use: No  . Sexual activity: Never

## 2019-11-27 ENCOUNTER — Ambulatory Visit: Payer: Medicare Other

## 2019-12-03 ENCOUNTER — Ambulatory Visit: Payer: Medicare Other | Admitting: Orthopaedic Surgery

## 2019-12-06 ENCOUNTER — Ambulatory Visit: Payer: Medicare Other | Attending: Internal Medicine

## 2019-12-06 DIAGNOSIS — Z23 Encounter for immunization: Secondary | ICD-10-CM | POA: Insufficient documentation

## 2019-12-06 NOTE — Progress Notes (Signed)
   Covid-19 Vaccination Clinic  Name:  Jillian Buchanan    MRN: [id number] DOB: 13-Jan-1933  12/06/2019  [name] was observed post Covid-19 immunization for 15 minutes without incidence. She was provided with Vaccine Information Sheet and instruction to access the V-Safe system.   [name] was instructed to call 911 with any severe reactions post vaccine: Marland Kitchen Difficulty breathing  . Swelling of your face and throat  . A fast heartbeat  . A bad rash all over your body  . Dizziness and weakness    Immunizations Administered    Name Date Dose VIS Date Route   Pfizer COVID-19 Vaccine 12/06/2019 10:57 AM 0.3 mL 10/11/2019 Intramuscular   Manufacturer: [place]   Lot: [id number]   [place]: [id number]

## 2019-12-18 ENCOUNTER — Ambulatory Visit: Payer: Medicare Other

## 2020-01-01 ENCOUNTER — Ambulatory Visit: Payer: Medicare Other | Attending: Internal Medicine

## 2020-01-01 DIAGNOSIS — Z23 Encounter for immunization: Secondary | ICD-10-CM | POA: Insufficient documentation

## 2020-01-01 NOTE — Progress Notes (Signed)
   Covid-19 Vaccination Clinic  Name:  Jillian Buchanan    MRN: [id number] DOB: 30-Sep-1933  01/01/2020  [name] was observed post Covid-19 immunization for 15 minutes without incident. She was provided with Vaccine Information Sheet and instruction to access the V-Safe system.   [name] was instructed to call 911 with any severe reactions post vaccine: Marland Kitchen Difficulty breathing  . Swelling of face and throat  . A fast heartbeat  . A bad rash all over body  . Dizziness and weakness   Immunizations Administered    Name Date Dose VIS Date Route   Pfizer COVID-19 Vaccine 01/01/2020 10:42 AM 0.3 mL 10/11/2019 Intramuscular   Manufacturer: [place]   Lot: [id number]   [place]: [id number]

## 2020-02-25 ENCOUNTER — Telehealth: Payer: Self-pay

## 2020-02-25 ENCOUNTER — Other Ambulatory Visit: Payer: Self-pay

## 2020-02-25 ENCOUNTER — Ambulatory Visit (INDEPENDENT_AMBULATORY_CARE_PROVIDER_SITE_OTHER): Payer: Medicare Other | Admitting: Orthopaedic Surgery

## 2020-02-25 ENCOUNTER — Encounter: Payer: Self-pay | Admitting: Orthopaedic Surgery

## 2020-02-25 VITALS — Ht 66.0 in | Wt 230.0 lb

## 2020-02-25 DIAGNOSIS — M1711 Unilateral primary osteoarthritis, right knee: Secondary | ICD-10-CM

## 2020-02-25 DIAGNOSIS — M170 Bilateral primary osteoarthritis of knee: Secondary | ICD-10-CM

## 2020-02-25 DIAGNOSIS — M1712 Unilateral primary osteoarthritis, left knee: Secondary | ICD-10-CM | POA: Diagnosis not present

## 2020-02-25 MED ORDER — BUPIVACAINE HCL 0.5 % IJ SOLN
2.0000 mL | INTRAMUSCULAR | Status: AC | PRN
  Administered 2020-02-25: 2 mL via INTRA_ARTICULAR

## 2020-02-25 MED ORDER — LIDOCAINE HCL 1 % IJ SOLN
2.0000 mL | INTRAMUSCULAR | Status: AC | PRN
  Administered 2020-02-25: 2 mL

## 2020-02-25 MED ORDER — METHYLPREDNISOLONE ACETATE 40 MG/ML IJ SUSP
80.0000 mg | INTRAMUSCULAR | Status: AC | PRN
  Administered 2020-02-25: 16:00:00 80 mg via INTRA_ARTICULAR

## 2020-02-25 NOTE — Telephone Encounter (Signed)
New start

## 2020-02-25 NOTE — Telephone Encounter (Signed)
Please precert for bilateral visco injections. This is Dr.Whitfied's patient.  Thanks!

## 2020-02-25 NOTE — Progress Notes (Signed)
Office Visit Note   Patient: Jillian Buchanan           Date of Birth: 1933/10/27           MRN: [id number] Visit Date: 02/25/2020              Requested by: Seward Carol, MD [address] [place],  [place] [address] PCP: Seward Carol, MD   Assessment & Plan: Visit Diagnoses:  1. Bilateral primary osteoarthritis of knee     Plan: Bilateral advanced osteoarthritis of the knees.  Will inject the left knee with cortisone today and pre-CERT for Visco supplementation both knees. This patient is diagnosed with osteoarthritis of the knee(s).    Radiographs show evidence of joint space narrowing, osteophytes, subchondral sclerosis and/or subchondral cysts.  This patient has knee pain which interferes with functional and activities of daily living.    This patient has experienced inadequate response, adverse effects and/or intolerance with conservative treatments such as acetaminophen, NSAIDS, topical creams, physical therapy or regular exercise, knee bracing and/or weight loss.   This patient has experienced inadequate response or has a contraindication to intra articular steroid injections for at least 3 months.   This patient is not scheduled to have a total knee replacement within 6 months of starting treatment with viscosupplementation.   Follow-Up Instructions: Return Pre-CERT for Visco supplementation.   Orders:  Orders Placed This Encounter  Procedures  . Large Joint Inj: L knee   No orders of the defined types were placed in this encounter.     Procedures: Large Joint Inj: L knee on 02/25/2020 3:37 PM Indications: pain and diagnostic evaluation Details: 25 G 1.5 in needle, anteromedial approach  Arthrogram: No  Medications: 2 mL lidocaine 1 %; 2 mL bupivacaine 0.5 %; 80 mg methylPREDNISolone acetate 40 MG/ML Procedure, treatment alternatives, risks and benefits explained, specific risks discussed. Consent was given by the patient. Patient was prepped and  draped in the usual sterile fashion.       Clinical Data: No additional findings.   Subjective: Chief Complaint  Patient presents with  . Right Knee - Pain  . Left Knee - Pain  Patient presents today for recurrent bilateral knee pain. She was last here in January and received a right knee cortisone injection. She said that the injection was great and helped for two months. The pain has returned and is in both knees. She said that the pain is constant and worsens with any movement. She is not diabetic. She takes Tylenol or Aleve as needed.   HPI  Review of Systems  Constitutional: Negative for fatigue.  HENT: Negative for ear pain.   Eyes: Negative for pain.  Respiratory: Negative for shortness of breath.   Cardiovascular: Positive for leg swelling.  Gastrointestinal: Negative for constipation and diarrhea.  Endocrine: Negative for cold intolerance and heat intolerance.  Genitourinary: Negative for difficulty urinating.  Musculoskeletal: Negative for joint swelling.  Skin: Negative for rash.  Allergic/Immunologic: Negative for food allergies.  Neurological: Positive for weakness.  Hematological: Does not bruise/bleed easily.  Psychiatric/Behavioral: Positive for sleep disturbance.     Objective: Vital Signs: Ht 5\' 6"  (1.676 m)   Wt 230 lb (104.3 kg)   BMI 37.12 kg/m   Physical Exam Constitutional:      Appearance: She is well-developed.  Eyes:     Pupils: Pupils are equal, round, and reactive to light.  Pulmonary:     Effort: Pulmonary effort is normal.  Skin:  General: Skin is warm and dry.  Neurological:     Mental Status: She is alert and oriented to person, place, and time.  Psychiatric:        Behavior: Behavior normal.     Ortho Exam awake alert and oriented x3.  Comfortable sitting.  Possibly small effusion both knees but knees are large.  Predominately lateral joint pain and more so on left than right.  No instability.  Full extension and about 95  to 100 degrees of flexion  Specialty Comments:  No specialty comments available.  Imaging: No results found.   PMFS History: Patient Active Problem List   Diagnosis Date Noted  . Bilateral primary osteoarthritis of knee 11/12/2019  . Obesity 11/12/2019  . Chronic right shoulder pain 11/27/2017  . Unilateral primary osteoarthritis, left knee 11/27/2017   Past Medical History:  Diagnosis Date  . Anxiety    self reported  . Breast cancer ([place])   . Cataract   . Hypertension     History reviewed. No pertinent family history.  Past Surgical History:  Procedure Laterality Date  . ABDOMINAL HYSTERECTOMY    . BREAST LUMPECTOMY  1988  . INTRAOCULAR PROSTHESES INSERTION  8/248/2005   Social History   Occupational History  . Not on file  Tobacco Use  . Smoking status: Never Smoker  . Smokeless tobacco: Never Used  Substance and Sexual Activity  . Alcohol use: No  . Drug use: No  . Sexual activity: Never

## 2020-02-26 NOTE — Telephone Encounter (Signed)
Submitted for VOB for Synvisc-Bilateral knee

## 2020-02-27 ENCOUNTER — Telehealth: Payer: Self-pay

## 2020-02-27 NOTE — Telephone Encounter (Signed)
Pt insurance requires prior auth for the gel injections. This was completed and faxed today. Waiting on auth status

## 2020-03-06 ENCOUNTER — Telehealth: Payer: Self-pay

## 2020-03-06 NOTE — Telephone Encounter (Signed)
error 

## 2020-03-10 ENCOUNTER — Telehealth: Payer: Self-pay

## 2020-03-10 NOTE — Telephone Encounter (Signed)
Received Auth for synvisc one not synvisc- resubmitted for correction.

## 2020-03-11 ENCOUNTER — Telehealth: Payer: Self-pay

## 2020-03-11 NOTE — Telephone Encounter (Signed)
Pt was called and informed and stated understanding  Pt is aware of copay

## 2020-03-11 NOTE — Telephone Encounter (Signed)
Approved for Synvisc-Bilateral knee Dr. Vicenta Dunning and Bill Covered @ 100 after $94 copay Prior auth required Auth # [facility] Dates: 03/10/20-09/07/20

## 2020-03-26 ENCOUNTER — Ambulatory Visit: Payer: Medicare Other | Admitting: Orthopaedic Surgery

## 2020-04-02 ENCOUNTER — Ambulatory Visit: Payer: Medicare Other | Admitting: Orthopaedic Surgery

## 2020-04-13 ENCOUNTER — Encounter: Payer: Self-pay | Admitting: Orthopaedic Surgery

## 2020-04-13 ENCOUNTER — Telehealth: Payer: Self-pay | Admitting: Orthopaedic Surgery

## 2020-04-13 NOTE — Telephone Encounter (Signed)
Spoke with patient. She will start her visco injections next week.

## 2020-04-13 NOTE — Telephone Encounter (Signed)
Patient called.   She is wondering if we could move her appointment for Friday to any other day this week.   Call back: [phone]

## 2020-04-14 ENCOUNTER — Ambulatory Visit (INDEPENDENT_AMBULATORY_CARE_PROVIDER_SITE_OTHER): Payer: Medicare Other | Admitting: Orthopaedic Surgery

## 2020-04-14 ENCOUNTER — Encounter: Payer: Self-pay | Admitting: Orthopaedic Surgery

## 2020-04-14 VITALS — Ht 66.0 in | Wt 230.0 lb

## 2020-04-14 DIAGNOSIS — M170 Bilateral primary osteoarthritis of knee: Secondary | ICD-10-CM | POA: Diagnosis not present

## 2020-04-14 MED ORDER — HYLAN G-F 20 16 MG/2ML IX SOSY
16.0000 mg | PREFILLED_SYRINGE | INTRA_ARTICULAR | Status: AC | PRN
Start: 2020-04-14 — End: 2020-04-14
  Administered 2020-04-14: 16 mg via INTRA_ARTICULAR

## 2020-04-14 MED ORDER — HYLAN G-F 20 16 MG/2ML IX SOSY
16.0000 mg | PREFILLED_SYRINGE | INTRA_ARTICULAR | Status: AC | PRN
  Administered 2020-04-14: 16 mg via INTRA_ARTICULAR

## 2020-04-14 NOTE — Progress Notes (Signed)
Office Visit Note   Patient: Jillian Buchanan           Date of Birth: 09-Jan-1933           MRN: [id number] Visit Date: 04/14/2020              Requested by: Seward Carol, MD [address] [place],  [place] [address] PCP: Seward Carol, MD   Assessment & Plan: Visit Diagnoses:  1. Bilateral primary osteoarthritis of knee     Plan: First Synvisc injection both knees.  Return weekly for the next 2 weeks to complete the series  Follow-Up Instructions: Return in about 1 week (around 04/21/2020).   Orders:  Orders Placed This Encounter  Procedures  . Large Joint Inj: bilateral knee   No orders of the defined types were placed in this encounter.     Procedures: Large Joint Inj: bilateral knee on 04/14/2020 2:04 PM Indications: pain and joint swelling Details: 25 G 1.5 in needle, anteromedial approach  Arthrogram: No  Medications (Right): 16 mg Hylan 16 MG/2ML Medications (Left): 16 mg Hylan 16 MG/2ML Outcome: tolerated well, no immediate complications Procedure, treatment alternatives, risks and benefits explained, specific risks discussed. Consent was given by the patient. Immediately prior to procedure a time out was called to verify the correct patient, procedure, equipment, support staff and site/side marked as required. Patient was prepped and draped in the usual sterile fashion.       Clinical Data: No additional findings.   Subjective: Chief Complaint  Patient presents with  . Right Knee - Follow-up    Synvisc injections bilaterally starting 04-14-2020  . Left Knee - Follow-up  Patient presents today for the first Synvisc injections bilaterally.   HPI  Review of Systems  Constitutional: Negative for fatigue.  HENT: Negative for ear pain.   Eyes: Negative for pain.  Respiratory: Negative for shortness of breath.   Cardiovascular: Negative for leg swelling.  Gastrointestinal: Positive for diarrhea. Negative for constipation.  Endocrine:  Negative for cold intolerance and heat intolerance.  Genitourinary: Negative for difficulty urinating.  Musculoskeletal: Positive for joint swelling.  Skin: Negative for rash.  Allergic/Immunologic: Negative for food allergies.  Neurological: Negative for weakness.  Hematological: Does not bruise/bleed easily.  Psychiatric/Behavioral: Negative for sleep disturbance.     Objective: Vital Signs: Ht 5\' 6"  (1.676 m)   Wt 230 lb (104.3 kg)   BMI 37.12 kg/m   Physical Exam  Ortho Exam relatively large knees.  No obvious effusion.  Predominately medial joint pain  Specialty Comments:  No specialty comments available.  Imaging: No results found.   PMFS History: Patient Active Problem List   Diagnosis Date Noted  . Bilateral primary osteoarthritis of knee 11/12/2019  . Obesity 11/12/2019  . Chronic right shoulder pain 11/27/2017  . Unilateral primary osteoarthritis, left knee 11/27/2017   Past Medical History:  Diagnosis Date  . Anxiety    self reported  . Breast cancer ([place])   . Cataract   . Hypertension     History reviewed. No pertinent family history.  Past Surgical History:  Procedure Laterality Date  . ABDOMINAL HYSTERECTOMY    . BREAST LUMPECTOMY  1988  . INTRAOCULAR PROSTHESES INSERTION  8/248/2005   Social History   Occupational History  . Not on file  Tobacco Use  . Smoking status: Never Smoker  . Smokeless tobacco: Never Used  Vaping Use  . Vaping Use: Never used  Substance and Sexual Activity  . Alcohol  use: No  . Drug use: No  . Sexual activity: Never

## 2020-04-16 ENCOUNTER — Ambulatory Visit: Payer: Medicare Other | Admitting: Orthopaedic Surgery

## 2020-04-23 ENCOUNTER — Ambulatory Visit (INDEPENDENT_AMBULATORY_CARE_PROVIDER_SITE_OTHER): Payer: Medicare Other | Admitting: Orthopaedic Surgery

## 2020-04-23 ENCOUNTER — Encounter: Payer: Self-pay | Admitting: Orthopaedic Surgery

## 2020-04-23 ENCOUNTER — Other Ambulatory Visit: Payer: Self-pay

## 2020-04-23 VITALS — Ht 66.0 in | Wt 230.0 lb

## 2020-04-23 DIAGNOSIS — M170 Bilateral primary osteoarthritis of knee: Secondary | ICD-10-CM

## 2020-04-23 MED ORDER — HYLAN G-F 20 16 MG/2ML IX SOSY
16.0000 mg | PREFILLED_SYRINGE | INTRA_ARTICULAR | Status: AC | PRN
  Administered 2020-04-23: 16 mg via INTRA_ARTICULAR

## 2020-04-23 NOTE — Progress Notes (Signed)
   Office Visit Note   Patient: Jillian Buchanan           Date of Birth: 1933-07-04           MRN: [id number] Visit Date: 04/23/2020              Requested by: Seward Carol, MD [address] [place] [place],  [place] [address] PCP: Seward Carol, MD   Assessment & Plan: Visit Diagnoses:  1. Bilateral primary osteoarthritis of knee     Plan: Second Synvisc injections both knees.  Has done well with the initial injections last week.  Return next week for the final injection Follow-Up Instructions: Return in about 1 week (around 04/30/2020).   Orders:  No orders of the defined types were placed in this encounter.  No orders of the defined types were placed in this encounter.     Procedures: Large Joint Inj: bilateral knee on 04/23/2020 10:50 AM Indications: pain and joint swelling Details: 25 G 1.5 in needle, anteromedial approach  Arthrogram: No  Medications (Right): 16 mg Hylan 16 MG/2ML Medications (Left): 16 mg Hylan 16 MG/2ML Outcome: tolerated well, no immediate complications Procedure, treatment alternatives, risks and benefits explained, specific risks discussed. Consent was given by the patient. Immediately prior to procedure a time out was called to verify the correct patient, procedure, equipment, support staff and site/side marked as required. Patient was prepped and draped in the usual sterile fashion.       Clinical Data: No additional findings.   Subjective: Chief Complaint  Patient presents with  . Left Knee - Follow-up    Bilateral synvisc started 04-14-2020  . Right Knee - Follow-up  Patient presents today for the second synvisc injection bilaterally. She started the series on 04-14-2020. She is doing well, no complaints.  HPI  Review of Systems   Objective: Vital Signs: Ht 5\' 6"  (1.676 m)   Wt 230 lb (104.3 kg)   BMI 37.12 kg/m   Physical Exam  Ortho Exam large knees.  Possibly very small effusion but the knees were not hot red warm  or swollen.  Minimal discomfort with ambulation Specialty Comments:  No specialty comments available.  Imaging: No results found.   PMFS History: Patient Active Problem List   Diagnosis Date Noted  . Bilateral primary osteoarthritis of knee 11/12/2019  . Obesity 11/12/2019  . Chronic right shoulder pain 11/27/2017  . Unilateral primary osteoarthritis, left knee 11/27/2017   Past Medical History:  Diagnosis Date  . Anxiety    self reported  . Breast cancer ([place])   . Cataract   . Hypertension     History reviewed. No pertinent family history.  Past Surgical History:  Procedure Laterality Date  . ABDOMINAL HYSTERECTOMY    . BREAST LUMPECTOMY  1988  . INTRAOCULAR PROSTHESES INSERTION  8/248/2005   Social History   Occupational History  . Not on file  Tobacco Use  . Smoking status: Never Smoker  . Smokeless tobacco: Never Used  Vaping Use  . Vaping Use: Never used  Substance and Sexual Activity  . Alcohol use: No  . Drug use: No  . Sexual activity: Never

## 2020-04-30 ENCOUNTER — Encounter: Payer: Self-pay | Admitting: Orthopaedic Surgery

## 2020-04-30 ENCOUNTER — Other Ambulatory Visit: Payer: Self-pay

## 2020-04-30 ENCOUNTER — Ambulatory Visit (INDEPENDENT_AMBULATORY_CARE_PROVIDER_SITE_OTHER): Payer: Medicare Other | Admitting: Orthopaedic Surgery

## 2020-04-30 DIAGNOSIS — M1712 Unilateral primary osteoarthritis, left knee: Secondary | ICD-10-CM | POA: Diagnosis not present

## 2020-04-30 DIAGNOSIS — M1711 Unilateral primary osteoarthritis, right knee: Secondary | ICD-10-CM | POA: Diagnosis not present

## 2020-04-30 DIAGNOSIS — M170 Bilateral primary osteoarthritis of knee: Secondary | ICD-10-CM

## 2020-04-30 MED ORDER — HYLAN G-F 20 16 MG/2ML IX SOSY
16.0000 mg | PREFILLED_SYRINGE | INTRA_ARTICULAR | Status: AC | PRN
  Administered 2020-04-30: 16 mg via INTRA_ARTICULAR

## 2020-04-30 NOTE — Progress Notes (Signed)
   Office Visit Note   Patient: Jillian Buchanan           Date of Birth: [birth year]           MRN: [id number] Visit Date: 04/30/2020              Requested by: Seward Carol, MD [address] [place],  [place] [address] PCP: Seward Carol, MD   Assessment & Plan: Visit Diagnoses:  1. Bilateral primary osteoarthritis of knee     Plan: Third and final Synvisc injections both knees.  Has done well with the previous injections with good relief of her pain  Follow-Up Instructions: Return if symptoms worsen or fail to improve.   Orders:  No orders of the defined types were placed in this encounter.  No orders of the defined types were placed in this encounter.     Procedures: Large Joint Inj: bilateral knee on 04/30/2020 2:16 PM Indications: pain and joint swelling Details: 25 G 1.5 in needle, anteromedial approach  Arthrogram: No  Medications (Right): 16 mg Hylan 16 MG/2ML Medications (Left): 16 mg Hylan 16 MG/2ML Outcome: tolerated well, no immediate complications Procedure, treatment alternatives, risks and benefits explained, specific risks discussed. Consent was given by the patient. Immediately prior to procedure a time out was called to verify the correct patient, procedure, equipment, support staff and site/side marked as required. Patient was prepped and draped in the usual sterile fashion.       Clinical Data: No additional findings.   Subjective: Chief Complaint  Patient presents with  . Right Knee - Pain  . Left Knee - Pain  Has had good relief with the 2 prior Synvisc injection to both knees.  Would like to proceed with the third and final injection  HPI  Review of Systems   Objective: Vital Signs: There were no vitals taken for this visit.  Physical Exam  Ortho Exam Large knees.  Knees were not swollen or hot.  No obvious effusion.  No significant medial or lateral joint pain. Specialty Comments:  No specialty comments  available.  Imaging: No results found.   PMFS History: Patient Active Problem List   Diagnosis Date Noted  . Bilateral primary osteoarthritis of knee 11/12/2019  . Obesity 11/12/2019  . Chronic right shoulder pain 11/27/2017  . Unilateral primary osteoarthritis, left knee 11/27/2017   Past Medical History:  Diagnosis Date  . Anxiety    self reported  . Breast cancer ([place])   . Cataract   . Hypertension     History reviewed. No pertinent family history.  Past Surgical History:  Procedure Laterality Date  . ABDOMINAL HYSTERECTOMY    . BREAST LUMPECTOMY  1988  . INTRAOCULAR PROSTHESES INSERTION  8/248/2005   Social History   Occupational History  . Not on file  Tobacco Use  . Smoking status: Never Smoker  . Smokeless tobacco: Never Used  Vaping Use  . Vaping Use: Never used  Substance and Sexual Activity  . Alcohol use: No  . Drug use: No  . Sexual activity: Never     Garald Balding, MD   Note - This record has been created using [employer].  Chart creation errors have been sought, but may not always  have been located. Such creation errors do not reflect on  the standard of medical care.

## 2020-09-12 ENCOUNTER — Ambulatory Visit: Payer: Medicare Other | Attending: Internal Medicine

## 2020-09-12 DIAGNOSIS — Z23 Encounter for immunization: Secondary | ICD-10-CM

## 2020-09-12 NOTE — Progress Notes (Signed)
   Covid-19 Vaccination Clinic  Name:  Jillian Buchanan    MRN: [id number] DOB: 08/11/1933  09/12/2020  [name] was observed post Covid-19 immunization for 15 minutes without incident. She was provided with Vaccine Information Sheet and instruction to access the V-Safe system.   [name] was instructed to call 911 with any severe reactions post vaccine: Marland Kitchen Difficulty breathing  . Swelling of face and throat  . A fast heartbeat  . A bad rash all over body  . Dizziness and weakness   Immunizations Administered    Name Date Dose VIS Date Route   Pfizer COVID-19 Vaccine 09/12/2020 12:21 PM 0.3 mL 08/19/2020 Intramuscular   Manufacturer: [place]   Lot: [id number]   [place]: [id number]

## 2020-12-22 DIAGNOSIS — E039 Hypothyroidism, unspecified: Secondary | ICD-10-CM | POA: Diagnosis not present

## 2020-12-22 DIAGNOSIS — Z853 Personal history of malignant neoplasm of breast: Secondary | ICD-10-CM | POA: Diagnosis not present

## 2020-12-22 DIAGNOSIS — I10 Essential (primary) hypertension: Secondary | ICD-10-CM | POA: Diagnosis not present

## 2020-12-22 DIAGNOSIS — E7800 Pure hypercholesterolemia, unspecified: Secondary | ICD-10-CM | POA: Diagnosis not present

## 2021-01-27 DIAGNOSIS — I10 Essential (primary) hypertension: Secondary | ICD-10-CM | POA: Diagnosis not present

## 2021-01-27 DIAGNOSIS — E7800 Pure hypercholesterolemia, unspecified: Secondary | ICD-10-CM | POA: Diagnosis not present

## 2021-01-27 DIAGNOSIS — Z0000 Encounter for general adult medical examination without abnormal findings: Secondary | ICD-10-CM | POA: Diagnosis not present

## 2021-01-27 DIAGNOSIS — E039 Hypothyroidism, unspecified: Secondary | ICD-10-CM | POA: Diagnosis not present

## 2021-02-08 DIAGNOSIS — E039 Hypothyroidism, unspecified: Secondary | ICD-10-CM | POA: Diagnosis not present

## 2021-02-08 DIAGNOSIS — I10 Essential (primary) hypertension: Secondary | ICD-10-CM | POA: Diagnosis not present

## 2021-02-08 DIAGNOSIS — E7800 Pure hypercholesterolemia, unspecified: Secondary | ICD-10-CM | POA: Diagnosis not present

## 2021-03-12 DIAGNOSIS — E039 Hypothyroidism, unspecified: Secondary | ICD-10-CM | POA: Diagnosis not present

## 2021-03-12 DIAGNOSIS — E7800 Pure hypercholesterolemia, unspecified: Secondary | ICD-10-CM | POA: Diagnosis not present

## 2021-03-12 DIAGNOSIS — Z853 Personal history of malignant neoplasm of breast: Secondary | ICD-10-CM | POA: Diagnosis not present

## 2021-03-12 DIAGNOSIS — I10 Essential (primary) hypertension: Secondary | ICD-10-CM | POA: Diagnosis not present

## 2021-04-07 ENCOUNTER — Ambulatory Visit (INDEPENDENT_AMBULATORY_CARE_PROVIDER_SITE_OTHER): Payer: Medicare Other | Admitting: Orthopaedic Surgery

## 2021-04-07 ENCOUNTER — Other Ambulatory Visit: Payer: Self-pay

## 2021-04-07 ENCOUNTER — Encounter: Payer: Self-pay | Admitting: Orthopaedic Surgery

## 2021-04-07 ENCOUNTER — Telehealth: Payer: Self-pay

## 2021-04-07 DIAGNOSIS — M1712 Unilateral primary osteoarthritis, left knee: Secondary | ICD-10-CM | POA: Diagnosis not present

## 2021-04-07 DIAGNOSIS — M170 Bilateral primary osteoarthritis of knee: Secondary | ICD-10-CM

## 2021-04-07 DIAGNOSIS — M1711 Unilateral primary osteoarthritis, right knee: Secondary | ICD-10-CM | POA: Diagnosis not present

## 2021-04-07 MED ORDER — BUPIVACAINE HCL 0.25 % IJ SOLN
2.0000 mL | INTRAMUSCULAR | Status: AC | PRN
  Administered 2021-04-07: 2 mL via INTRA_ARTICULAR

## 2021-04-07 MED ORDER — LIDOCAINE HCL 1 % IJ SOLN
2.0000 mL | INTRAMUSCULAR | Status: AC | PRN
  Administered 2021-04-07: 2 mL

## 2021-04-07 NOTE — Telephone Encounter (Signed)
Please get auth for bilateral knee gel injections-dr whitfield pt.

## 2021-04-07 NOTE — Progress Notes (Signed)
Office Visit Note   Patient: Jillian Buchanan           Date of Birth: December 23, 1932           MRN: [id number] Visit Date: 04/07/2021              Requested by: Jillian Carol, MD [address] [place],  [place] [address] PCP: Jillian Carol, MD   Assessment & Plan: Visit Diagnoses:  1. Bilateral primary osteoarthritis of knee     Plan: Recurrent symptoms of osteoarthritis bilateral knees.  We will repeat betamethasone injections.  Patient also would like to proceed with viscosupplementation.  We will pre-CERT This patient is diagnosed with osteoarthritis of the knee(s).    Radiographs show evidence of joint space narrowing, osteophytes, subchondral sclerosis and/or subchondral cysts.  This patient has knee pain which interferes with functional and activities of daily living.    This patient has experienced inadequate response, adverse effects and/or intolerance with conservative treatments such as acetaminophen, NSAIDS, topical creams, physical therapy or regular exercise, knee bracing and/or weight loss.   This patient has experienced inadequate response or has a contraindication to intra articular steroid injections for at least 3 months.   This patient is not scheduled to have a total knee replacement within 6 months of starting treatment with viscosupplementation.   Follow-Up Instructions: Return We will pre-CERT for viscosupplementation.   Orders:  No orders of the defined types were placed in this encounter.  No orders of the defined types were placed in this encounter.     Procedures: Large Joint Inj: bilateral knee on 04/07/2021 10:44 AM Indications: diagnostic evaluation Details: 25 G 1.5 in needle, anteromedial approach  Arthrogram: No  Medications (Right): 2 mL lidocaine 1 %; 2 mL bupivacaine 0.25 % Medications (Left): 2 mL lidocaine 1 %; 2 mL bupivacaine 0.25 %  12 mg betamethasone injected in the medial compartment of both knees with Xylocaine and  Marcaine without problem Consent was given by the patient. Immediately prior to procedure a time out was called to verify the correct patient, procedure, equipment, support staff and site/side marked as required. Patient was prepped and draped in the usual sterile fashion.       Clinical Data: No additional findings.   Subjective: Chief Complaint  Patient presents with  . Left Knee - Pain  . Right Knee - Pain  . Right Middle Finger - Pain  Jillian Buchanan is having recurrent symptoms of osteoarthritis of both knees.  Has had prior films demonstrating osteoarthritis.  Has done well with cortisone in the past.  Would like to proceed with viscosupplementation as well  HPI  Review of Systems   Objective: Vital Signs: There were no vitals taken for this visit.  Physical Exam Constitutional:      Appearance: She is well-developed.  Eyes:     Pupils: Pupils are equal, round, and reactive to light.  Pulmonary:     Effort: Pulmonary effort is normal.  Skin:    General: Skin is warm and dry.  Neurological:     Mental Status: She is alert and oriented to person, place, and time.  Psychiatric:        Behavior: Behavior normal.     Ortho Exam large knees.  Difficult to know whether there is an effusion but mostly medial joint pain.  Full extension about 95 to 100 degrees of flexion without instability.  Mostly medial joint pain  Specialty Comments:  No specialty comments available.  Imaging: No results found.   PMFS History: Patient Active Problem List   Diagnosis Date Noted  . Bilateral primary osteoarthritis of knee 11/12/2019  . Obesity 11/12/2019  . Chronic right shoulder pain 11/27/2017  . Unilateral primary osteoarthritis, left knee 11/27/2017   Past Medical History:  Diagnosis Date  . Anxiety    self reported  . Breast cancer ([place])   . Cataract   . Hypertension     History reviewed. No pertinent family history.  Past Surgical History:  Procedure Laterality  Date  . ABDOMINAL HYSTERECTOMY    . BREAST LUMPECTOMY  1988  . INTRAOCULAR PROSTHESES INSERTION  8/248/2005   Social History   Occupational History  . Not on file  Tobacco Use  . Smoking status: Never Smoker  . Smokeless tobacco: Never Used  Vaping Use  . Vaping Use: Never used  Substance and Sexual Activity  . Alcohol use: No  . Drug use: No  . Sexual activity: Never     Jillian Balding, MD   Note - This record has been created using [employer].  Chart creation errors have been sought, but may not always  have been located. Such creation errors do not reflect on  the standard of medical care.

## 2021-04-09 NOTE — Telephone Encounter (Signed)
Noted  

## 2021-04-27 DIAGNOSIS — Z13820 Encounter for screening for osteoporosis: Secondary | ICD-10-CM | POA: Diagnosis not present

## 2021-04-27 DIAGNOSIS — Z1231 Encounter for screening mammogram for malignant neoplasm of breast: Secondary | ICD-10-CM | POA: Diagnosis not present

## 2021-05-07 ENCOUNTER — Telehealth: Payer: Self-pay

## 2021-05-07 DIAGNOSIS — I10 Essential (primary) hypertension: Secondary | ICD-10-CM | POA: Diagnosis not present

## 2021-05-07 DIAGNOSIS — E039 Hypothyroidism, unspecified: Secondary | ICD-10-CM | POA: Diagnosis not present

## 2021-05-07 DIAGNOSIS — E7800 Pure hypercholesterolemia, unspecified: Secondary | ICD-10-CM | POA: Diagnosis not present

## 2021-05-07 NOTE — Telephone Encounter (Signed)
VOB has been submitted for Synvisc series, bilateral knee. Pending BV.

## 2021-05-08 ENCOUNTER — Emergency Department (HOSPITAL_BASED_OUTPATIENT_CLINIC_OR_DEPARTMENT_OTHER): Payer: Medicare Other

## 2021-05-08 ENCOUNTER — Encounter (HOSPITAL_BASED_OUTPATIENT_CLINIC_OR_DEPARTMENT_OTHER): Payer: Self-pay | Admitting: *Deleted

## 2021-05-08 ENCOUNTER — Emergency Department (HOSPITAL_BASED_OUTPATIENT_CLINIC_OR_DEPARTMENT_OTHER)
Admission: EM | Admit: 2021-05-08 | Discharge: 2021-05-08 | Disposition: A | Discharge status: Home / Self Care | Payer: Medicare Other | Attending: Emergency Medicine | Admitting: Emergency Medicine

## 2021-05-08 ENCOUNTER — Other Ambulatory Visit: Payer: Self-pay

## 2021-05-08 DIAGNOSIS — M79662 Pain in left lower leg: Secondary | ICD-10-CM | POA: Insufficient documentation

## 2021-05-08 DIAGNOSIS — Z79899 Other long term (current) drug therapy: Secondary | ICD-10-CM | POA: Diagnosis not present

## 2021-05-08 DIAGNOSIS — M79605 Pain in left leg: Secondary | ICD-10-CM

## 2021-05-08 DIAGNOSIS — I10 Essential (primary) hypertension: Secondary | ICD-10-CM | POA: Insufficient documentation

## 2021-05-08 DIAGNOSIS — Z853 Personal history of malignant neoplasm of breast: Secondary | ICD-10-CM | POA: Diagnosis not present

## 2021-05-08 DIAGNOSIS — Z7982 Long term (current) use of aspirin: Secondary | ICD-10-CM | POA: Diagnosis not present

## 2021-05-08 NOTE — ED Notes (Signed)
Pt in US now

## 2021-05-08 NOTE — ED Triage Notes (Signed)
Pain in lower lateral left leg starting today. Edema noted in foot and leg. chronic

## 2021-05-08 NOTE — Discharge Instructions (Addendum)
Recommend 1000 mg of Tylenol every 6 hours as needed for pain.  Recommend using over-the-counter lidocaine patches as well.  Follow-up with primary care doctor if pain is persistent.

## 2021-05-08 NOTE — ED Provider Notes (Signed)
[facility] EMERGENCY DEPT Provider Note   CSN: [id number] Arrival date & time: 05/08/21  1555     History Chief Complaint  Patient presents with   Leg Pain    Jillian Buchanan is a 87 y.o. female.  Sent here for DVT study of left lower leg by primary doctor.  Had some left calf pain today but has now resolved.  Has some chronic swelling in her legs from venous stasis.  Wears compression socks bilaterally.  The history is provided by the patient.  Leg Pain Lower extremity pain location: LEFT CALF. Time since incident:  5 hours Injury: no   Pain details:    Quality:  Aching   Radiates to:  Does not radiate   Severity:  Mild   Onset quality:  Gradual   Progression:  Resolved Chronicity:  New Relieved by:  Nothing Worsened by:  Nothing Associated symptoms: swelling (chronic)   Associated symptoms: no back pain, no decreased ROM, no fatigue, no fever, no itching, no muscle weakness, no neck pain, no numbness, no stiffness and no tingling       Past Medical History:  Diagnosis Date   Anxiety    self reported   Breast cancer ([place])    Cataract    Hypertension     Patient Active Problem List   Diagnosis Date Noted   Bilateral primary osteoarthritis of knee 11/12/2019   Obesity 11/12/2019   Chronic right shoulder pain 11/27/2017   Unilateral primary osteoarthritis, left knee 11/27/2017    Past Surgical History:  Procedure Laterality Date   ABDOMINAL HYSTERECTOMY     BREAST [place]  8/248/2005     OB History   No obstetric history on file.     No family history on file.  Social History   Tobacco Use   Smoking status: Never   Smokeless tobacco: Never  Vaping Use   Vaping Use: Never used  Substance Use Topics   Alcohol use: No   Drug use: No    Home Medications Prior to Admission medications   Medication Sig Start Date End Date Taking? Authorizing Provider  acetaminophen (TYLENOL) 325 MG tablet   05/02/19   [provider]  ALPRAZolam (XANAX) 0.5 MG tablet TAKE 1 TABLET BY MOUTH EVERY DAY AS NEEDED FOR ANXIETY 10/30/17   [provider]  amLODipine (NORVASC) 5 MG tablet Take 5 mg by mouth daily.      [provider]  aspirin 81 MG EC tablet TAKE ONE (1) TABLET EACH DAY 01/24/18   [provider]  bumetanide (BUMEX) 0.5 MG tablet Take 0.5 mg by mouth daily.      [provider]  diclofenac sodium (VOLTAREN) 1 % GEL Apply 2 g topically 4 (four) times daily. 02/20/18   Garald Balding, MD  hydroquinone 4 % cream AS DIRECTED TO AFFECTED AREA TWICE A DAY AS NEEDED EXTERNALLY 01/01/19   [provider]  levothyroxine (SYNTHROID, LEVOTHROID) 88 MCG tablet  04/29/17   [provider]  losartan (COZAAR) 100 MG tablet  04/29/17   [provider]  Losartan Potassium-HCTZ (HYZAAR PO) Take by mouth daily.      [provider]  naproxen sodium (ALEVE) 220 MG tablet  05/01/19   [provider]  triamcinolone cream (KENALOG) 0.5 % APPLY TO AFFECTED AREA OF LEG TWICE A DAY AS NEEDED 01/31/18   [provider]    Allergies    Patient has no  known allergies.  Review of Systems   Review of Systems  Constitutional:  Negative for fatigue and fever.  Cardiovascular:  Positive for leg swelling.  Musculoskeletal:  Positive for myalgias. Negative for arthralgias, back pain, gait problem, joint swelling, neck pain, neck stiffness and stiffness.  Skin:  Negative for itching.  Neurological:  Negative for weakness and numbness.   Physical Exam Updated Vital Signs BP (!) 165/78 (BP Location: Right Arm)   Pulse (!) 55   Temp 97.7 F (36.5 C)   Resp 18   Ht 5\' 4"  (1.626 m)   Wt 98.4 kg   SpO2 100%   BMI 37.25 kg/m   Physical Exam Constitutional:      General: She is not in acute distress.    Appearance: She is not ill-appearing.  HENT:     Head: Normocephalic.  Cardiovascular:     Pulses: Normal pulses.   Musculoskeletal:        General: Tenderness (mild tenderness to left calf) present. Normal range of motion.     Right lower leg: Edema (trace) present.     Left lower leg: Edema (trace) present.  Skin:    Capillary Refill: Capillary refill takes less than 2 seconds.  Neurological:     General: No focal deficit present.     Mental Status: She is alert.     Sensory: No sensory deficit.     Motor: No weakness.    ED Results / Procedures / Treatments   Labs (all labs ordered are listed, but only abnormal results are displayed) Labs Reviewed - No data to display  EKG None  Radiology US Venous Img Lower  Left (DVT Study)  Result Date: 05/08/2021 CLINICAL DATA:  Pain EXAM: LEFT LOWER EXTREMITY VENOUS DOPPLER ULTRASOUND TECHNIQUE: Gray-scale sonography with compression, as well as color and duplex ultrasound, were performed to evaluate the deep venous system(s) from the level of the common femoral vein through the popliteal and proximal calf veins. COMPARISON:  06/19/2019 FINDINGS: VENOUS Normal compressibility of the common femoral, superficial femoral, and popliteal veins, as well as the visualized calf veins. Visualized portions of profunda femoral vein and great saphenous vein unremarkable. No filling defects to suggest DVT on grayscale or color Doppler imaging. Doppler waveforms show normal direction of venous flow, normal respiratory plasticity and response to augmentation. Limited views of the contralateral common femoral vein are unremarkable. OTHER None. Limitations: none IMPRESSION: Negative examination for deep venous thrombosis in the left lower extremity. Electronically Signed   By: Eddie Candle M.D.   On: 05/08/2021 17:34    Procedures Procedures   Medications Ordered in ED Medications - No data to display  ED Course  I have reviewed the triage vital signs and the nursing notes.  Pertinent labs & imaging results that were available during my care of the patient were reviewed  by me and considered in my medical decision making (see chart for details).    MDM Rules/Calculators/A&P                          ROBENA EWY is here for DVT study.  Having some pain to the left calf today but has now resolved.  She has some venous stasis on exam but neurovascularly neuro muscularly intact in the lower extremities.  She has good palpable pulses bilaterally.  There is no redness or inflammation in the lower legs.  DVT study was negative.  Overall suspect may be a musculoskeletal  issue.  No concern for infectious process.  Not having any active pain now.  Good pulses and doubt peripheral arterial process.  Given reassurance and discharged in ED in good condition.  Recommend Tylenol and lidocaine patch and primary care follow-up.  This chart was dictated using voice recognition software.  Despite best efforts to proofread,  errors can occur which can change the documentation meaning.   Final Clinical Impression(s) / ED Diagnoses Final diagnoses:  Pain of left lower extremity    Rx / DC Orders ED Discharge Orders     None        Lennice Sites, DO 05/08/21 1836

## 2021-05-08 NOTE — ED Notes (Signed)
Patient transported to Ultrasound 

## 2021-05-11 ENCOUNTER — Telehealth: Payer: Self-pay

## 2021-05-11 NOTE — Telephone Encounter (Signed)
VOB still pending per [facility] portal.

## 2021-05-12 DIAGNOSIS — R922 Inconclusive mammogram: Secondary | ICD-10-CM | POA: Diagnosis not present

## 2021-05-12 DIAGNOSIS — R928 Other abnormal and inconclusive findings on diagnostic imaging of breast: Secondary | ICD-10-CM | POA: Diagnosis not present

## 2021-05-18 DIAGNOSIS — C50212 Malignant neoplasm of upper-inner quadrant of left female breast: Secondary | ICD-10-CM | POA: Diagnosis not present

## 2021-05-18 DIAGNOSIS — C50812 Malignant neoplasm of overlapping sites of left female breast: Secondary | ICD-10-CM | POA: Diagnosis not present

## 2021-05-20 ENCOUNTER — Encounter: Payer: Self-pay | Admitting: Internal Medicine

## 2021-05-20 ENCOUNTER — Telehealth: Payer: Self-pay | Admitting: Oncology

## 2021-05-20 NOTE — Telephone Encounter (Signed)
Left message for patient to return call for upcoming morning clinic appointment for 7/27

## 2021-05-24 ENCOUNTER — Encounter: Payer: Self-pay | Admitting: *Deleted

## 2021-05-24 ENCOUNTER — Other Ambulatory Visit: Payer: Self-pay | Admitting: *Deleted

## 2021-05-24 DIAGNOSIS — M79605 Pain in left leg: Secondary | ICD-10-CM | POA: Diagnosis not present

## 2021-05-24 DIAGNOSIS — C50212 Malignant neoplasm of upper-inner quadrant of left female breast: Secondary | ICD-10-CM | POA: Insufficient documentation

## 2021-05-25 NOTE — Progress Notes (Signed)
Radiation Oncology         [phone] ________________________________  Initial Outpatient Consultation  Name: Jillian Buchanan MRN: [id number]  Date: 05/26/2021  DOB: Nov 08, 1932  [id number], Jillian Moll, MD  Jillian Bookbinder, MD   REFERRING PHYSICIAN: Rolm Bookbinder, MD  DIAGNOSIS:    ICD-10-CM   1. Malignant neoplasm of upper-inner quadrant of left breast in female, estrogen receptor positive [facility])  C50.212    Z17.0      Cancer Staging Malignant neoplasm of upper-inner quadrant of left breast in female, estrogen receptor positive ([place]) Staging form: Breast, AJCC 8th Edition - Clinical stage from 05/26/2021: Stage IA (cT1b, cN0, cM0, G2, ER+, PR+, HER2+) - Unsigned Stage prefix: Initial diagnosis Histologic grading system: 3 grade system Laterality: Left Staged by: Pathologist and managing physician Stage used in treatment planning: Yes National guidelines used in treatment planning: Yes Type of national guideline used in treatment planning: NCCN   CHIEF COMPLAINT: Here to discuss management of left breast cancer  HISTORY OF PRESENT ILLNESS::Jillian Buchanan is a 87 y.o. female who presented with left breast abnormality on the following imaging: bilateral screening mammogram on 04/27/21. Diagnostic left breast mammogram at [facility] on the date of 05/12/21 further revealed a 1 cm oval mass in the left breast; noted indeterminate. Ultrasound of left breast on 05/12/21 at [facility] revealed the 0.8 cm x 0.8 cm x 0.5 cm lobulated mass in the left breast to be suspicious of malignancy.  Axilla negative on ultrasound  Biopsy reviewed at tumor board today revealed invasive ductal carcinoma, grade 2, with DCIS.  It is ER 100% positive, PR 2% positive, HER2 positive (borderline, Dr. Jana Hakim plans to repeat this)  The patient has a history of upper outer quadrant left breast cancer in the 1980s for which she received adjuvant radiation at [facility]; she believes she received 33 treatments.  She is here  with her husband and son today.  PREVIOUS RADIATION THERAPY: Yes as above to left breast in the 1980s  PAST MEDICAL HISTORY:  has a past medical history of Anxiety, Breast cancer ([place]), Cataract, Family history of bone cancer, Family history of breast cancer, and Hypertension.  (History of left breast cancer; received left breast lumpectomy at age 15).  PAST SURGICAL HISTORY: Past Surgical History:  Procedure Laterality Date   ABDOMINAL HYSTERECTOMY     BREAST LUMPECTOMY  1988   INTRAOCULAR PROSTHESES INSERTION  8/248/2005    FAMILY HISTORY: family history includes Bone cancer (age of onset: 50) in her daughter; Breast cancer in her cousin, maternal aunt, and paternal aunt.  SOCIAL HISTORY:  reports that she has never smoked. She has never used smokeless tobacco. She reports that she does not drink alcohol and does not use drugs.  ALLERGIES: Patient has no known allergies.  MEDICATIONS:  Current Outpatient Medications  Medication Sig Dispense Refill   acetaminophen (TYLENOL) 325 MG tablet  (Patient not taking: Reported on 05/26/2021)     ALPRAZolam (XANAX) 0.5 MG tablet TAKE 1 TABLET BY MOUTH EVERY DAY AS NEEDED FOR ANXIETY  3   amLODipine (NORVASC) 5 MG tablet Take 5 mg by mouth daily.       aspirin 81 MG EC tablet TAKE ONE (1) TABLET EACH DAY  3   bumetanide (BUMEX) 0.5 MG tablet Take 0.5 mg by mouth daily.       diclofenac sodium (VOLTAREN) 1 % GEL Apply 2 g topically 4 (four) times daily. 3 Tube 2   hydroquinone 4 % cream AS DIRECTED TO AFFECTED  AREA TWICE A DAY AS NEEDED EXTERNALLY     levothyroxine (SYNTHROID, LEVOTHROID) 88 MCG tablet      losartan (COZAAR) 100 MG tablet      naproxen sodium (ALEVE) 220 MG tablet      rosuvastatin (CRESTOR) 5 MG tablet Take 5 mg by mouth daily.     triamcinolone cream (KENALOG) 0.5 % APPLY TO AFFECTED AREA OF LEG TWICE A DAY AS NEEDED  3   No current facility-administered medications for this encounter.    REVIEW OF SYSTEMS: As  above  PHYSICAL EXAM:  vitals were not taken for this visit.   General: Alert and oriented, in no acute distress Psychiatric: Judgment and insight are intact. Affect is appropriate. ECOG = 1  0 - Asymptomatic (Fully active, able to carry on all predisease activities without restriction)  1 - Symptomatic but completely ambulatory (Restricted in physically strenuous activity but ambulatory and able to carry out work of a light or sedentary nature. For example, light housework, office work)  2 - Symptomatic, <50% in bed during the day (Ambulatory and capable of all self care but unable to carry out any work activities. Up and about more than 50% of waking hours)  3 - Symptomatic, >50% in bed, but not bedbound (Capable of only limited self-care, confined to bed or chair 50% or more of waking hours)  4 - Bedbound (Completely disabled. Cannot carry on any self-care. Totally confined to bed or chair)  5 - Death   Eustace Pen MM, Creech RH, Tormey DC, et al. [phone]). "Toxicity and response criteria of the [facility]". [place] Oncol. 5 (6): 649-55   LABORATORY DATA:  Lab Results  Component Value Date   WBC 4.2 05/26/2021   HGB 12.2 05/26/2021   HCT 37.1 05/26/2021   MCV 88.8 05/26/2021   PLT 206 05/26/2021   CMP     Component Value Date/Time   NA 139 05/26/2021 0854   K 3.9 05/26/2021 0854   CL 105 05/26/2021 0854   CO2 26 05/26/2021 0854   GLUCOSE 110 (H) 05/26/2021 0854   BUN 18 05/26/2021 0854   CREATININE 0.89 05/26/2021 0854   CALCIUM 9.4 05/26/2021 0854   PROT 7.4 05/26/2021 0854   ALBUMIN 3.6 05/26/2021 0854   AST 14 (L) 05/26/2021 0854   ALT 8 05/26/2021 0854   ALKPHOS 66 05/26/2021 0854   BILITOT 0.4 05/26/2021 0854   GFRNONAA >60 05/26/2021 0854         RADIOGRAPHY: US Venous Img Lower  Left (DVT Study)  Result Date: 05/08/2021 CLINICAL DATA:  Pain EXAM: LEFT LOWER EXTREMITY VENOUS DOPPLER ULTRASOUND TECHNIQUE: Gray-scale sonography with  compression, as well as color and duplex ultrasound, were performed to evaluate the deep venous system(s) from the level of the common femoral vein through the popliteal and proximal calf veins. COMPARISON:  06/19/2019 FINDINGS: VENOUS Normal compressibility of the common femoral, superficial femoral, and popliteal veins, as well as the visualized calf veins. Visualized portions of profunda femoral vein and great saphenous vein unremarkable. No filling defects to suggest DVT on grayscale or color Doppler imaging. Doppler waveforms show normal direction of venous flow, normal respiratory plasticity and response to augmentation. Limited views of the contralateral common femoral vein are unremarkable. OTHER None. Limitations: none IMPRESSION: Negative examination for deep venous thrombosis in the left lower extremity. Electronically Signed   By: Eddie Candle M.D.   On: 05/08/2021 17:34      IMPRESSION/PLAN: Left breast cancer with  previous history of lumpectomy and radiation for left breast cancer in the 1980s  She was discussed at our tumor board today.  Given her age, early stage, and estrogen sensitivity the consensus is for her to undergo breast conserving surgery followed by antiestrogens.  I believe that the risks of repeat radiation to the breast outweigh the benefits and she has an excellent chance of local control with antiestrogens as her only adjuvant therapy.  HER2 testing will be repeated to verify if HER2 infusional therapy is warranted and medical oncology.  Genetic referral will also be made given her personal and family history.  I will see her back on an as-needed basis.  She is pleased with this plan.  On date of service, in total, I spent 35 minutes on this encounter. Patient was seen in person.   __________________________________________   Eppie Gibson, MD  This document serves as a record of services personally performed by Eppie Gibson, MD. It was created on her behalf by Roney Mans, a trained medical scribe. The creation of this record is based on the scribe's personal observations and the provider's statements to them. This document has been checked and approved by the attending provider.

## 2021-05-26 ENCOUNTER — Inpatient Hospital Stay: Payer: Medicare Other | Admitting: Licensed Clinical Social Worker

## 2021-05-26 ENCOUNTER — Inpatient Hospital Stay: Payer: Medicare Other

## 2021-05-26 ENCOUNTER — Encounter: Payer: Self-pay | Admitting: Genetic Counselor

## 2021-05-26 ENCOUNTER — Other Ambulatory Visit: Payer: Self-pay

## 2021-05-26 ENCOUNTER — Ambulatory Visit: Payer: BC Managed Care – PPO | Admitting: Physical Therapy

## 2021-05-26 ENCOUNTER — Inpatient Hospital Stay: Payer: Medicare Other | Attending: Oncology | Admitting: Oncology

## 2021-05-26 ENCOUNTER — Ambulatory Visit
Admission: RE | Admit: 2021-05-26 | Discharge: 2021-05-26 | Disposition: A | Discharge status: Home / Self Care | Payer: BC Managed Care – PPO | Source: Ambulatory Visit | Attending: Radiation Oncology | Admitting: Radiation Oncology

## 2021-05-26 ENCOUNTER — Encounter: Payer: Self-pay | Admitting: Radiation Oncology

## 2021-05-26 ENCOUNTER — Encounter: Payer: Self-pay | Admitting: *Deleted

## 2021-05-26 ENCOUNTER — Ambulatory Visit (HOSPITAL_BASED_OUTPATIENT_CLINIC_OR_DEPARTMENT_OTHER): Payer: BC Managed Care – PPO | Admitting: Genetic Counselor

## 2021-05-26 ENCOUNTER — Encounter: Payer: Self-pay | Admitting: Oncology

## 2021-05-26 ENCOUNTER — Other Ambulatory Visit: Payer: Self-pay | Admitting: General Surgery

## 2021-05-26 VITALS — BP 158/85 | HR 88 | Temp 97.2°F | Resp 20 | Ht 64.0 in | Wt 213.8 lb

## 2021-05-26 DIAGNOSIS — Z170 Estrogen receptor positive status [ER+]: Secondary | ICD-10-CM

## 2021-05-26 DIAGNOSIS — Z803 Family history of malignant neoplasm of breast: Secondary | ICD-10-CM | POA: Insufficient documentation

## 2021-05-26 DIAGNOSIS — C50212 Malignant neoplasm of upper-inner quadrant of left female breast: Secondary | ICD-10-CM | POA: Diagnosis not present

## 2021-05-26 DIAGNOSIS — Z90710 Acquired absence of both cervix and uterus: Secondary | ICD-10-CM | POA: Diagnosis not present

## 2021-05-26 DIAGNOSIS — Z808 Family history of malignant neoplasm of other organs or systems: Secondary | ICD-10-CM | POA: Diagnosis not present

## 2021-05-26 DIAGNOSIS — I10 Essential (primary) hypertension: Secondary | ICD-10-CM | POA: Diagnosis not present

## 2021-05-26 LAB — CBC WITH DIFFERENTIAL (CANCER CENTER ONLY)
Abs Immature Granulocytes: 0.01 10*3/uL (ref 0.00–0.07)
Basophils Absolute: 0 10*3/uL (ref 0.0–0.1)
Basophils Relative: 1 %
Eosinophils Absolute: 0.1 10*3/uL (ref 0.0–0.5)
Eosinophils Relative: 3 %
HCT: 37.1 % (ref 36.0–46.0)
Hemoglobin: 12.2 g/dL (ref 12.0–15.0)
Immature Granulocytes: 0 %
Lymphocytes Relative: 32 %
Lymphs Abs: 1.3 10*3/uL (ref 0.7–4.0)
MCH: 29.2 pg (ref 26.0–34.0)
MCHC: 32.9 g/dL (ref 30.0–36.0)
MCV: 88.8 fL (ref 80.0–100.0)
Monocytes Absolute: 0.5 10*3/uL (ref 0.1–1.0)
Monocytes Relative: 12 %
Neutro Abs: 2.2 10*3/uL (ref 1.7–7.7)
Neutrophils Relative %: 52 %
Platelet Count: 206 10*3/uL (ref 150–400)
RBC: 4.18 MIL/uL (ref 3.87–5.11)
RDW: 14.9 % (ref 11.5–15.5)
WBC Count: 4.2 10*3/uL (ref 4.0–10.5)
nRBC: 0 % (ref 0.0–0.2)

## 2021-05-26 LAB — CMP (CANCER CENTER ONLY)
ALT: 8 U/L (ref 0–44)
AST: 14 U/L — ABNORMAL LOW (ref 15–41)
Albumin: 3.6 g/dL (ref 3.5–5.0)
Alkaline Phosphatase: 66 U/L (ref 38–126)
Anion gap: 8 (ref 5–15)
BUN: 18 mg/dL (ref 8–23)
CO2: 26 mmol/L (ref 22–32)
Calcium: 9.4 mg/dL (ref 8.9–10.3)
Chloride: 105 mmol/L (ref 98–111)
Creatinine: 0.89 mg/dL (ref 0.44–1.00)
GFR, Estimated: >60 mL/min (ref >60)
Glucose, Bld: 110 mg/dL — ABNORMAL HIGH (ref 70–99)
Potassium: 3.9 mmol/L (ref 3.5–5.1)
Sodium: 139 mmol/L (ref 135–145)
Total Bilirubin: 0.4 mg/dL (ref 0.3–1.2)
Total Protein: 7.4 g/dL (ref 6.5–8.1)

## 2021-05-26 LAB — GENETIC SCREENING ORDER

## 2021-05-26 NOTE — Progress Notes (Signed)
[place]  [phone] [phone]     ID: [name] DOB: 1933-03-02  MR#: [id number]  QQP#:[id number]  Patient Care Team: Seward Carol, MD as PCP - General (Internal Medicine) Mauro Kaufmann, RN as Oncology Nurse Navigator Rockwell Germany, RN as Oncology Nurse Navigator Rolm Bookbinder, MD as Consulting Physician (General Surgery) Anona Giovannini, Virgie Dad, MD as Consulting Physician (Oncology) Eppie Gibson, MD as Attending Physician (Radiation Oncology) Garald Balding, MD as Consulting Physician (Orthopedic Surgery) Shon Hough, MD as Consulting Physician (Ophthalmology) Chauncey Cruel, MD OTHER MD:  CHIEF COMPLAINT: Triple positive breast cancer  CURRENT TREATMENT: Awaiting definitive surgery   HISTORY OF CURRENT ILLNESS: Jillian Buchanan has a history of prior left breast cancer. She underwent left lumpectomy with node dissection in 07/1986 and received radiation therapy followed by tamoxifen for 5 years.  More recently, she had routine screening mammography on 04/27/2021 showing a possible abnormality in the left breast. She underwent left diagnostic mammography with tomography and left breast ultrasonography at [facility] on 05/12/2021 showing: breast density category B; 0.8 cm lobulated mass in left breast at 9-10 o'clock; no significant left axilla abnormalities.  Accordingly on 05/18/2021 she proceeded to biopsy of the left breast area in question. The pathology from this procedure ([id number]) showed: invasive ductal carcinoma, grade 2; ductal carcinoma in situ. Prognostic indicators significant for: estrogen receptor, 100% positive and progesterone receptor, 2% positive, both with strong staining intensity. Proliferation marker Ki67 at 15%. HER2 equivocal by immunohistochemistry (2+), but positive by fluorescent in situ hybridization with a signals ratio 2 and number per cell 4.15.  Cancer Staging Malignant neoplasm of upper-inner  quadrant of left breast in female, estrogen receptor positive ([place]) Staging form: Breast, AJCC 8th Edition - Clinical stage from 05/26/2021: Stage IA (cT1b, cN0, cM0, G2, ER+, PR+, HER2+) - Signed by Chauncey Cruel, MD on 05/26/2021 Stage prefix: Initial diagnosis Histologic grading system: 3 grade system Laterality: Left Staged by: Pathologist and managing physician Stage used in treatment planning: Yes National guidelines used in treatment planning: Yes Type of national guideline used in treatment planning: NCCN  The patient's subsequent history is as detailed below.   INTERVAL HISTORY: Jillian Buchanan was evaluated in the multidisciplinary breast cancer clinic on 05/26/2021 accompanied by her husband Jillian Buchanan and their son Jillian Buchanan. Her case was also presented at the multidisciplinary breast cancer conference on the same day. At that time a preliminary plan was proposed: Lumpectomy with no sentinel lymph node sampling, genetics testing, no radiation given that she received it previously to the same breast, consideration of anti-HER2 treatment   REVIEW OF SYSTEMS: There were no specific symptoms leading to the original mammogram, which was routinely scheduled. On the provided questionnaire, Jillian Buchanan reports night sweats, wearing glasses, feet swelling, shortness of breath with climbing stairs, sleeping with two pillows, change in stool habits, incontinence, skin rash, easy bruising, back and joint pain with arthritis that causes difficulty walking, anxiety, thyroid problem, and history of blood transfusion in 1987. The patient denies unusual headaches, visual changes, nausea, vomiting, stiff neck, dizziness, or gait imbalance. There has been no cough, phlegm production, or pleurisy, no chest pain or pressure, and no change in bowel or bladder habits. The patient denies fever, rash, bleeding, unexplained fatigue or unexplained weight loss. A detailed review of systems was otherwise entirely negative.   COVID 19  VACCINATION STATUS: Status post [employer] x3 as of July 2022   PAST MEDICAL HISTORY: Past Medical History:  Diagnosis Date  Anxiety    self reported   Breast cancer ([place])    Cataract    Family history of bone cancer    Family history of breast cancer    Hypertension     PAST SURGICAL HISTORY: Past Surgical History:  Procedure Laterality Date   ABDOMINAL HYSTERECTOMY     BREAST LUMPECTOMY  1988   INTRAOCULAR PROSTHESES INSERTION  8/248/2005    FAMILY HISTORY: Family History  Problem Relation Age of Onset   Breast cancer Maternal Aunt        x5 aunts, dx >50   Breast cancer Paternal Aunt        dx >50   Breast cancer Cousin        x4 maternal first cousins   Bone cancer Daughter 79   Her father died at age 94 from CVA. Her mother died at age 6. [name] had two brothers. She reports breast cancer in five maternal aunts and one paternal aunt. Aside from those aunts, there is no additional family history of cancer to her knowledge.   GYNECOLOGIC HISTORY:  No LMP recorded. Patient has had a hysterectomy. Menarche: 87 years old Age at first live birth: 87 years old [place] P 2 LMP 1977, with hysterectomy Contraceptive: used for 5 years without issue HRT used for 5 years  Hysterectomy? Yes, 1977 BSO?  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 Husband Jillian Buchanan is retired from working as a Environmental consultant for [employer]. Son Jillian Buchanan "Jillian Buchanan," age 40, is a Corporate investment banker here in [place]. Daughter Jillian Buchanan died at age 62 on 2016/04/23 shortly after being diagnosed with cancer.    ADVANCED DIRECTIVES: in place   HEALTH MAINTENANCE: Social History   Tobacco Use   Smoking status: Never   Smokeless tobacco: Never  Vaping Use   Vaping Use: Never used  Substance Use Topics   Alcohol use: No   Drug use: No     Colonoscopy: never done  PAP: 2014  Bone density:  03/2021   No Known Allergies  Current Outpatient Medications  Medication Sig Dispense Refill   ALPRAZolam (XANAX) 0.5 MG tablet TAKE 1 TABLET BY MOUTH EVERY DAY AS NEEDED FOR ANXIETY  3   amLODipine (NORVASC) 5 MG tablet Take 5 mg by mouth daily.       aspirin 81 MG EC tablet TAKE ONE (1) TABLET EACH DAY  3   bumetanide (BUMEX) 0.5 MG tablet Take 0.5 mg by mouth daily.       hydroquinone 4 % cream AS DIRECTED TO AFFECTED AREA TWICE A DAY AS NEEDED EXTERNALLY     levothyroxine (SYNTHROID, LEVOTHROID) 88 MCG tablet      losartan (COZAAR) 100 MG tablet      rosuvastatin (CRESTOR) 5 MG tablet Take 5 mg by mouth daily.     triamcinolone cream (KENALOG) 0.5 % APPLY TO AFFECTED AREA OF LEG TWICE A DAY AS NEEDED  3   acetaminophen (TYLENOL) 325 MG tablet  (Patient not taking: Reported on 05/26/2021)     diclofenac sodium (VOLTAREN) 1 % GEL Apply 2 g topically 4 (four) times daily. 3 Tube 2   naproxen sodium (ALEVE) 220 MG tablet      No current facility-administered medications for this visit.    OBJECTIVE: African-American woman who appears younger than stated age  62:   05/26/21 0910  BP: (!) 158/85  Pulse: 88  Resp:  20  Temp: (!) 97.2 F (36.2 C)  SpO2: 99%     Body mass index is 36.7 kg/m.   Wt Readings from Last 3 Encounters:  05/26/21 213 lb 12.8 oz (97 kg)  05/08/21 217 lb (98.4 kg)  04/23/20 230 lb (104.3 kg)      ECOG FS:1 - Symptomatic but completely ambulatory  Ocular: Sclerae unicteric, pupils round and equal Ear-nose-throat: Wearing a mask Lymphatic: No cervical or supraclavicular adenopathy Lungs no rales or rhonchi Heart regular rate and rhythm Abd soft, nontender, positive bowel sounds MSK mild kyphosis but no focal spinal tenderness, no joint edema Neuro: non-focal, well-oriented, appropriate affect Breasts: The right breast is benign.  The left breast is status post recent biopsy.  I do not see an ecchymosis.  Do not palpate a well-defined mass.  Both  axillae are benign.   LAB RESULTS:  CMP     Component Value Date/Time   NA 139 05/26/2021 0854   K 3.9 05/26/2021 0854   CL 105 05/26/2021 0854   CO2 26 05/26/2021 0854   GLUCOSE 110 (H) 05/26/2021 0854   BUN 18 05/26/2021 0854   CREATININE 0.89 05/26/2021 0854   CALCIUM 9.4 05/26/2021 0854   PROT 7.4 05/26/2021 0854   ALBUMIN 3.6 05/26/2021 0854   AST 14 (L) 05/26/2021 0854   ALT 8 05/26/2021 0854   ALKPHOS 66 05/26/2021 0854   BILITOT 0.4 05/26/2021 0854   GFRNONAA >60 05/26/2021 0854    No results found for: TOTALPROTELP, ALBUMINELP, A1GS, A2GS, BETS, BETA2SER, GAMS, MSPIKE, SPEI  Lab Results  Component Value Date   WBC 4.2 05/26/2021   NEUTROABS 2.2 05/26/2021   HGB 12.2 05/26/2021   HCT 37.1 05/26/2021   MCV 88.8 05/26/2021   PLT 206 05/26/2021    No results found for: LABCA2  No components found for: NOMVEH209  No results for input(s): INR in the last 168 hours.  No results found for: LABCA2  No results found for: OBS962  No results found for: EZM629  No results found for: UTM546  No results found for: CA2729  No components found for: HGQUANT  No results found for: CEA1 / No results found for: CEA1   No results found for: AFPTUMOR  No results found for: CHROMOGRNA  No results found for: KPAFRELGTCHN, LAMBDASER, KAPLAMBRATIO (kappa/lambda light chains)  No results found for: HGBA, HGBA2QUANT, HGBFQUANT, HGBSQUAN (Hemoglobinopathy evaluation)   Lab Results  Component Value Date   LDH 167 09/13/2010    No results found for: IRON, TIBC, IRONPCTSAT (Iron and TIBC)  No results found for: FERRITIN  Urinalysis No results found for: COLORURINE, APPEARANCEUR, LABSPEC, PHURINE, GLUCOSEU, HGBUR, BILIRUBINUR, KETONESUR, PROTEINUR, UROBILINOGEN, NITRITE, LEUKOCYTESUR   STUDIES: US Venous Img Lower  Left (DVT Study)  Result Date: 05/08/2021 CLINICAL DATA:  Pain EXAM: LEFT LOWER EXTREMITY VENOUS DOPPLER ULTRASOUND TECHNIQUE: Gray-scale  sonography with compression, as well as color and duplex ultrasound, were performed to evaluate the deep venous system(s) from the level of the common femoral vein through the popliteal and proximal calf veins. COMPARISON:  06/19/2019 FINDINGS: VENOUS Normal compressibility of the common femoral, superficial femoral, and popliteal veins, as well as the visualized calf veins. Visualized portions of profunda femoral vein and great saphenous vein unremarkable. No filling defects to suggest DVT on grayscale or color Doppler imaging. Doppler waveforms show normal direction of venous flow, normal respiratory plasticity and response to augmentation. Limited views of the contralateral common femoral vein are unremarkable. OTHER None. Limitations: none IMPRESSION:  Negative examination for deep venous thrombosis in the left lower extremity. Electronically Signed   By: Eddie Candle M.D.   On: 05/08/2021 17:34     ELIGIBLE FOR AVAILABLE RESEARCH PROTOCOL: no  ASSESSMENT: 87 y.o. [place] woman status post left breast upper inner quadrant biopsy 05/18/2021 for a clinical T1b N0, stage IA invasive ductal carcinoma, triple positive, with an MIB-1 of 15%.  (1) genetics testing  (2) definitive surgery pending  (3) trastuzumab (high elect) every 3 weeks for 6 months  (4) anastrozole to be started at the same time as trastuzumab  PLAN: I met today with Jillian Buchanan to review her new diagnosis. Specifically we discussed the biology of her breast cancer, its diagnosis, staging, treatment  options and prognosis. We first reviewed the fact that cancer is not one disease but more than 100 different diseases and that it is important to keep them separate-- otherwise when friends and relatives discuss their own cancer experiences with [place] confusion can result. Similarly we explained that if breast cancer spreads to the bone or liver, the patient would not have bone cancer or liver cancer, but breast cancer in the bone and breast  cancer in the liver: one cancer in three places-- not 3 different cancers which otherwise would have to be treated in 3 different ways.  We discussed the difference between local and systemic therapy. In terms of loco-regional treatment, lumpectomy plus radiation is equivalent to mastectomy as far as survival is concerned. For this reason, and because the cosmetic results are generally superior, we recommend breast conserving surgery.   We then discussed the rationale for systemic therapy. There is some risk that this cancer may have already spread to other parts of her body. Patients frequently ask at this point about bone scans, CAT scans and PET scans to find out if they have occult breast cancer somewhere else. The problem is that in early stage disease we are much more likely to find false positives then true cancers and this would expose the patient to unnecessary procedures as well as unnecessary radiation. Scans cannot answer the question the patient really would like to know, which is whether she has microscopic disease elsewhere in her body. For those reasons we do not recommend them.  Of course we would proceed to aggressive evaluation of any symptoms that might suggest metastatic disease, but that is not the case here.  Next we went over the options for systemic therapy which are anti-estrogens, anti-HER-2 immunotherapy, and chemotherapy. Jillian Buchanan meets criteria for anti-HER-2 immunotherapy.  More specifically she is a good candidate for anti-estrogens.  She is not a good candidate for chemotherapy given her age and comorbidities and chemotherapy is not planned in this case.  We are considering anti-HER2 treatment partly because HER2 positive tumors are more aggressive and have a higher tendency to metastasize even in very early cases like this 1 and partly because crosstalk between the HER2 receptor and the estrogen receptor makes antiestrogens less effective in cases that have not received  anti-HER2 treatment.  We did discuss the question regarding heart muscle weakening from anti-HER2 treatments.  If her initial echo is in the normal range we will start anti-HER2 treatment adjuvantly and repeat echocardiography every 2 months until she completes 6 months of treatment.  Since she will not be receiving chemo and will not have a port we will give this subcutaneously  Jillian Buchanan does meet criteria for genetics testing. In patients who carry a deleterious mutation [for example in a  BRCA gene], the risk of a new breast cancer developing in the future may be sufficiently great that the patient may choose bilateral mastectomies. However if she wishes to keep her breasts in that situation it is safe to do so. That would require intensified screening, which generally means not only yearly mammography but a yearly breast MRI as well.   [place] has a good understanding of the overall plan. She agrees with it. She knows the goal of treatment in her case is cure. She will call with any problems that may develop before her next visit here.  Total encounter time 65 minutes.Jillian Buchanan Jews C. Mischelle Reeg, MD 05/26/2021 5:38 PM Medical Oncology and Hematology [facility] [place], [place] [address] Tel. [phone]    Fax. [phone]   This document serves as a record of services personally performed by Lurline Del, MD. It was created on his behalf by Wilburn Mylar, a trained medical scribe. The creation of this record is based on the scribe's personal observations and the provider's statements to them.   I, Lurline Del MD, have reviewed the above documentation for accuracy and completeness, and I agree with the above.    *Total Encounter Time as defined by the Centers for Medicare and Medicaid Services includes, in addition to the face-to-face time of a patient visit (documented in the note above) non-face-to-face time: obtaining and reviewing outside history,  ordering and reviewing medications, tests or procedures, care coordination (communications with other health care professionals or caregivers) and documentation in the medical record.

## 2021-05-26 NOTE — Progress Notes (Signed)
[place] Clinical Social Work  Initial Assessment   Jillian Buchanan is a 87 y.o. year old female accompanied by son and husband. Clinical Social Work was referred by [facility] for assessment of psychosocial needs.   SDOH (Social Determinants of Health) assessments performed: Yes SDOH Interventions    Flowsheet Row Most Recent Value  SDOH Interventions   Food Insecurity Interventions Intervention Not Indicated  Housing Interventions Intervention Not Indicated  Transportation Interventions Intervention Not Indicated       Distress Screen completed: Yes ONCBCN DISTRESS SCREENING 05/26/2021  Screening Type Initial Screening  Distress experienced in past week (1-10) 10  Emotional problem type Nervousness/Anxiety;Adjusting to illness;Feeling hopeless  Spiritual/Religous concerns type Facing my mortality  Information Concerns Type Lack of info about diagnosis;Lack of info about treatment      Family/Social Information:  Housing Arrangement: patient lives with husband Family members/support persons in your life? Family Transportation concerns: no  Employment: Retired. Income source: Paediatric nurse concerns: No Type of concern: None Food access concerns: no Services Currently in place:  n/a  Coping/ Adjustment to diagnosis: Patient understands treatment plan and what happens next? yes Concerns about diagnosis and/or treatment:  General adjustment to second cancer dx (1st in 1987) Patient reported stressors: Adjusting to my illness and Facing my mortality Current coping skills/ strengths: Capable of independent living and Supportive family/friends    SUMMARY: Current SDOH Barriers:  No significant SDOH barriers noted today  Clinical Social Work Clinical Goal(s):  No clinical SW goals at this time  Interventions: Discussed common feeling and emotions when being diagnosed with cancer, and the importance of support during treatment Informed patient of the support  team roles and support services at [facility] Provided CSW contact information and encouraged patient to call with any questions or concerns   Follow Up Plan: Patient will contact CSW with any support or resource needs Patient verbalizes understanding of plan: Yes    Christeen Douglas LCSW

## 2021-05-26 NOTE — Progress Notes (Signed)
REFERRING PROVIDER: Chauncey Cruel, MD [address] [place],  [place] [address]  PRIMARY PROVIDER:  Seward Carol, MD  PRIMARY REASON FOR VISIT:  1. Malignant neoplasm of upper-inner quadrant of left breast in female, estrogen receptor positive ([place])   2. Family history of breast cancer   3. Family history of bone cancer      HISTORY OF PRESENT ILLNESS:   Ms. Credeur, a 87 y.o. female, was seen for a [place] cancer genetics consultation at the request of Dr. Jana Hakim due to a personal and family history of cancer.  Ms. Tawney presents to clinic today to discuss the possibility of a hereditary predisposition to cancer, genetic testing, and to further clarify her future cancer risks, as well as potential cancer risks for family members.   Ms. Jasmer has a history of left breast cancer diagnosed in 87 at age 12. This cancer was treated with left lumpectomy and radiation therapy.  More recently in July of 2022, at the age of 44, Ms. Vallely was diagnosed with invasive ductal carcinoma with ductal carcinoma in situ of the left breast. The tumor is ER+/PR+/Her2+. The treatment plan includes lumpectomy and antiestrogen therapy   CANCER HISTORY:  Oncology History  Malignant neoplasm of upper-inner quadrant of left breast in female, estrogen receptor positive ([place])  05/24/2021 Initial Diagnosis   Malignant neoplasm of upper-inner quadrant of left breast in female, estrogen receptor positive ([place])    06/08/2021 -  Chemotherapy      Patient is on Antibody Plan: BREAST TRASTUZUMAB Q21D        RISK FACTORS:  Menarche was at age 70.  First live birth at age 50.  OCP use for approximately 5 years.  Ovaries intact: no.  Hysterectomy: yes.  Menopausal status: postmenopausal.  HRT use: 5 years. Colonoscopy: no; not examined. Mammogram within the last year: yes.   Past Medical History:  Diagnosis Date   Anxiety    self reported   Breast cancer ([place])    Cataract    Family  history of bone cancer    Family history of breast cancer    Hypertension     Past Surgical History:  Procedure Laterality Date   ABDOMINAL HYSTERECTOMY     BREAST LUMPECTOMY  1988   INTRAOCULAR PROSTHESES INSERTION  8/248/2005    Social History   Socioeconomic History   Marital status: Married    Spouse name: Not on file   Number of children: Not on file   Years of education: Not on file   Highest education level: Not on file  Occupational History   Not on file  Tobacco Use   Smoking status: Never   Smokeless tobacco: Never  Vaping Use   Vaping Use: Never used  Substance and Sexual Activity   Alcohol use: No   Drug use: No   Sexual activity: Never  Other Topics Concern   Not on file  Social History Narrative   Not on file   Social Determinants of Health   Financial Resource Strain: Not on file  Food Insecurity: No Food Insecurity   Worried About Running Out of Food in the Last Year: Never true   [place] in the Last Year: Never true  Transportation Needs: No Transportation Needs   Lack of Transportation (Medical): No   Lack of Transportation (Non-Medical): No  Physical Activity: Not on file  Stress: Not on file  Social Connections: Not on file     FAMILY HISTORY:  We obtained a detailed, 4-generation family history.  Significant diagnoses are listed below: Family History  Problem Relation Age of Onset   Breast cancer Maternal Aunt        x5 aunts, dx >50   Breast cancer Paternal Aunt        dx >50   Breast cancer Cousin        x4 maternal first cousins   Bone cancer Daughter 54   Ms. Lenderman has one son (age 72) and had one daughter who died at age 54 from bone cancer. She had two brothers, neither of whom had cancer.  Ms. Guyton mother died at age 21 without cancer. There were six maternal aunts and three maternal uncles. Five maternal aunts had breast cancer (all diagnosed older than 48). Four maternal cousins had breast cancer, some diagnosed  older than 70 and some diagnosed younger than 68. Ms. Wawrzyniak maternal grandmother died in her 34s or 58s without cancer. Her maternal grandfather died in his 1s or 35s without cancer.   Ms. Demarco father died at age 85 without cancer. There were four paternal aunts and two paternal uncles. One paternal aunt had breast cancer older than 39. There is no known cancer among paternal cousins. Ms. Bazar paternal grandmother died younger than 93 without cancer. Her paternal grandfather died older than 6 without cancer.  Ms. Garlitz is unaware of previous family history of genetic testing for hereditary cancer risks. Patient's ancestors are of Black/African American descent. There is no reported Ashkenazi Jewish ancestry. There is no known consanguinity.  GENETIC COUNSELING ASSESSMENT: Ms. Polo is a 87 y.o. female with a personal and family history of cancer which is somewhat suggestive of a hereditary cancer syndrome and predisposition to cancer. We, therefore, discussed and recommended the following at today's visit.   DISCUSSION: We discussed that approximately 5-10% of breast cancer is hereditary, with most cases associated with the BRCA1 and BRCA2 genes. There are other genes that can be associated with hereditary breast cancer syndromes. These include ATM, CHEK2, PALB2, etc. We discussed that testing is beneficial for several reasons, including knowing about other cancer risks, identifying potential screening and risk-reduction options that may be appropriate, and to understand if other family members could be at risk for cancer and allow them to undergo genetic testing.  We reviewed the characteristics, features and inheritance patterns of hereditary cancer syndromes. We also discussed genetic testing, including the appropriate family members to test, the process of testing, insurance coverage and turn-around-time for results. We discussed the implications of a negative, positive and/or variant of uncertain  significant result. We recommended Ms. Gertsch pursue genetic testing for the [facility] CancerNext-Expanded + RNAinsight panel.   The CancerNext-Expanded + RNAinsight gene panel offered by [employer] and includes sequencing and rearrangement analysis for the following 77 genes: AIP, ALK, APC, ATM, AXIN2, BAP1, BARD1, BLM, BMPR1A, BRCA1, BRCA2, BRIP1, CDC73, CDH1, CDK4, CDKN1B, CDKN2A, CHEK2, CTNNA1, DICER1, FANCC, FH, FLCN, GALNT12, KIF1B, LZTR1, MAX, MEN1, MET, MLH1, MSH2, MSH3, MSH6, MUTYH, NBN, NF1, NF2, NTHL1, PALB2, PHOX2B, PMS2, POT1, PRKAR1A, PTCH1, PTEN, RAD51C, RAD51D, RB1, RECQL, RET, SDHA, SDHAF2, SDHB, SDHC, SDHD, SMAD4, SMARCA4, SMARCB1, SMARCE1, STK11, SUFU, TMEM127, TP53, TSC1, TSC2, VHL and XRCC2 (sequencing and deletion/duplication); EGFR, EGLN1, HOXB13, KIT, MITF, PDGFRA, POLD1 and POLE (sequencing only); EPCAM and GREM1 (deletion/duplication only). RNA data is routinely analyzed for use in variant interpretation for all genes.  Based on Ms. Karpf's personal and family history of cancer, she meets medical criteria for genetic  testing. Despite that she meets criteria, there may still be an out of pocket cost.   PLAN: After considering the risks, benefits, and limitations, Ms. Mchaney provided informed consent to pursue genetic testing and the blood sample was sent to [facility] for analysis of the CancerNext-Expanded + RNAinsight panel. Results should be available within approximately two-three weeks' time, at which point they will be disclosed by telephone to Ms. Jersey, as will any additional recommendations warranted by these results. Ms. Berwanger will receive a summary of her genetic counseling visit and a copy of her results once available. This information will also be available in Epic.   Ms. Wyss questions were answered to her satisfaction today. Our contact information was provided should additional questions or concerns arise. Thank you for the referral and allowing [place] to share in the  care of your patient.   Clint Guy, [place], [facility] Licensed, Certified Dispensing [email or web address] Phone: [phone]  The patient was seen for a total of 20 minutes in face-to-face genetic counseling. Patient was seen with her husband, Philbert, and her son, Fraser Din. This patient was discussed with Drs. Magrinat, Lindi Adie and/or Burr Medico who agrees with the above.    _______________________________________________________________________ For Office Staff:  Number of people involved in session: 1 Was an Intern/ student involved with case: no

## 2021-05-27 ENCOUNTER — Encounter: Payer: Self-pay | Admitting: Oncology

## 2021-06-01 ENCOUNTER — Encounter: Payer: Self-pay | Admitting: *Deleted

## 2021-06-01 ENCOUNTER — Telehealth: Payer: Self-pay | Admitting: *Deleted

## 2021-06-01 NOTE — Telephone Encounter (Signed)
Late note from 8/1- Left message for a return phone call regarding questions about her echo and surgery.    8/2 - Called and spoke with patient to follow up from [facility] and assess navigation needs and answer any questions she may have.  Patient was confused when she received a call to try to schedule her sx at an earlier date for 8/3.  She thought she needed the echo prior to sx. I explained that she did not need echo prior to sx and that Dr. Donne Hazel had an opening and they were offering a sooner date.  Patient verbalized understanding. She is fine with having her sx 8/16 as scheduled.  Encouraged her to call should any other questions or concern arise.  Patient verbalize understanding.

## 2021-06-07 ENCOUNTER — Ambulatory Visit (HOSPITAL_COMMUNITY)
Admission: RE | Admit: 2021-06-07 | Discharge: 2021-06-07 | Disposition: A | Discharge status: Home / Self Care | Payer: Medicare Other | Source: Ambulatory Visit | Attending: Oncology | Admitting: Oncology

## 2021-06-07 ENCOUNTER — Inpatient Hospital Stay: Payer: BC Managed Care – PPO | Attending: Oncology

## 2021-06-07 ENCOUNTER — Other Ambulatory Visit: Payer: Self-pay

## 2021-06-07 DIAGNOSIS — C50212 Malignant neoplasm of upper-inner quadrant of left female breast: Secondary | ICD-10-CM

## 2021-06-07 DIAGNOSIS — Z0189 Encounter for other specified special examinations: Secondary | ICD-10-CM

## 2021-06-07 DIAGNOSIS — I10 Essential (primary) hypertension: Secondary | ICD-10-CM | POA: Insufficient documentation

## 2021-06-07 DIAGNOSIS — Z01818 Encounter for other preprocedural examination: Secondary | ICD-10-CM | POA: Insufficient documentation

## 2021-06-07 DIAGNOSIS — Z170 Estrogen receptor positive status [ER+]: Secondary | ICD-10-CM

## 2021-06-07 LAB — ECHOCARDIOGRAM COMPLETE
Area-P 1/2: 3.72 cm2
S' Lateral: 3.1 cm

## 2021-06-07 NOTE — Progress Notes (Signed)
  Echocardiogram 2D Echocardiogram has been performed.  Jillian Buchanan 06/07/2021, 10:12 AM

## 2021-06-08 ENCOUNTER — Encounter (HOSPITAL_BASED_OUTPATIENT_CLINIC_OR_DEPARTMENT_OTHER): Payer: Self-pay | Admitting: General Surgery

## 2021-06-08 ENCOUNTER — Other Ambulatory Visit: Payer: Self-pay

## 2021-06-09 ENCOUNTER — Telehealth: Payer: Self-pay

## 2021-06-09 NOTE — Telephone Encounter (Signed)
PA required for Synvisc, bilateral knee. Completed PA online through Covermymeds. Pending [id number]

## 2021-06-10 ENCOUNTER — Telehealth: Payer: Self-pay

## 2021-06-10 ENCOUNTER — Encounter (HOSPITAL_BASED_OUTPATIENT_CLINIC_OR_DEPARTMENT_OTHER)
Admission: RE | Admit: 2021-06-10 | Discharge: 2021-06-10 | Disposition: A | Discharge status: Home / Self Care | Payer: BC Managed Care – PPO | Source: Ambulatory Visit | Attending: General Surgery | Admitting: General Surgery

## 2021-06-10 DIAGNOSIS — Z170 Estrogen receptor positive status [ER+]: Secondary | ICD-10-CM | POA: Diagnosis not present

## 2021-06-10 DIAGNOSIS — Z803 Family history of malignant neoplasm of breast: Secondary | ICD-10-CM | POA: Diagnosis not present

## 2021-06-10 DIAGNOSIS — I10 Essential (primary) hypertension: Secondary | ICD-10-CM | POA: Diagnosis not present

## 2021-06-10 DIAGNOSIS — Z791 Long term (current) use of non-steroidal anti-inflammatories (NSAID): Secondary | ICD-10-CM | POA: Diagnosis not present

## 2021-06-10 DIAGNOSIS — E119 Type 2 diabetes mellitus without complications: Secondary | ICD-10-CM | POA: Diagnosis not present

## 2021-06-10 DIAGNOSIS — Z7982 Long term (current) use of aspirin: Secondary | ICD-10-CM | POA: Diagnosis not present

## 2021-06-10 DIAGNOSIS — Z853 Personal history of malignant neoplasm of breast: Secondary | ICD-10-CM | POA: Diagnosis not present

## 2021-06-10 DIAGNOSIS — Z79890 Hormone replacement therapy (postmenopausal): Secondary | ICD-10-CM | POA: Diagnosis not present

## 2021-06-10 DIAGNOSIS — C50212 Malignant neoplasm of upper-inner quadrant of left female breast: Secondary | ICD-10-CM | POA: Diagnosis not present

## 2021-06-10 DIAGNOSIS — Z79899 Other long term (current) drug therapy: Secondary | ICD-10-CM | POA: Diagnosis not present

## 2021-06-10 MED ORDER — ENSURE PRE-SURGERY PO LIQD: 296.0000 mL | Freq: Once | ORAL | Status: DC

## 2021-06-10 NOTE — Progress Notes (Signed)

## 2021-06-10 NOTE — Telephone Encounter (Signed)
Submitted for Eufleexa, bilateral knee due to Synvisc not being a preferred product through patient's insurance.

## 2021-06-14 DIAGNOSIS — C50212 Malignant neoplasm of upper-inner quadrant of left female breast: Secondary | ICD-10-CM | POA: Diagnosis not present

## 2021-06-15 ENCOUNTER — Encounter (HOSPITAL_BASED_OUTPATIENT_CLINIC_OR_DEPARTMENT_OTHER): Payer: Self-pay | Admitting: General Surgery

## 2021-06-15 ENCOUNTER — Other Ambulatory Visit: Payer: Self-pay

## 2021-06-15 ENCOUNTER — Telehealth: Payer: Self-pay

## 2021-06-15 ENCOUNTER — Ambulatory Visit (HOSPITAL_BASED_OUTPATIENT_CLINIC_OR_DEPARTMENT_OTHER): Payer: Medicare Other | Admitting: Certified Registered"

## 2021-06-15 ENCOUNTER — Ambulatory Visit (HOSPITAL_BASED_OUTPATIENT_CLINIC_OR_DEPARTMENT_OTHER)
Admission: RE | Admit: 2021-06-15 | Discharge: 2021-06-15 | Disposition: A | Discharge status: Home / Self Care | Payer: Medicare Other | Attending: General Surgery | Admitting: General Surgery

## 2021-06-15 ENCOUNTER — Encounter (HOSPITAL_BASED_OUTPATIENT_CLINIC_OR_DEPARTMENT_OTHER)
Admission: RE | Disposition: A | Discharge status: Home / Self Care | Payer: Self-pay | Source: Home / Self Care | Attending: General Surgery

## 2021-06-15 DIAGNOSIS — C50212 Malignant neoplasm of upper-inner quadrant of left female breast: Secondary | ICD-10-CM | POA: Diagnosis not present

## 2021-06-15 DIAGNOSIS — I10 Essential (primary) hypertension: Secondary | ICD-10-CM | POA: Insufficient documentation

## 2021-06-15 DIAGNOSIS — Z7982 Long term (current) use of aspirin: Secondary | ICD-10-CM | POA: Diagnosis not present

## 2021-06-15 DIAGNOSIS — Z791 Long term (current) use of non-steroidal anti-inflammatories (NSAID): Secondary | ICD-10-CM | POA: Insufficient documentation

## 2021-06-15 DIAGNOSIS — E119 Type 2 diabetes mellitus without complications: Secondary | ICD-10-CM | POA: Insufficient documentation

## 2021-06-15 DIAGNOSIS — Z79899 Other long term (current) drug therapy: Secondary | ICD-10-CM | POA: Insufficient documentation

## 2021-06-15 DIAGNOSIS — Z170 Estrogen receptor positive status [ER+]: Secondary | ICD-10-CM | POA: Diagnosis not present

## 2021-06-15 DIAGNOSIS — C50912 Malignant neoplasm of unspecified site of left female breast: Secondary | ICD-10-CM | POA: Diagnosis not present

## 2021-06-15 DIAGNOSIS — D0512 Intraductal carcinoma in situ of left breast: Secondary | ICD-10-CM | POA: Diagnosis not present

## 2021-06-15 DIAGNOSIS — Z803 Family history of malignant neoplasm of breast: Secondary | ICD-10-CM | POA: Insufficient documentation

## 2021-06-15 DIAGNOSIS — Z853 Personal history of malignant neoplasm of breast: Secondary | ICD-10-CM | POA: Diagnosis not present

## 2021-06-15 DIAGNOSIS — Z79890 Hormone replacement therapy (postmenopausal): Secondary | ICD-10-CM | POA: Insufficient documentation

## 2021-06-15 HISTORY — PX: BREAST LUMPECTOMY WITH RADIOACTIVE SEED LOCALIZATION: SHX6424

## 2021-06-15 SURGERY — BREAST LUMPECTOMY WITH RADIOACTIVE SEED LOCALIZATION
Anesthesia: General | Site: Breast | Laterality: Left

## 2021-06-15 MED ORDER — CHLORHEXIDINE GLUCONATE CLOTH 2 % EX PADS: 6.0000 | MEDICATED_PAD | Freq: Once | CUTANEOUS | Status: DC

## 2021-06-15 MED ORDER — ACETAMINOPHEN 500 MG PO TABS
ORAL_TABLET | ORAL | Status: AC
  Filled 2021-06-15: qty 2

## 2021-06-15 MED ORDER — ONDANSETRON HCL 4 MG/2ML IJ SOLN
INTRAMUSCULAR | Status: DC | PRN
  Administered 2021-06-15: 4 mg via INTRAVENOUS

## 2021-06-15 MED ORDER — EPHEDRINE 5 MG/ML INJ
INTRAVENOUS | Status: AC
  Filled 2021-06-15: qty 10

## 2021-06-15 MED ORDER — HYDROMORPHONE HCL 1 MG/ML IJ SOLN: 0.2500 mg | INTRAMUSCULAR | Status: DC | PRN

## 2021-06-15 MED ORDER — PHENYLEPHRINE 40 MCG/ML (10ML) SYRINGE FOR IV PUSH (FOR BLOOD PRESSURE SUPPORT)
PREFILLED_SYRINGE | INTRAVENOUS | Status: AC
  Filled 2021-06-15: qty 10

## 2021-06-15 MED ORDER — PROPOFOL 10 MG/ML IV BOLUS
INTRAVENOUS | Status: AC
  Filled 2021-06-15: qty 20

## 2021-06-15 MED ORDER — AMISULPRIDE (ANTIEMETIC) 5 MG/2ML IV SOLN: 10.0000 mg | Freq: Once | INTRAVENOUS | Status: DC | PRN

## 2021-06-15 MED ORDER — PROMETHAZINE HCL 25 MG/ML IJ SOLN: 6.2500 mg | INTRAMUSCULAR | Status: DC | PRN

## 2021-06-15 MED ORDER — EPHEDRINE SULFATE 50 MG/ML IJ SOLN
INTRAMUSCULAR | Status: DC | PRN
  Administered 2021-06-15 (×5): 10 mg via INTRAVENOUS

## 2021-06-15 MED ORDER — CEFAZOLIN SODIUM-DEXTROSE 2-4 GM/100ML-% IV SOLN
2.0000 g | INTRAVENOUS | Status: AC
  Administered 2021-06-15: 2 g via INTRAVENOUS

## 2021-06-15 MED ORDER — FENTANYL CITRATE (PF) 100 MCG/2ML IJ SOLN
INTRAMUSCULAR | Status: AC
  Filled 2021-06-15: qty 2

## 2021-06-15 MED ORDER — PHENYLEPHRINE HCL (PRESSORS) 10 MG/ML IV SOLN
INTRAVENOUS | Status: DC | PRN
  Administered 2021-06-15 (×3): 40 ug via INTRAVENOUS

## 2021-06-15 MED ORDER — PROPOFOL 10 MG/ML IV BOLUS
INTRAVENOUS | Status: DC | PRN
  Administered 2021-06-15: 150 mg via INTRAVENOUS

## 2021-06-15 MED ORDER — OXYCODONE HCL 5 MG/5ML PO SOLN: 5.0000 mg | Freq: Once | ORAL | Status: DC | PRN

## 2021-06-15 MED ORDER — OXYCODONE HCL 5 MG PO TABS: 5.0000 mg | ORAL_TABLET | Freq: Once | ORAL | Status: DC | PRN

## 2021-06-15 MED ORDER — ACETAMINOPHEN 500 MG PO TABS
1000.0000 mg | ORAL_TABLET | ORAL | Status: AC
  Administered 2021-06-15: 1000 mg via ORAL

## 2021-06-15 MED ORDER — DEXAMETHASONE SODIUM PHOSPHATE 10 MG/ML IJ SOLN
INTRAMUSCULAR | Status: AC
  Filled 2021-06-15: qty 1

## 2021-06-15 MED ORDER — ONDANSETRON HCL 4 MG/2ML IJ SOLN
INTRAMUSCULAR | Status: AC
  Filled 2021-06-15: qty 2

## 2021-06-15 MED ORDER — CEFAZOLIN SODIUM-DEXTROSE 2-4 GM/100ML-% IV SOLN
INTRAVENOUS | Status: AC
  Filled 2021-06-15: qty 100

## 2021-06-15 MED ORDER — DEXAMETHASONE SODIUM PHOSPHATE 10 MG/ML IJ SOLN
INTRAMUSCULAR | Status: DC | PRN
  Administered 2021-06-15: 5 mg via INTRAVENOUS

## 2021-06-15 MED ORDER — LACTATED RINGERS IV SOLN: INTRAVENOUS | Status: DC

## 2021-06-15 MED ORDER — LIDOCAINE HCL (PF) 2 % IJ SOLN
INTRAMUSCULAR | Status: AC
  Filled 2021-06-15: qty 5

## 2021-06-15 MED ORDER — BUPIVACAINE HCL (PF) 0.25 % IJ SOLN
INTRAMUSCULAR | Status: DC | PRN
  Administered 2021-06-15: 10 mL

## 2021-06-15 MED ORDER — FENTANYL CITRATE (PF) 100 MCG/2ML IJ SOLN
INTRAMUSCULAR | Status: DC | PRN
  Administered 2021-06-15 (×2): 25 ug via INTRAVENOUS

## 2021-06-15 MED ORDER — LIDOCAINE HCL (CARDIAC) PF 100 MG/5ML IV SOSY
PREFILLED_SYRINGE | INTRAVENOUS | Status: DC | PRN
  Administered 2021-06-15: 60 mg via INTRAVENOUS

## 2021-06-15 SURGICAL SUPPLY — 37 items
ADH SKN CLS APL DERMABOND .7 (GAUZE/BANDAGES/DRESSINGS) ×1
APL PRP STRL LF DISP 70% ISPRP (MISCELLANEOUS) ×1
BINDER BREAST XLRG (GAUZE/BANDAGES/DRESSINGS) IMPLANT
BINDER BREAST XXLRG (GAUZE/BANDAGES/DRESSINGS) IMPLANT
BLADE SURG 15 STRL LF DISP TIS (BLADE) ×1 IMPLANT
BLADE SURG 15 STRL SS (BLADE) ×2
CHLORAPREP W/TINT 26 (MISCELLANEOUS) ×2 IMPLANT
COVER BACK TABLE 60X90IN (DRAPES) ×2 IMPLANT
COVER MAYO STAND STRL (DRAPES) ×2 IMPLANT
COVER PROBE W GEL 5X96 (DRAPES) ×2 IMPLANT
DERMABOND ADVANCED (GAUZE/BANDAGES/DRESSINGS) ×1
DERMABOND ADVANCED .7 DNX12 (GAUZE/BANDAGES/DRESSINGS) ×1 IMPLANT
DRAPE LAPAROSCOPIC ABDOMINAL (DRAPES) ×2 IMPLANT
DRAPE UTILITY XL STRL (DRAPES) ×2 IMPLANT
ELECT COATED BLADE 2.86 ST (ELECTRODE) ×2 IMPLANT
ELECT REM PT RETURN 9FT ADLT (ELECTROSURGICAL) ×2
ELECTRODE REM PT RTRN 9FT ADLT (ELECTROSURGICAL) ×1 IMPLANT
GLOVE SURG ENC MOIS LTX SZ7 (GLOVE) ×4 IMPLANT
GLOVE SURG UNDER POLY LF SZ7.5 (GLOVE) ×2 IMPLANT
GOWN STRL REUS W/ TWL LRG LVL3 (GOWN DISPOSABLE) ×2 IMPLANT
GOWN STRL REUS W/TWL LRG LVL3 (GOWN DISPOSABLE) ×4
KIT MARKER MARGIN INK (KITS) ×2 IMPLANT
NEEDLE HYPO 25X1 1.5 SAFETY (NEEDLE) ×2 IMPLANT
PACK BASIN DAY SURGERY FS (CUSTOM PROCEDURE TRAY) ×2 IMPLANT
PENCIL SMOKE EVACUATOR (MISCELLANEOUS) ×2 IMPLANT
SLEEVE SCD COMPRESS KNEE MED (STOCKING) ×2 IMPLANT
SPONGE T-LAP 4X18 ~~LOC~~+RFID (SPONGE) ×2 IMPLANT
STRIP CLOSURE SKIN 1/2X4 (GAUZE/BANDAGES/DRESSINGS) ×2 IMPLANT
SUT MON AB 5-0 PS2 18 (SUTURE) ×2 IMPLANT
SUT SILK 2 0 SH (SUTURE) ×2 IMPLANT
SUT VIC AB 2-0 SH 27 (SUTURE) ×4
SUT VIC AB 2-0 SH 27XBRD (SUTURE) ×2 IMPLANT
SUT VIC AB 3-0 SH 27 (SUTURE) ×2
SUT VIC AB 3-0 SH 27X BRD (SUTURE) ×1 IMPLANT
SYR CONTROL 10ML LL (SYRINGE) ×2 IMPLANT
TOWEL GREEN STERILE FF (TOWEL DISPOSABLE) ×2 IMPLANT
TRAY FAXITRON CT DISP (TRAY / TRAY PROCEDURE) ×2 IMPLANT

## 2021-06-15 NOTE — Anesthesia Preprocedure Evaluation (Signed)
Anesthesia Evaluation  Patient identified by MRN, date of birth, ID band Patient awake    Reviewed: Allergy & Precautions, NPO status , Patient's Chart, lab work & pertinent test results  Airway Mallampati: II  TM Distance: >3 FB Neck ROM: Full    Dental no notable dental hx.    Pulmonary neg pulmonary ROS,    Pulmonary exam normal breath sounds clear to auscultation       Cardiovascular hypertension, Pt. on medications negative cardio ROS Normal cardiovascular exam Rhythm:Regular Rate:Normal     Neuro/Psych Anxiety negative neurological ROS  negative psych ROS   GI/Hepatic negative GI ROS, Neg liver ROS,   Endo/Other  negative endocrine ROS  Renal/GU negative Renal ROS  negative genitourinary   Musculoskeletal  (+) Arthritis , Osteoarthritis,    Abdominal (+) + obese,   Peds negative pediatric ROS (+)  Hematology negative hematology ROS (+)   Anesthesia Other Findings Breast Cancer  Reproductive/Obstetrics negative OB ROS                             Anesthesia Physical Anesthesia Plan  ASA: 3  Anesthesia Plan: General   Post-op Pain Management:    Induction: Intravenous  PONV Risk Score and Plan: 3 and Ondansetron, Dexamethasone, Midazolam and Treatment may vary due to age or medical condition  Airway Management Planned: LMA  Additional Equipment:   Intra-op Plan:   Post-operative Plan: Extubation in OR  Informed Consent: I have reviewed the patients History and Physical, chart, labs and discussed the procedure including the risks, benefits and alternatives for the proposed anesthesia with the patient or authorized representative who has indicated his/her understanding and acceptance.     Dental advisory given  Plan Discussed with: CRNA  Anesthesia Plan Comments:         Anesthesia Quick Evaluation

## 2021-06-15 NOTE — Anesthesia Procedure Notes (Signed)
Procedure Name: LMA Insertion Date/Time: 06/15/2021 8:39 AM Performed by: Lavonia Dana, CRNA Pre-anesthesia Checklist: Patient identified, Emergency Drugs available, Suction available and Patient being monitored Patient Re-evaluated:Patient Re-evaluated prior to induction Oxygen Delivery Method: Circle system utilized Preoxygenation: Pre-oxygenation with 100% oxygen Induction Type: IV induction Ventilation: Mask ventilation without difficulty LMA: LMA inserted LMA Size: 4.0 Number of attempts: 1 Airway Equipment and Method: Bite block Placement Confirmation: positive ETCO2 Tube secured with: Tape Dental Injury: Teeth and Oropharynx as per pre-operative assessment

## 2021-06-15 NOTE — Transfer of Care (Signed)
Immediate Anesthesia Transfer of Care Note  Patient: Jillian Buchanan  Procedure(s) Performed: LEFT BREAST LUMPECTOMY WITH RADIOACTIVE SEED LOCALIZATION (Left: Breast)  Patient Location: PACU  Anesthesia Type:General  Level of Consciousness: awake, alert  and oriented  Airway & Oxygen Therapy: Patient Spontanous Breathing and Patient connected to face mask oxygen  Post-op Assessment: Report given to RN and Post -op Vital signs reviewed and stable  Post vital signs: Reviewed and stable  Last Vitals:  Vitals Value Taken Time  BP 142/73 06/15/21 0934  Temp    Pulse 80 06/15/21 0935  Resp 20 06/15/21 0935  SpO2 100 % 06/15/21 0935  Vitals shown include unvalidated device data.  Last Pain:  Vitals:   06/15/21 0758  TempSrc: Oral  PainSc: 0-No pain      Patients Stated Pain Goal: 4 ([id number])  Complications: No notable events documented.

## 2021-06-15 NOTE — Anesthesia Postprocedure Evaluation (Signed)
Anesthesia Post Note  Patient: Jillian Buchanan  Procedure(s) Performed: LEFT BREAST LUMPECTOMY WITH RADIOACTIVE SEED LOCALIZATION (Left: Breast)     Patient location during evaluation: PACU Anesthesia Type: General Level of consciousness: awake and alert Pain management: pain level controlled Vital Signs Assessment: post-procedure vital signs reviewed and stable Respiratory status: spontaneous breathing, nonlabored ventilation and respiratory function stable Cardiovascular status: blood pressure returned to baseline and stable Postop Assessment: no apparent nausea or vomiting Anesthetic complications: no   No notable events documented.  Last Vitals:  Vitals:   06/15/21 1000 06/15/21 1015  BP: 140/73 (!) 146/81  Pulse: 80 76  Resp: 12 12  Temp:  36.6 C  SpO2: 96% 96%    Last Pain:  Vitals:   06/15/21 1015  TempSrc:   PainSc: 0-No pain                 Lynda Rainwater

## 2021-06-15 NOTE — Telephone Encounter (Signed)
PA required for Gelsyn-3, bilateral knee. PA pending.

## 2021-06-15 NOTE — Op Note (Signed)
Preoperative diagnosis: Clinical stage I left breast cancer Postoperative diagnosis: Same as above Procedure: Left breast radioactive seed guided lumpectomy Surgeon: Dr. Serita Grammes Anesthesia: General  Estimated blood loss: 20 cc Complications: None Drains: None Specimens: 1. Left breast tissue marked with paint containing seed and clip 2.  Additional anterior, inferior and superior margins marked short superior, long lateral, double deep Sponge and count was correct completion Decision to recovery stable condition   Indications:  87 y.o. female with history in 1987 of a left breast cancer treated with lumpectomy/alnd/xrt and tamoxifen. She underwent mm that showed a mass left upper medial quadrant. She has no mass noted or dc. On [place] she has a 8x8x5 mm mass, ax [place] is negative. Biopsy of the mass is grade II IDC with DCIS that is 100% er pos, 2% pr pos, her 2 pos (borderline) and Ki 15%. Was recommended by pathology to recheck her 2 on final pathology. We discussed proceeding with lumpectomy alone.    Procedure: After informed consent was obtained the patient was taken to the OR.  She was given antibiotics.  SCDs were placed.  She was then placed under general anesthesia without complication.  She was prepped and draped in standard sterile surgical fashion.  Surgical timeout was then performed.   The seed was in the medial breast.  I infiltrated Marcaine and made a  periareolar incision to hide the scar later.   I then dissected to the seed.  I removed the seed and the surrounding tissue in order to get a clear margin.  I then marked this with paint and passed off the table.  The seed and clip were present in the specimen.  I then removed additional margins as above as these appeared close to me. the anterior margin was very close and this is now skin.  I then closed the cavity with 2-0 Vicryl.  The skin was closed with 3-0 Vicryl 4-0 Monocryl.  Glue and Steri-Strips were later applied.  She  tolerated this well was extubated and transferred to recovery stable

## 2021-06-15 NOTE — Interval H&P Note (Signed)
History and Physical Interval Note:  06/15/2021 8:16 AM  Jillian Buchanan  has presented today for surgery, with the diagnosis of LEFT BREAST CANCER.  The various methods of treatment have been discussed with the patient and family. After consideration of risks, benefits and other options for treatment, the patient has consented to  Procedure(s): LEFT BREAST LUMPECTOMY WITH RADIOACTIVE SEED LOCALIZATION (Left) as a surgical intervention.  The patient's history has been reviewed, patient examined, no change in status, stable for surgery.  I have reviewed the patient's chart and labs.  Questions were answered to the patient's satisfaction.     Rolm Bookbinder

## 2021-06-15 NOTE — Discharge Instructions (Addendum)
[facility] Office Phone Number [phone]  BREAST BIOPSY/ PARTIAL MASTECTOMY: POST OP INSTRUCTIONS Take 400 mg of ibuprofen every 8 hours or 650 mg tylenol every 6 hours for next 72 hours then as needed. Use ice several times daily also. Always review your discharge instruction sheet given to you by the facility where your surgery was performed.  IF YOU HAVE DISABILITY OR FAMILY LEAVE FORMS, YOU MUST BRING THEM TO THE OFFICE FOR PROCESSING.  DO NOT GIVE THEM TO YOUR DOCTOR.  A prescription for pain medication may be given to you upon discharge.  Take your pain medication as prescribed, if needed.  If narcotic pain medicine is not needed, then you may take acetaminophen (Tylenol), naprosyn (Alleve) or ibuprofen (Advil) as needed. Take your usually prescribed medications unless otherwise directed If you need a refill on your pain medication, please contact your pharmacy.  They will contact our office to request authorization.  Prescriptions will not be filled after 5pm or on week-ends. You should eat very light the first 24 hours after surgery, such as soup, crackers, pudding, etc.  Resume your normal diet the day after surgery. Most patients will experience some swelling and bruising in the breast.  Ice packs and a good support bra will help.  Wear the breast binder provided or a sports bra for 72 hours day and night.  After that wear a sports bra during the day until you return to the office. Swelling and bruising can take several days to resolve.  It is common to experience some constipation if taking pain medication after surgery.  Increasing fluid intake and taking a stool softener will usually help or prevent this problem from occurring.  A mild laxative (Milk of Magnesia or Miralax) should be taken according to package directions if there are no bowel movements after 48 hours. Unless discharge instructions indicate otherwise, you may remove your bandages 48 hours after surgery  and you may shower at that time.  You may have steri-strips (small skin tapes) in place directly over the incision.  These strips should be left on the skin for 7-10 days and will come off on their own.  If your surgeon used skin glue on the incision, you may shower in 24 hours.  The glue will flake off over the next 2-3 weeks.  Any sutures or staples will be removed at the office during your follow-up visit. ACTIVITIES:  You may resume regular daily activities (gradually increasing) beginning the next day.  Wearing a good support bra or sports bra minimizes pain and swelling.  You may have sexual intercourse when it is comfortable. You may drive when you no longer are taking prescription pain medication, you can comfortably wear a seatbelt, and you can safely maneuver your car and apply brakes. RETURN TO WORK:  ______________________________________________________________________________________ You should see your doctor in the office for a follow-up appointment approximately two weeks after your surgery.  Your doctor's nurse will typically make your follow-up appointment when she calls you with your pathology report.  Expect your pathology report 3-4 business days after your surgery.  You may call to check if you do not hear from us after three days. OTHER INSTRUCTIONS: _______________________________________________________________________________________________ _____________________________________________________________________________________________________________________________________ _____________________________________________________________________________________________________________________________________ _____________________________________________________________________________________________________________________________________  WHEN TO CALL DR WAKEFIELD: Fever over 101.0 Nausea and/or vomiting. Extreme swelling or bruising. Continued bleeding from incision. Increased  pain, redness, or drainage from the incision.  The clinic staff is available to answer your questions during regular business hours.  Please don't hesitate to call   and ask to speak to one of the nurses for clinical concerns.  If you have a medical emergency, go to the nearest emergency room or call 911.  A surgeon from [facility] is always on call at the hospital.  For further questions, please visit [email or web address]   May take Tylenol after 2pm, if needed.    Post Anesthesia Home Care Instructions  Activity: Get plenty of rest for the remainder of the day. A responsible individual must stay with you for 24 hours following the procedure.  For the next 24 hours, DO NOT: -Drive a car -Paediatric nurse -Drink alcoholic beverages -Take any medication unless instructed by your physician -Make any legal decisions or sign important papers.  Meals: Start with liquid foods such as gelatin or soup. Progress to regular foods as tolerated. Avoid greasy, spicy, heavy foods. If nausea and/or vomiting occur, drink only clear liquids until the nausea and/or vomiting subsides. Call your physician if vomiting continues.  Special Instructions/Symptoms: Your throat may feel dry or sore from the anesthesia or the breathing tube placed in your throat during surgery. If this causes discomfort, gargle with warm salt water. The discomfort should disappear within 24 hours.  If you had a scopolamine patch placed behind your ear for the management of post- operative nausea and/or vomiting:  1. The medication in the patch is effective for 72 hours, after which it should be removed.  Wrap patch in a tissue and discard in the trash. Wash hands thoroughly with soap and water. 2. You may remove the patch earlier than 72 hours if you experience unpleasant side effects which may include dry mouth, dizziness or visual disturbances. 3. Avoid touching the patch. Wash your hands with soap and  water after contact with the patch.

## 2021-06-15 NOTE — H&P (Addendum)
87 y.o. female who is seen today as an office consultation at the request of Dr. Delfina Redwood for left breast cancer.  She has history in 1987 of a left breast cancer treated with lumpectomy/alnd/xrt and tamoxifen. She underwent mm that showed a mass left upper medial quadrant. She has no mass noted or dc. She does have five aunts with breast cancer, no one has had genetics before. On [place] she has a 8x8x5 mm mass, ax [place] is negative. Biopsy of the mass is grade II IDC with DCIS that is 100% er pos, 2% pr pos, her 2 pos (borderline) and Ki 15%. Was recommended by pathology to recheck her 2 on final pathology. She is retired. She is here with her husband of 51 years and her son. She is fairly healthy. Has history of htn, dm.   Review of Systems: A complete review of systems was obtained from the patient. I have reviewed this information and discussed as appropriate with the patient. See HPI as well for other ROS.  Review of Systems  Musculoskeletal: Positive for joint pain.  All other systems reviewed and are negative.  Medical History: History reviewed. No pertinent past medical history.  Patient Active Problem List  Diagnosis   Bilateral primary osteoarthritis of knee   Chronic right shoulder pain   Malignant neoplasm of upper-inner quadrant of left breast in female, estrogen receptor positive (CMS-HCC)   Obesity   Unilateral primary osteoarthritis, left knee   Diabetes (CMS-HCC)   Hypertension, essential   Hypothyroidism   Past Surgical History:  Procedure Laterality Date   BIOPSY/EXCISION LYMPH NODE AXILLARY Left 1987  ALND   CATARACT EXTRACTION   HYSTERECTOMY TOTAL ABDOMINAL W/REMOVAL TUBES &/OR OVARIES   MASTECTOMY PARTIAL / LUMPECTOMY Left  1987   No Known Allergies  Current Outpatient Medications on File Prior to Visit  Medication Sig Dispense Refill   acetaminophen (TYLENOL) 325 MG tablet Take 650 mg by mouth every 4 (four) hours as needed   ALPRAZolam (XANAX) 0.5 MG tablet Take  0.5 mg by mouth nightly as needed   aspirin 81 MG EC tablet Take 81 mg by mouth once daily   diclofenac (VOLTAREN) 1 % topical gel Apply 2 g topically 4 (four) times daily   levothyroxine (SYNTHROID) 88 MCG tablet Take 88 mcg by mouth once daily Take on an empty stomach with a glass of water at least 30-60 minutes before breakfast.   naproxen sodium (ALEVE) 220 MG tablet Take 220 mg by mouth 2 (two) times daily with meals   amLODIPine (NORVASC) 5 MG tablet Take 5 mg by mouth once daily   bumetanide (BUMEX) 0.5 MG tablet Take by mouth   No current facility-administered medications on file prior to visit.   Family History  Problem Relation Age of Onset   Breast cancer Maternal Aunt    Social History   Tobacco Use  Smoking Status Never Smoker  Smokeless Tobacco Never Used    Social History   Socioeconomic History   Marital status: Married  Tobacco Use   Smoking status: Never Smoker   Smokeless tobacco: Never Used  Substance and Sexual Activity   Alcohol use: Never   Objective:   Physical Exam Vitals reviewed.  Constitutional:  Appearance: Normal appearance.  Cardiovascular:  Rate and Rhythm: Normal rate.  Pulmonary:  Effort: Pulmonary effort is normal.  Chest:  Breasts:  Right: No mass or nipple discharge.  Left: No mass or nipple discharge.   Comments: Left breast incisions healed well, changes  c/w radiotherapy Lymphadenopathy:  Upper Body:  Right upper body: No supraclavicular or axillary adenopathy.  Left upper body: No supraclavicular or axillary adenopathy.  Neurological:  Mental Status: She is alert.   Assessment and Plan:  Diagnoses and all orders for this visit:  Malignant neoplasm of upper-inner quadrant of left breast in female, estrogen receptor positive (CMS-HCC)  Left breast seed guided lumpectomy  We discussed staging and pathophysiology of breast cancer and all available treatments. I dont think she needs sn biopsy due to prior surgery,  negative [place], age and this wont either change treatment from oncology or survival.  She is likely going to get recommended herceptin and with poor veins may need port. I would like to avoid if possible so oncology investigating possible sq or may just wait until see final pathology. I discussed lumpectomy and mastectomy. I think with this small tumor and her age reasonable to consider lumpectomy. Discussed performance with radioactive seed and possible positive margin risk necessitating a second surgery. She is agreeable to this. I will move forward and schedule for surgery.

## 2021-06-17 ENCOUNTER — Encounter (HOSPITAL_BASED_OUTPATIENT_CLINIC_OR_DEPARTMENT_OTHER): Payer: Self-pay | Admitting: General Surgery

## 2021-06-18 DIAGNOSIS — Z1379 Encounter for other screening for genetic and chromosomal anomalies: Secondary | ICD-10-CM | POA: Insufficient documentation

## 2021-06-22 ENCOUNTER — Encounter: Payer: Self-pay | Admitting: *Deleted

## 2021-06-23 ENCOUNTER — Telehealth: Payer: Self-pay

## 2021-06-23 NOTE — Telephone Encounter (Signed)
Called and left a message with patient's husband for patient to CB to schedule for gel injections with Dr. Durward Fortes.  Approved for Gelsyn-3, bilateral knee. [place] Patient will be responsible for 20% OOP. May have a Co-pay of $119.00 per visit PA Approval# [id number] Valid 06/14/2021- 12/10/2021

## 2021-06-24 ENCOUNTER — Encounter: Payer: Self-pay | Admitting: *Deleted

## 2021-06-25 ENCOUNTER — Encounter: Payer: Self-pay | Admitting: *Deleted

## 2021-06-25 LAB — SURGICAL PATHOLOGY

## 2021-06-28 ENCOUNTER — Encounter: Payer: Self-pay | Admitting: Genetic Counselor

## 2021-06-28 ENCOUNTER — Telehealth: Payer: Self-pay | Admitting: Genetic Counselor

## 2021-06-28 ENCOUNTER — Ambulatory Visit: Payer: Self-pay | Admitting: Genetic Counselor

## 2021-06-28 DIAGNOSIS — Z1379 Encounter for other screening for genetic and chromosomal anomalies: Secondary | ICD-10-CM

## 2021-06-28 NOTE — Telephone Encounter (Signed)
Revealed negative genetic testing. Discussed that we do not know why she has had breast cancer or why there is cancer in the family. There could be a genetic mutation in the family that Jillian Buchanan did not inherit. There could also be a mutation in a different gene that we are not testing, or our current technology may not be able to detect certain mutations. It will therefore be important for her to stay in contact with genetics to keep up with whether additional testing may be appropriate in the future.   Three variants of uncertain significance were detected - one in the ATM gene called p.F1265L (c.3793T>C), a second in the LZTR1 gene called c.2414delA (T.K240XBD*5), and a third in the [place] gene called p.P58L (c.173C>T). Her result is still considered normal at this time and should not impact her medical management.

## 2021-06-28 NOTE — Progress Notes (Signed)
HPI:  [name] was previously seen in the [facility] due to a personal and family history of cancer and concerns regarding a hereditary predisposition to cancer. Please refer to our prior cancer genetics clinic note for more information regarding our discussion, assessment and recommendations, at the time. [name] recent genetic test results were disclosed to her, as were recommendations warranted by these results. These results and recommendations are discussed in more detail below.  CANCER HISTORY:  Oncology History  Malignant neoplasm of upper-inner quadrant of left breast in female, estrogen receptor positive ([place])  05/24/2021 Initial Diagnosis   Malignant neoplasm of upper-inner quadrant of left breast in female, estrogen receptor positive ([place])   05/26/2021 Cancer Staging   Staging form: Breast, AJCC 8th Edition - Clinical stage from 05/26/2021: Stage IA (cT1b, cN0, cM0, G2, ER+, PR+, HER2+) - Signed by Chauncey Cruel, MD on 05/26/2021 Stage prefix: Initial diagnosis Histologic grading system: 3 grade system Laterality: Left Staged by: Pathologist and managing physician Stage used in treatment planning: Yes National guidelines used in treatment planning: Yes Type of national guideline used in treatment planning: NCCN   06/08/2021 -  Chemotherapy      Patient is on Antibody Plan: BREAST TRASTUZUMAB Q21D     06/18/2021 Genetic Testing   Negative genetic testing:  No pathogenic variants detected on the [employer] CancerNext-Expanded + RNAinsight panel. Three variants of uncertain significance (VUS) were detected - one in the ATM gene called p.F1265L (c.3793T>C), a second in the LTZR1 gene called c.2414delA (R.J188CZY*6), and a third in the [place] gene called p.P58L (c.173C>T). The report date is 06/18/2021.  The CancerNext-Expanded + RNAinsight gene panel offered by [employer] and includes sequencing and rearrangement analysis for the following 77 genes: AIP, ALK,  APC, ATM, AXIN2, BAP1, BARD1, BLM, BMPR1A, BRCA1, BRCA2, BRIP1, CDC73, CDH1, CDK4, CDKN1B, CDKN2A, CHEK2, CTNNA1, DICER1, FANCC, FH, FLCN, GALNT12, KIF1B, LZTR1, MAX, MEN1, MET, MLH1, MSH2, MSH3, MSH6, MUTYH, NBN, NF1, NF2, NTHL1, PALB2, PHOX2B, PMS2, POT1, PRKAR1A, PTCH1, PTEN, RAD51C, RAD51D, RB1, RECQL, RET, SDHA, SDHAF2, SDHB, SDHC, SDHD, SMAD4, SMARCA4, SMARCB1, SMARCE1, STK11, SUFU, TMEM127, TP53, TSC1, TSC2, VHL and XRCC2 (sequencing and deletion/duplication); EGFR, EGLN1, HOXB13, KIT, MITF, PDGFRA, POLD1 and POLE (sequencing only); EPCAM and GREM1 (deletion/duplication only). RNA data is routinely analyzed for use in variant interpretation for all genes.      FAMILY HISTORY:  We obtained a detailed, 4-generation family history.  Significant diagnoses are listed below: Family History  Problem Relation Age of Onset   Breast cancer Maternal Aunt        x5 aunts, dx >50   Breast cancer Paternal Aunt        dx >50   Breast cancer Cousin        x4 maternal first cousins   Bone cancer Daughter 79   [name] has one son (age 33) and had one daughter who died at age 65 from bone cancer. She had two brothers, neither of whom had cancer.   [name] mother died at age 17 without cancer. There were six maternal aunts and three maternal uncles. Five maternal aunts had breast cancer (all diagnosed older than 82). Four maternal cousins had breast cancer, some diagnosed older than 22 and some diagnosed younger than 97. [name] maternal grandmother died in her 38s or 29s without cancer. Her maternal grandfather died in his 62s or 69s without cancer.    [name] father died at age 68 without cancer. There  were four paternal aunts and two paternal uncles. One paternal aunt had breast cancer older than 75. There is no known cancer among paternal cousins. [name] paternal grandmother died younger than 23 without cancer. Her paternal grandfather died older than 46 without cancer.   [name] is  unaware of previous family history of genetic testing for hereditary cancer risks. Patient's ancestors are of Black/African American descent. There is no reported Ashkenazi Jewish ancestry. There is no known consanguinity.  GENETIC TEST RESULTS: Genetic testing reported out on 06/18/2021 through the [employer] CancerNext-Expanded + RNAinsight panel. No pathogenic variants were detected.   The CancerNext-Expanded + RNAinsight gene panel offered by [employer] and includes sequencing and rearrangement analysis for the following 77 genes: AIP, ALK, APC, ATM, AXIN2, BAP1, BARD1, BLM, BMPR1A, BRCA1, BRCA2, BRIP1, CDC73, CDH1, CDK4, CDKN1B, CDKN2A, CHEK2, CTNNA1, DICER1, FANCC, FH, FLCN, GALNT12, KIF1B, LZTR1, MAX, MEN1, MET, MLH1, MSH2, MSH3, MSH6, MUTYH, NBN, NF1, NF2, NTHL1, PALB2, PHOX2B, PMS2, POT1, PRKAR1A, PTCH1, PTEN, RAD51C, RAD51D, RB1, RECQL, RET, SDHA, SDHAF2, SDHB, SDHC, SDHD, SMAD4, SMARCA4, SMARCB1, SMARCE1, STK11, SUFU, TMEM127, TP53, TSC1, TSC2, VHL and XRCC2 (sequencing and deletion/duplication); EGFR, EGLN1, HOXB13, KIT, MITF, PDGFRA, POLD1 and POLE (sequencing only); EPCAM and GREM1 (deletion/duplication only). RNA data is routinely analyzed for use in variant interpretation for all genes. The test report will be scanned into EPIC and located under the Molecular Pathology section of the Results Review tab.  A portion of the result report is included below for reference.     We discussed with [name] that because current genetic testing is not perfect, it is possible there may be a gene mutation in one of these genes that current testing cannot detect, but that chance is small.  We also discussed that there could be another gene that has not yet been discovered, or that we have not yet tested, that is responsible for the cancer diagnoses in the family. It is also possible there is a hereditary cause for the cancer in the family that [name] did not inherit and therefore was not identified in her  testing.  Therefore, it is important to remain in touch with cancer genetics in the future so that we can continue to offer [name] the most up to date genetic testing.   Genetic testing did identify three variants of uncertain significance (VUS) - one in the ATM gene called p.F1265L (c.3793T>C), a second in the LTZR1 gene called c.2414delA (Z.O109UEA*5), and a third in the [place] gene called  p.P58L (c.173C>T). At this time, it is unknown if these variants are associated with increased cancer risk or if they are normal findings, but most variants such as these get reclassified to being inconsequential. They should not be used to make medical management decisions. With time, we suspect the lab will determine the significance of these variants, if any. If we do learn more about them, we will try to contact [name] to discuss it further. However, it is important to stay in touch with [place] periodically and keep the address and phone number up to date.   ADDITIONAL GENETIC TESTING: We discussed with [name] that her genetic testing was fairly extensive.  If there are genes identified to increase cancer risk that can be analyzed in the future, we would be happy to discuss and coordinate this testing at that time.    CANCER SCREENING RECOMMENDATIONS: [name] test result is considered negative (normal).  This means that we have not identified a  hereditary cause for her personal and family history of cancer at this time. While reassuring, this does not definitively rule out a hereditary predisposition to cancer. It is still possible that there could be genetic mutations that are undetectable by current technology. There could be genetic mutations in genes that have not been tested or identified to increase cancer risk.  Therefore, it is recommended she continue to follow the cancer management and screening guidelines provided by her oncology and primary healthcare provider.   An individual's cancer risk and  medical management are not determined by genetic test results alone. Overall cancer risk assessment incorporates additional factors, including personal medical history, family history, and any available genetic information that may result in a personalized plan for cancer prevention and surveillance.  RECOMMENDATIONS FOR FAMILY MEMBERS:  Individuals in this family might be at some increased risk of developing cancer, over the general population risk, simply due to the family history of cancer.  We recommended women in this family have a yearly mammogram beginning at age 74, or 42 years younger than the earliest onset of cancer, an annual clinical breast exam, and perform monthly breast self-exams. Women in this family should also have a gynecological exam as recommended by their primary provider. All family members should be referred for colonoscopy starting at age 62.  It is also possible there is a hereditary cause for the cancer in [name]'s family that she did not inherit and therefore was not identified in her.  Based on [name]'s family history, we recommended her maternal first cousins, especially those who have had breast cancer, have genetic counseling and testing. [name] will let us know if we can be of any assistance in coordinating genetic counseling and/or testing for these family members.   FOLLOW-UP: Lastly, we discussed with [name] that cancer genetics is a rapidly advancing field and it is possible that new genetic tests will be appropriate for her and/or her family members in the future. We encouraged her to remain in contact with cancer genetics on an annual basis so we can update her personal and family histories and let her know of advances in cancer genetics that may benefit this family.   Our contact number was provided. [name] questions were answered to her satisfaction, and she knows she is welcome to call us at anytime with additional questions or concerns.   Clint Guy, MS, [facility] Genetic Counselor [email or web address] Phone: [phone]

## 2021-06-29 ENCOUNTER — Encounter: Payer: Self-pay | Admitting: Oncology

## 2021-07-07 ENCOUNTER — Other Ambulatory Visit: Payer: Self-pay

## 2021-07-07 ENCOUNTER — Inpatient Hospital Stay: Payer: Medicare Other | Attending: Oncology | Admitting: Oncology

## 2021-07-07 VITALS — BP 154/70 | HR 81 | Temp 97.7°F | Resp 18 | Ht 64.0 in | Wt 216.6 lb

## 2021-07-07 DIAGNOSIS — Z808 Family history of malignant neoplasm of other organs or systems: Secondary | ICD-10-CM | POA: Insufficient documentation

## 2021-07-07 DIAGNOSIS — C50212 Malignant neoplasm of upper-inner quadrant of left female breast: Secondary | ICD-10-CM | POA: Diagnosis not present

## 2021-07-07 DIAGNOSIS — Z90710 Acquired absence of both cervix and uterus: Secondary | ICD-10-CM | POA: Diagnosis not present

## 2021-07-07 DIAGNOSIS — Z5112 Encounter for antineoplastic immunotherapy: Secondary | ICD-10-CM | POA: Insufficient documentation

## 2021-07-07 DIAGNOSIS — Z170 Estrogen receptor positive status [ER+]: Secondary | ICD-10-CM | POA: Insufficient documentation

## 2021-07-07 DIAGNOSIS — Z803 Family history of malignant neoplasm of breast: Secondary | ICD-10-CM | POA: Insufficient documentation

## 2021-07-07 MED ORDER — ANASTROZOLE 1 MG PO TABS: 1.0000 mg | ORAL_TABLET | Freq: Every day | ORAL | 4 refills | Status: DC

## 2021-07-07 NOTE — Progress Notes (Signed)
[place]  [phone] [phone]     ID: Jillian Buchanan DOB: [birth year]  MR#: [id number]  HQP#:[id number]  Patient Care Team: Seward Carol, MD as PCP - General (Internal Medicine) Mauro Kaufmann, RN as Oncology Nurse Navigator Rockwell Germany, RN as Oncology Nurse Navigator Rolm Bookbinder, MD as Consulting Physician (General Surgery) Marshelle Bilger, Virgie Dad, MD as Consulting Physician (Oncology) Eppie Gibson, MD as Attending Physician (Radiation Oncology) Garald Balding, MD as Consulting Physician (Orthopedic Surgery) Shon Hough, MD as Consulting Physician (Ophthalmology) Bensimhon, Shaune Pascal, MD as Consulting Physician (Cardiology) Chauncey Cruel, MD OTHER MD:  CHIEF COMPLAINT: Triple positive breast cancer  CURRENT TREATMENT: Anastrozole, trastuzumab/high elect   INTERVAL HISTORY: Jillian Buchanan returns today for follow up of her triple positive breast cancer. She was evaluated in the multidisciplinary breast cancer clinic on 05/26/2021.  She is accompanied by her husband  Results from her genetic testing performed during clinic were negative, with the exception of three variants of uncertain significance (one each in ATM, LZTR1, and NTHL1).  Since consultation, she underwent echocardiogram on 06/07/2021 showing an ejection fraction of 55-60%.  She proceeded to left lumpectomy on 06/15/2021 under Dr. Donne Hazel. Pathology from the procedure [id number]) showed: invasive ductal carcinoma, grade 2, 1.4 cm; ductal carcinoma in situ, intermediate grade; calcifications associated with carcinoma; margins uninvolved.  Recall radiation therapy is not recommended given her previous treatment in 1987.   REVIEW OF SYSTEMS: Blaine did very well with her surgery, with no problems with pain or bleeding.  She is now going to the wife which she does not enjoy but is willing to do 3 times a week because "it is good for me".  I strongly commended that  decision.  Otherwise a detailed review of systems today was stable.  Note that she and her husband keep their grandson after school so any treatments here will have to be done no later than 1 PM   COVID 19 VACCINATION STATUS: Status post [place] x3 as of July 2022   HISTORY OF CURRENT ILLNESS: From the original intake note:  Jillian Buchanan has a history of prior left breast cancer. She underwent left lumpectomy with node dissection in 07/1986 and received radiation therapy followed by tamoxifen for 5 years.  More recently, she had routine screening mammography on 04/27/2021 showing a possible abnormality in the left breast. She underwent left diagnostic mammography with tomography and left breast ultrasonography at [facility] on 05/12/2021 showing: breast density category B; 0.8 cm lobulated mass in left breast at 9-10 o'clock; no significant left axilla abnormalities.  Accordingly on 05/18/2021 she proceeded to biopsy of the left breast area in question. The pathology from this procedure ([id number]) showed: invasive ductal carcinoma, grade 2; ductal carcinoma in situ. Prognostic indicators significant for: estrogen receptor, 100% positive and progesterone receptor, 2% positive, both with strong staining intensity. Proliferation marker Ki67 at 15%. HER2 equivocal by immunohistochemistry (2+), but positive by fluorescent in situ hybridization with a signals ratio 2 and number per cell 4.15.  Cancer Staging Malignant neoplasm of upper-inner quadrant of left breast in female, estrogen receptor positive ([place]) Staging form: Breast, AJCC 8th Edition - Clinical stage from 05/26/2021: Stage IA (cT1b, cN0, cM0, G2, ER+, PR+, HER2+) - Signed by Chauncey Cruel, MD on 05/26/2021 Stage prefix: Initial diagnosis Histologic grading system: 3 grade system Laterality: Left Staged by: Pathologist and managing physician Stage used in treatment planning: Yes National guidelines used in treatment planning: Yes Type of  national  guideline used in treatment planning: NCCN  The patient's subsequent history is as detailed below.   PAST MEDICAL HISTORY: Past Medical History:  Diagnosis Date   Anxiety    self reported   Breast cancer ([place])    Cataract    Family history of bone cancer    Family history of breast cancer    Hypertension     PAST SURGICAL HISTORY: Past Surgical History:  Procedure Laterality Date   ABDOMINAL HYSTERECTOMY     BREAST LUMPECTOMY  1988   BREAST LUMPECTOMY WITH RADIOACTIVE SEED LOCALIZATION Left 06/15/2021   Procedure: LEFT BREAST LUMPECTOMY WITH RADIOACTIVE SEED LOCALIZATION;  Surgeon: Rolm Bookbinder, MD;  Location: [facility];  Service: General;  Laterality: Left;   INTRAOCULAR PROSTHESES INSERTION  8/248/2005    FAMILY HISTORY: Family History  Problem Relation Age of Onset   Breast cancer Maternal Aunt        x5 aunts, dx >50   Breast cancer Paternal Aunt        dx >50   Breast cancer Cousin        x4 maternal first cousins   Bone cancer Daughter 50   Her father died at age 67 from CVA. Her mother died at age 59. Jillian Buchanan had two brothers. She reports breast cancer in five maternal aunts and one paternal aunt. Aside from those aunts, there is no additional family history of cancer to her knowledge.   GYNECOLOGIC HISTORY:  No LMP recorded. Patient has had a hysterectomy. Menarche: 87 years old Age at first live birth: 87 years old [place] P 2 LMP 1977, with hysterectomy Contraceptive: used for 5 years without issue HRT used for 5 years  Hysterectomy? Yes, 1977 BSO?  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 Husband [name] is retired from working as a Environmental consultant for [employer]. Son Jillian Buchanan "Fraser Din," age 54, is a Corporate investment banker here in [place]. Daughter Jillian Buchanan died at age 80 on 04-29-16 shortly after being diagnosed with  cancer.    ADVANCED DIRECTIVES: in place   HEALTH MAINTENANCE: Social History   Tobacco Use   Smoking status: Never   Smokeless tobacco: Never  Vaping Use   Vaping Use: Never used  Substance Use Topics   Alcohol use: Yes    Comment: rarely, social   Drug use: No     Colonoscopy: never done  PAP: 2014  Bone density: 03/2021   No Known Allergies  Current Outpatient Medications  Medication Sig Dispense Refill   anastrozole (ARIMIDEX) 1 MG tablet Take 1 tablet (1 mg total) by mouth daily. 90 tablet 4   acetaminophen (TYLENOL) 325 MG tablet      ALPRAZolam (XANAX) 0.5 MG tablet TAKE 1 TABLET BY MOUTH EVERY DAY AS NEEDED FOR ANXIETY  3   amLODipine (NORVASC) 5 MG tablet Take 5 mg by mouth daily.       aspirin 81 MG EC tablet TAKE ONE (1) TABLET EACH DAY  3   bumetanide (BUMEX) 0.5 MG tablet Take 0.5 mg by mouth daily.       diclofenac sodium (VOLTAREN) 1 % GEL Apply 2 g topically 4 (four) times daily. 3 Tube 2   hydroquinone 4 % cream AS DIRECTED TO AFFECTED AREA TWICE A DAY AS NEEDED EXTERNALLY     levothyroxine (SYNTHROID, LEVOTHROID) 88 MCG tablet      losartan (COZAAR) 100 MG tablet  naproxen sodium (ALEVE) 220 MG tablet      rosuvastatin (CRESTOR) 5 MG tablet Take 5 mg by mouth daily.     triamcinolone cream (KENALOG) 0.5 % APPLY TO AFFECTED AREA OF LEG TWICE A DAY AS NEEDED  3   No current facility-administered medications for this visit.    OBJECTIVE: African-American woman in no acute distress  Vitals:   07/07/21 1559  BP: (!) 154/70  Pulse: 81  Resp: 18  Temp: 97.7 F (36.5 C)  SpO2: 100%     Body mass index is 37.18 kg/m.   Wt Readings from Last 3 Encounters:  07/07/21 216 lb 9.6 oz (98.2 kg)  06/15/21 213 lb 13.5 oz (97 kg)  05/26/21 213 lb 12.8 oz (97 kg)      ECOG FS:1 - Symptomatic but completely ambulatory  Sclerae unicteric, EOMs intact Wearing a mask No cervical or supraclavicular adenopathy Lungs no rales or rhonchi Heart regular rate  and rhythm Abd soft, nontender, positive bowel sounds MSK no focal spinal tenderness, no upper extremity lymphedema Neuro: nonfocal, well oriented, appropriate affect Breasts: The right breast is unremarkable.  The left breast is status post recent lumpectomy.  The cosmetic result is excellent.  There is no evidence of residual or recurrent disease.  Both axillae are benign.   LAB RESULTS:  CMP     Component Value Date/Time   NA 139 05/26/2021 0854   K 3.9 05/26/2021 0854   CL 105 05/26/2021 0854   CO2 26 05/26/2021 0854   GLUCOSE 110 (H) 05/26/2021 0854   BUN 18 05/26/2021 0854   CREATININE 0.89 05/26/2021 0854   CALCIUM 9.4 05/26/2021 0854   PROT 7.4 05/26/2021 0854   ALBUMIN 3.6 05/26/2021 0854   AST 14 (L) 05/26/2021 0854   ALT 8 05/26/2021 0854   ALKPHOS 66 05/26/2021 0854   BILITOT 0.4 05/26/2021 0854   GFRNONAA >60 05/26/2021 0854    No results found for: TOTALPROTELP, ALBUMINELP, A1GS, A2GS, BETS, BETA2SER, GAMS, MSPIKE, SPEI  Lab Results  Component Value Date   WBC 4.2 05/26/2021   NEUTROABS 2.2 05/26/2021   HGB 12.2 05/26/2021   HCT 37.1 05/26/2021   MCV 88.8 05/26/2021   PLT 206 05/26/2021    No results found for: LABCA2  No components found for: RSWNIO270  No results for input(s): INR in the last 168 hours.  No results found for: LABCA2  No results found for: JJK093  No results found for: GHW299  No results found for: BZJ696  No results found for: CA2729  No components found for: HGQUANT  No results found for: CEA1 / No results found for: CEA1   No results found for: AFPTUMOR  No results found for: CHROMOGRNA  No results found for: KPAFRELGTCHN, LAMBDASER, KAPLAMBRATIO (kappa/lambda light chains)  No results found for: HGBA, HGBA2QUANT, HGBFQUANT, HGBSQUAN (Hemoglobinopathy evaluation)   Lab Results  Component Value Date   LDH 167 09/13/2010    No results found for: IRON, TIBC, IRONPCTSAT (Iron and TIBC)  No results found for:  FERRITIN  Urinalysis No results found for: COLORURINE, APPEARANCEUR, LABSPEC, PHURINE, GLUCOSEU, HGBUR, BILIRUBINUR, KETONESUR, PROTEINUR, UROBILINOGEN, NITRITE, LEUKOCYTESUR   STUDIES: No results found.   ELIGIBLE FOR AVAILABLE RESEARCH PROTOCOL: no  ASSESSMENT: 87 y.o. [place] woman status post left breast upper inner quadrant biopsy 05/18/2021 for a clinical T1b N0, stage IA invasive ductal carcinoma, triple positive, with an MIB-1 of 15%.  (1) genetics testing (A) Negative genetic testing:  No pathogenic variants detected on  the [employer] CancerNext-Expanded + RNAinsight panel. Three variants of uncertain significance (VUS) were detected - one in the ATM gene called p.F1265L (c.3793T>C), a second in the LTZR1 gene called c.2414delA (E.Z747FTN*5), and a third in the [place] gene called p.P58L (c.173C>T). The report date is 06/18/2021.   The CancerNext-Expanded + RNAinsight gene panel offered by [employer] and includes sequencing and rearrangement analysis for the following 77 genes: AIP, ALK, APC, ATM, AXIN2, BAP1, BARD1, BLM, BMPR1A, BRCA1, BRCA2, BRIP1, CDC73, CDH1, CDK4, CDKN1B, CDKN2A, CHEK2, CTNNA1, DICER1, FANCC, FH, FLCN, GALNT12, KIF1B, LZTR1, MAX, MEN1, MET, MLH1, MSH2, MSH3, MSH6, MUTYH, NBN, NF1, NF2, NTHL1, PALB2, PHOX2B, PMS2, POT1, PRKAR1A, PTCH1, PTEN, RAD51C, RAD51D, RB1, RECQL, RET, SDHA, SDHAF2, SDHB, SDHC, SDHD, SMAD4, SMARCA4, SMARCB1, SMARCE1, STK11, SUFU, TMEM127, TP53, TSC1, TSC2, VHL and XRCC2 (sequencing and deletion/duplication); EGFR, EGLN1, HOXB13, KIT, MITF, PDGFRA, POLD1 and POLE (sequencing only); EPCAM and GREM1 (deletion/duplication only). RNA data is routinely analyzed for use in variant interpretation for all genes.   (2) status post left lumpectomy 06/15/2021 for a pT1c pNX invasive ductal carcinoma grade 2, with negative margins  (3) trastuzumab (Hylecta) every 3 weeks for 6 months started  (A) echo 06/07/2021 shows EF of 60%  (4) anastrozole started  07/13/2019   PLAN: Shaketa is done with local treatment for her current breast cancer.  She is now ready to discuss systemic therapy.  Given her age we are doing the minimum necessary to optimize the chance of cure.  We know that antiestrogens alone in HER2 positive cases are frequently ineffective because there is crosstalk between the 2 receptors.  Accordingly in addition to anastrozole she will receive trastuzumab and we will do it for 6  months again given the patient's age and admittedly inconclusive data that in many cases 6 months may be as effective as 12  We discussed the possible toxicities side effects and complications of each of these agents including concerns regarding weakening of the heart muscle with trastuzumab.  For that reason after her first 2 doses I am going to repeat an echo.  She also is aware of the concern regarding bone density with anastrozole.  She will have her first treatment on 07/14/2021.  I will see her with her second treatment to make sure she is tolerating the medications well.  She will be set up for her next echo at that time.  Total encounter time 35 minutes.Sarajane Jews C. Riyan Haile, MD 07/07/2021 5:27 PM Medical Oncology and Hematology [facility] [place], [place] [address] Tel. [phone]    Fax. [phone]   This document serves as a record of services personally performed by Lurline Del, MD. It was created on his behalf by Wilburn Mylar, a trained medical scribe. The creation of this record is based on the scribe's personal observations and the provider's statements to them.   I, Lurline Del MD, have reviewed the above documentation for accuracy and completeness, and I agree with the above.   *Total Encounter Time as defined by the Centers for Medicare and Medicaid Services includes, in addition to the face-to-face time of a patient visit (documented in the note above) non-face-to-face time: obtaining and  reviewing outside history, ordering and reviewing medications, tests or procedures, care coordination (communications with other health care professionals or caregivers) and documentation in the medical record.

## 2021-07-08 ENCOUNTER — Encounter: Payer: Self-pay | Admitting: *Deleted

## 2021-07-08 ENCOUNTER — Telehealth: Payer: Self-pay | Admitting: Oncology

## 2021-07-08 DIAGNOSIS — E039 Hypothyroidism, unspecified: Secondary | ICD-10-CM | POA: Diagnosis not present

## 2021-07-08 DIAGNOSIS — E7800 Pure hypercholesterolemia, unspecified: Secondary | ICD-10-CM | POA: Diagnosis not present

## 2021-07-08 DIAGNOSIS — I10 Essential (primary) hypertension: Secondary | ICD-10-CM | POA: Diagnosis not present

## 2021-07-08 NOTE — Telephone Encounter (Signed)
Scheduled appointment per 09/07 los. Patient is aware. 

## 2021-07-08 NOTE — Progress Notes (Signed)
Pharmacist Chemotherapy Monitoring - Initial Assessment    Anticipated start date: 07/15/21   The following has been reviewed per standard work regarding the patient's treatment regimen: The patient's diagnosis, treatment plan and drug doses, and organ/hematologic function Lab orders and baseline tests specific to treatment regimen  The treatment plan start date, drug sequencing, and pre-medications Prior authorization status  Patient's documented medication list, including drug-drug interaction screen and prescriptions for anti-emetics and supportive care specific to the treatment regimen The drug concentrations, fluid compatibility, administration routes, and timing of the medications to be used The patient's access for treatment and lifetime cumulative dose history, if applicable  The patient's medication allergies and previous infusion related reactions, if applicable   Changes made to treatment plan:  N/A  Follow up needed:  Pending authorization for treatment    Judge Stall, [place], 07/08/2021  3:50 PM

## 2021-07-12 ENCOUNTER — Encounter: Payer: Self-pay | Admitting: *Deleted

## 2021-07-12 NOTE — Progress Notes (Signed)
The following biosimilar Kanjinti (trastuzumab-anns) has been selected for use in this patient.  Kennith Center, Pharm.D., CPP 07/12/2021'@12'$ :56 PM

## 2021-07-15 ENCOUNTER — Other Ambulatory Visit: Payer: Self-pay | Admitting: Oncology

## 2021-07-15 ENCOUNTER — Inpatient Hospital Stay: Payer: Medicare Other

## 2021-07-15 ENCOUNTER — Encounter: Payer: Self-pay | Admitting: Oncology

## 2021-07-15 ENCOUNTER — Encounter: Payer: Self-pay | Admitting: *Deleted

## 2021-07-15 ENCOUNTER — Other Ambulatory Visit: Payer: Self-pay

## 2021-07-15 VITALS — BP 142/72 | HR 76 | Temp 97.9°F | Resp 18 | Wt 215.8 lb

## 2021-07-15 DIAGNOSIS — Z170 Estrogen receptor positive status [ER+]: Secondary | ICD-10-CM | POA: Diagnosis not present

## 2021-07-15 DIAGNOSIS — C50212 Malignant neoplasm of upper-inner quadrant of left female breast: Secondary | ICD-10-CM

## 2021-07-15 DIAGNOSIS — Z803 Family history of malignant neoplasm of breast: Secondary | ICD-10-CM | POA: Diagnosis not present

## 2021-07-15 DIAGNOSIS — Z5112 Encounter for antineoplastic immunotherapy: Secondary | ICD-10-CM | POA: Diagnosis not present

## 2021-07-15 DIAGNOSIS — Z808 Family history of malignant neoplasm of other organs or systems: Secondary | ICD-10-CM | POA: Diagnosis not present

## 2021-07-15 LAB — CBC WITH DIFFERENTIAL/PLATELET
Abs Immature Granulocytes: 0.01 10*3/uL (ref 0.00–0.07)
Basophils Absolute: 0 10*3/uL (ref 0.0–0.1)
Basophils Relative: 1 %
Eosinophils Absolute: 0.1 10*3/uL (ref 0.0–0.5)
Eosinophils Relative: 3 %
HCT: 36.3 % (ref 36.0–46.0)
Hemoglobin: 11.9 g/dL — ABNORMAL LOW (ref 12.0–15.0)
Immature Granulocytes: 0 %
Lymphocytes Relative: 31 %
Lymphs Abs: 1.4 10*3/uL (ref 0.7–4.0)
MCH: 28.7 pg (ref 26.0–34.0)
MCHC: 32.8 g/dL (ref 30.0–36.0)
MCV: 87.5 fL (ref 80.0–100.0)
Monocytes Absolute: 0.6 10*3/uL (ref 0.1–1.0)
Monocytes Relative: 13 %
Neutro Abs: 2.3 10*3/uL (ref 1.7–7.7)
Neutrophils Relative %: 52 %
Platelets: 241 10*3/uL (ref 150–400)
RBC: 4.15 MIL/uL (ref 3.87–5.11)
RDW: 14.3 % (ref 11.5–15.5)
WBC: 4.5 10*3/uL (ref 4.0–10.5)
nRBC: 0 % (ref 0.0–0.2)

## 2021-07-15 LAB — COMPREHENSIVE METABOLIC PANEL
ALT: 11 U/L (ref 0–44)
AST: 14 U/L — ABNORMAL LOW (ref 15–41)
Albumin: 3.7 g/dL (ref 3.5–5.0)
Alkaline Phosphatase: 79 U/L (ref 38–126)
Anion gap: 9 (ref 5–15)
BUN: 20 mg/dL (ref 8–23)
CO2: 25 mmol/L (ref 22–32)
Calcium: 9.7 mg/dL (ref 8.9–10.3)
Chloride: 106 mmol/L (ref 98–111)
Creatinine, Ser: 0.99 mg/dL (ref 0.44–1.00)
GFR, Estimated: 56 mL/min — ABNORMAL LOW (ref >60)
Glucose, Bld: 132 mg/dL — ABNORMAL HIGH (ref 70–99)
Potassium: 3.7 mmol/L (ref 3.5–5.1)
Sodium: 140 mmol/L (ref 135–145)
Total Bilirubin: 0.3 mg/dL (ref 0.3–1.2)
Total Protein: 7.3 g/dL (ref 6.5–8.1)

## 2021-07-15 MED ORDER — TRASTUZUMAB-ANNS CHEMO 150 MG IV SOLR
750.0000 mg | Freq: Once | INTRAVENOUS | Status: AC
  Administered 2021-07-15: 750 mg via INTRAVENOUS
  Filled 2021-07-15: qty 35.72

## 2021-07-15 MED ORDER — SODIUM CHLORIDE 0.9 % IV SOLN: Freq: Once | INTRAVENOUS | Status: AC

## 2021-07-15 MED ORDER — DIPHENHYDRAMINE HCL 25 MG PO CAPS
25.0000 mg | ORAL_CAPSULE | Freq: Once | ORAL | Status: AC
  Administered 2021-07-15: 25 mg via ORAL
  Filled 2021-07-15: qty 1

## 2021-07-15 MED ORDER — ACETAMINOPHEN 325 MG PO TABS
650.0000 mg | ORAL_TABLET | Freq: Once | ORAL | Status: AC
  Administered 2021-07-15: 650 mg via ORAL
  Filled 2021-07-15: qty 2

## 2021-07-15 NOTE — Patient Instructions (Signed)
[facility]ICAL [facility]COL[facility]  Discharge Instructions: Thank you for choosing [facility] to provide your oncology and hematology care.   If you have a lab appointment with the Cancer Center, please go directly to the Cancer Center and check in at the registration area.   Wear comfortable clothing and clothing appropriate for easy access to any Portacath or PICC line.   We strive to give you quality time with your provider. You may need to reschedule your appointment if you arrive late (15 or more minutes).  Arriving late affects you and other patients whose appointments are after yours.  Also, if you miss three or more appointments without notifying the office, you may be dismissed from the clinic at the provider's discretion.      For prescription refill requests, have your pharmacy contact our office and allow 72 hours for refills to be completed.    Today you received the following chemotherapy and/or immunotherapy agents Kanjinti      To help prevent nausea and vomiting after your treatment, we encourage you to take your nausea medication as directed.  BELOW ARE SYMPTOMS THAT SHOULD BE REPORTED IMMEDIATELY: *FEVER GREATER THAN 100.4 F (38 C) OR HIGHER *CHILLS OR SWEATING *NAUSEA AND VOMITING THAT IS NOT CONTROLLED WITH YOUR NAUSEA MEDICATION *UNUSUAL SHORTNESS OF BREATH *UNUSUAL BRUISING OR BLEEDING *URINARY PROBLEMS (pain or burning when urinating, or frequent urination) *BOWEL PROBLEMS (unusual diarrhea, constipation, pain near the anus) TENDERNESS IN MOUTH AND THROAT WITH OR WITHOUT PRESENCE OF ULCERS (sore throat, sores in mouth, or a toothache) UNUSUAL RASH, SWELLING OR PAIN  UNUSUAL VAGINAL DISCHARGE OR ITCHING   Items with * indicate a potential emergency and should be followed up as soon as possible or go to the Emergency Department if any problems should occur.  Please show the CHEMOTHERAPY ALERT CARD or IMMUNOTHERAPY ALERT CARD at check-in to  the Emergency Department and triage nurse.  Should you have questions after your visit or need to cancel or reschedule your appointment, please contact [facility]  Dept: [phone]  and follow the prompts.  Office hours are 8:00 a.m. to 4:30 p.m. Monday - Friday. Please note that voicemails left after 4:00 p.m. may not be returned until the following business day.  We are closed weekends and major holidays. You have access to a nurse at all times for urgent questions. Please call the main number to the clinic Dept: [phone] and follow the prompts.   For any non-urgent questions, you may also contact your provider using MyChart. We now offer e-Visits for anyone 18 and older to request care online for non-urgent symptoms. For details visit [email or web address].   Also download the MyChart app! Go to the app store, search "MyChart", open the app, select [facility], and log in with your MyChart username and password.  Due to Covid, a mask is required upon entering the hospital/clinic. If you do not have a mask, one will be given to you upon arrival. For doctor visits, patients may have 1 support person aged 18 or older with them. For treatment visits, patients cannot have anyone with them due to current Covid guidelines and our immunocompromised population.   Trastuzumab injection for infusion What is this medication? TRASTUZUMAB (tras TOO zoo mab) is a monoclonal antibody. It is used to treat breast cancer and stomach cancer. This medicine may be used for other purposes; ask your health care provider or pharmacist if you have questions. COMMON BRAND NAME(S):   Herceptin, Herzuma, KANJINTI, Ogivri, Ontruzant, Trazimera What should I tell my care team before I take this medication? They need to know if you have any of these conditions: heart disease heart failure lung or breathing disease, like asthma an unusual or allergic reaction to trastuzumab, benzyl  alcohol, or other medications, foods, dyes, or preservatives pregnant or trying to get pregnant breast-feeding How should I use this medication? This drug is given as an infusion into a vein. It is administered in a hospital or clinic by a specially trained health care professional. Talk to your pediatrician regarding the use of this medicine in children. This medicine is not approved for use in children. Overdosage: If you think you have taken too much of this medicine contact a poison control center or emergency room at once. NOTE: This medicine is only for you. Do not share this medicine with others. What if I miss a dose? It is important not to miss a dose. Call your doctor or health care professional if you are unable to keep an appointment. What may interact with this medication? This medicine may interact with the following medications: certain types of chemotherapy, such as daunorubicin, doxorubicin, epirubicin, and idarubicin This list may not describe all possible interactions. Give your health care provider a list of all the medicines, herbs, non-prescription drugs, or dietary supplements you use. Also tell them if you smoke, drink alcohol, or use illegal drugs. Some items may interact with your medicine. What should I watch for while using this medication? Visit your doctor for checks on your progress. Report any side effects. Continue your course of treatment even though you feel ill unless your doctor tells you to stop. Call your doctor or health care professional for advice if you get a fever, chills or sore throat, or other symptoms of a cold or flu. Do not treat yourself. Try to avoid being around people who are sick. You may experience fever, chills and shaking during your first infusion. These effects are usually mild and can be treated with other medicines. Report any side effects during the infusion to your health care professional. Fever and chills usually do not happen with  later infusions. Do not become pregnant while taking this medicine or for 7 months after stopping it. Women should inform their doctor if they wish to become pregnant or think they might be pregnant. Women of child-bearing potential will need to have a negative pregnancy test before starting this medicine. There is a potential for serious side effects to an unborn child. Talk to your health care professional or pharmacist for more information. Do not breast-feed an infant while taking this medicine or for 7 months after stopping it. Women must use effective birth control with this medicine. What side effects may I notice from receiving this medication? Side effects that you should report to your doctor or health care professional as soon as possible: allergic reactions like skin rash, itching or hives, swelling of the face, lips, or tongue chest pain or palpitations cough dizziness feeling faint or lightheaded, falls fever general ill feeling or flu-like symptoms signs of worsening heart failure like breathing problems; swelling in your legs and feet unusually weak or tired Side effects that usually do not require medical attention (report to your doctor or health care professional if they continue or are bothersome): bone pain changes in taste diarrhea joint pain nausea/vomiting weight loss This list may not describe all possible side effects. Call your doctor for medical advice about side effects.   You may report side effects to FDA at 1-800-FDA-1088. Where should I keep my medication? This drug is given in a hospital or clinic and will not be stored at home. NOTE: This sheet is a summary. It may not cover all possible information. If you have questions about this medicine, talk to your doctor, pharmacist, or health care provider.  2022 Elsevier/Gold Standard (2016-10-11 14:37:52)   

## 2021-07-15 NOTE — Progress Notes (Signed)
Called pt to introduce myself as her Arboriculturist.  Pt has 2 insurances so copay assistance shouldn't be needed.  I informed her of the [employer] and went over what it covers but pt declined the grant at this time.  I will request the registration staff give her my card in case she changes her mind and for any questions or concerns she may have in the future.

## 2021-07-26 DIAGNOSIS — R197 Diarrhea, unspecified: Secondary | ICD-10-CM | POA: Diagnosis not present

## 2021-07-26 DIAGNOSIS — M65331 Trigger finger, right middle finger: Secondary | ICD-10-CM | POA: Diagnosis not present

## 2021-07-26 DIAGNOSIS — C50912 Malignant neoplasm of unspecified site of left female breast: Secondary | ICD-10-CM | POA: Diagnosis not present

## 2021-07-26 DIAGNOSIS — E039 Hypothyroidism, unspecified: Secondary | ICD-10-CM | POA: Diagnosis not present

## 2021-07-26 DIAGNOSIS — I10 Essential (primary) hypertension: Secondary | ICD-10-CM | POA: Diagnosis not present

## 2021-07-26 DIAGNOSIS — E7800 Pure hypercholesterolemia, unspecified: Secondary | ICD-10-CM | POA: Diagnosis not present

## 2021-07-26 DIAGNOSIS — Z23 Encounter for immunization: Secondary | ICD-10-CM | POA: Diagnosis not present

## 2021-08-03 NOTE — Progress Notes (Signed)
[place]  [phone] [phone]     ID: BLANCH STANG DOB: Dec 28, 1932  MR#: [id number]  JYN#:[id number]  Patient Care Team: Seward Carol, MD as PCP - General (Internal Medicine) Mauro Kaufmann, RN as Oncology Nurse Navigator Rockwell Germany, RN as Oncology Nurse Navigator Rolm Bookbinder, MD as Consulting Physician (General Surgery) Breindel Collier, Virgie Dad, MD as Consulting Physician (Oncology) Eppie Gibson, MD as Attending Physician (Radiation Oncology) Garald Balding, MD as Consulting Physician (Orthopedic Surgery) Shon Hough, MD as Consulting Physician (Ophthalmology) Bensimhon, Shaune Pascal, MD as Consulting Physician (Cardiology) Chauncey Cruel, MD OTHER MD:  CHIEF COMPLAINT: Triple positive breast cancer  CURRENT TREATMENT: Anastrozole, trastuzumab/[Hylecta?]   INTERVAL HISTORY: Berdena returns today for follow up of her triple positive breast cancer.   She started anastrozole at her last visit on 85/04/2021.  She is tolerating this well.  She had hot flashes at baseline and they have not worsened.  Vaginal dryness is not an issue.  She does take it at night and she has noted a slight cough sometimes at night.  This is likely unrelated.  She also started trastuzumab on 07/15/2021.  She receives this peripherally and does not have a port.  Sometimes they have to stick her up to 4 times to get a half decent vein.  We have put her in for high Elekta but the insurance has so far refused.  I really think they need to reconsider as this is causing significant pain and discomfort to the patient to the point that she will need a port if we cannot get the subcutaneous Herceptin approved 85   REVIEW OF SYSTEMS: Shital has some low back pain which is intermittent and improves with activity.  A detailed review of systems today was otherwise stable   COVID 19 VACCINATION STATUS: Status post Pfizer x3 as of July 2022   HISTORY OF CURRENT  ILLNESS: From the original intake note:  KRYSSA RISENHOOVER has a history of prior left breast cancer. She underwent left lumpectomy with node dissection in 07/1986 and received radiation therapy followed by tamoxifen for 5 years.  More recently, she had routine screening mammography on 04/27/2021 showing a possible abnormality in the left breast. She underwent left diagnostic mammography with tomography and left breast ultrasonography at [facility] on 05/12/2021 showing: breast density category B; 0.8 cm lobulated mass in left breast at 9-10 o'clock; no significant left axilla abnormalities.  Accordingly on 05/18/2021 she proceeded to biopsy of the left breast area in question. The pathology from this procedure ([id number]) showed: invasive ductal carcinoma, grade 2; ductal carcinoma in situ. Prognostic indicators significant for: estrogen receptor, 100% positive and progesterone receptor, 2% positive, both with strong staining intensity. Proliferation marker Ki67 at 15%. HER2 equivocal by immunohistochemistry (2+), but positive by fluorescent in situ hybridization with a signals ratio 2 and number per cell 4.15.  Cancer Staging Malignant neoplasm of upper-inner quadrant of left breast in female, estrogen receptor positive ([place]) Staging form: Breast, AJCC 8th Edition - Clinical stage from 05/26/2021: Stage IA (cT1b, cN0, cM0, G2, ER+, PR+, HER2+) - Signed by Chauncey Cruel, MD on 05/26/2021 Stage prefix: Initial diagnosis Histologic grading system: 3 grade system Laterality: Left Staged by: Pathologist and managing physician Stage used in treatment planning: Yes National guidelines used in treatment planning: Yes Type of national guideline used in treatment planning: NCCN  The patient's subsequent history is as detailed below.   PAST MEDICAL HISTORY: Past Medical History:  Diagnosis Date  Anxiety    self reported   Breast cancer ([place])    Cataract    Family history of bone cancer    Family  history of breast cancer    Hypertension     PAST SURGICAL HISTORY: Past Surgical History:  Procedure Laterality Date   ABDOMINAL HYSTERECTOMY     BREAST LUMPECTOMY  1988   BREAST LUMPECTOMY WITH RADIOACTIVE SEED LOCALIZATION Left 06/15/2021   Procedure: LEFT BREAST LUMPECTOMY WITH RADIOACTIVE SEED LOCALIZATION;  Surgeon: Rolm Bookbinder, MD;  Location: [facility];  Service: General;  Laterality: Left;   INTRAOCULAR PROSTHESES INSERTION  8/248/2005    FAMILY HISTORY: Family History  Problem Relation Age of Onset   Breast cancer Maternal Aunt        x5 aunts, dx >50   Breast cancer Paternal Aunt        dx >50   Breast cancer Cousin        x4 maternal first cousins   Bone cancer Daughter 85   Her father died at age 31 from CVA. Her mother died at age 24. Shefali had two brothers. She reports breast cancer in five maternal aunts and one paternal aunt. Aside from those aunts, there is no additional family history of cancer to her knowledge.   GYNECOLOGIC HISTORY:  No LMP recorded. Patient has had a hysterectomy. Menarche: 87 years old Age at first live birth: 87 years old [place] P 2 LMP 1977, with hysterectomy Contraceptive: used for 5 years without issue HRT used for 5 years  Hysterectomy? Yes, 1977 BSO?  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 Husband [name] is retired from working as a Environmental consultant for [employer]. Son Philbert Brooke Bonito "Fraser Din," age 49, is a Corporate investment banker here in [employer] [place]. Daughter [name] died at age 55 on 2016/04/21 shortly after being diagnosed with cancer.    ADVANCED DIRECTIVES: in place   HEALTH MAINTENANCE: Social History   Tobacco Use   Smoking status: Never   Smokeless tobacco: Never  Vaping Use   Vaping Use: Never used  Substance Use Topics   Alcohol use: Yes    Comment: rarely, social   Drug use: No     Colonoscopy:  never done  PAP: 2014  Bone density: 03/2021   No Known Allergies  Current Outpatient Medications  Medication Sig Dispense Refill   acetaminophen (TYLENOL) 325 MG tablet      ALPRAZolam (XANAX) 0.5 MG tablet TAKE 1 TABLET BY MOUTH EVERY DAY AS NEEDED FOR ANXIETY  3   amLODipine (NORVASC) 5 MG tablet Take 5 mg by mouth daily.       anastrozole (ARIMIDEX) 1 MG tablet Take 1 tablet (1 mg total) by mouth daily. 90 tablet 4   aspirin 81 MG EC tablet TAKE ONE (1) TABLET EACH DAY  3   bumetanide (BUMEX) 0.5 MG tablet Take 0.5 mg by mouth daily.       hydroquinone 4 % cream AS DIRECTED TO AFFECTED AREA TWICE A DAY AS NEEDED EXTERNALLY     levothyroxine (SYNTHROID, LEVOTHROID) 88 MCG tablet      losartan (COZAAR) 100 MG tablet      naproxen sodium (ALEVE) 220 MG tablet      rosuvastatin (CRESTOR) 5 MG tablet Take 5 mg by mouth daily.     triamcinolone cream (KENALOG) 0.5 % APPLY TO AFFECTED AREA OF LEG TWICE A DAY AS NEEDED  3   No current facility-administered medications for this visit.   Facility-Administered Medications Ordered in Other Visits  Medication Dose Route Frequency Provider Last Rate Last Admin   heparin lock flush 100 unit/mL  500 Units Intracatheter Once PRN Quinnlyn Hearns, Virgie Dad, MD       sodium chloride flush (NS) 0.9 % injection 10 mL  10 mL Intracatheter PRN Tyree Fluharty, Virgie Dad, MD        OBJECTIVE: African-American woman who appears stated age  Vitals:   08/04/21 1200  BP: (!) 152/75  Pulse: 72  Resp: 18  Temp: (!) 97.5 F (36.4 C)  SpO2: 100%     Body mass index is 37.15 kg/m.   Wt Readings from Last 3 Encounters:  08/04/21 216 lb 7 oz (98.2 kg)  07/15/21 215 lb 12 oz (97.9 kg)  07/07/21 216 lb 9.6 oz (98.2 kg)      ECOG FS:1 - Symptomatic but completely ambulatory  Sclerae unicteric, EOMs intact Wearing a mask No cervical or supraclavicular adenopathy Lungs no rales or rhonchi Heart regular rate and rhythm Abd soft, nontender, positive bowel sounds MSK  no focal spinal tenderness, no upper extremity lymphedema Neuro: nonfocal, well oriented, appropriate affect Breasts: The right breast is benign.  The left breast is status postlumpectomy with no evidence of disease recurrence.  Both axillae are benign. Skin: Bruising in the right wrist area and right forearm where phlebotomy was recently attempted.   LAB RESULTS:  CMP     Component Value Date/Time   NA 143 08/04/2021 1108   K 3.8 08/04/2021 1108   CL 107 08/04/2021 1108   CO2 25 08/04/2021 1108   GLUCOSE 107 (H) 08/04/2021 1108   BUN 13 08/04/2021 1108   CREATININE 1.00 08/04/2021 1108   CREATININE 0.89 05/26/2021 0854   CALCIUM 9.6 08/04/2021 1108   PROT 7.3 08/04/2021 1108   ALBUMIN 3.6 08/04/2021 1108   AST 17 08/04/2021 1108   AST 14 (L) 05/26/2021 0854   ALT 12 08/04/2021 1108   ALT 8 05/26/2021 0854   ALKPHOS 81 08/04/2021 1108   BILITOT 0.4 08/04/2021 1108   BILITOT 0.4 05/26/2021 0854   GFRNONAA 55 (L) 08/04/2021 1108   GFRNONAA >60 05/26/2021 0854    No results found for: TOTALPROTELP, ALBUMINELP, A1GS, A2GS, BETS, BETA2SER, GAMS, MSPIKE, SPEI  Lab Results  Component Value Date   WBC 4.5 08/04/2021   NEUTROABS 2.3 08/04/2021   HGB 11.6 (L) 08/04/2021   HCT 35.6 (L) 08/04/2021   MCV 88.6 08/04/2021   PLT 221 08/04/2021    No results found for: LABCA2  No components found for: RXVQMG867  No results for input(s): INR in the last 168 hours.  No results found for: LABCA2  No results found for: YPP509  No results found for: TOI712  No results found for: WPY099  No results found for: CA2729  No components found for: HGQUANT  No results found for: CEA1 / No results found for: CEA1   No results found for: AFPTUMOR  No results found for: CHROMOGRNA  No results found for: KPAFRELGTCHN, LAMBDASER, KAPLAMBRATIO (kappa/lambda light chains)  No results found for: HGBA, HGBA2QUANT, HGBFQUANT, HGBSQUAN (Hemoglobinopathy evaluation)   Lab Results   Component Value Date   LDH 167 09/13/2010    No results found for: IRON, TIBC, IRONPCTSAT (Iron and TIBC)  No results found for: FERRITIN  Urinalysis No results found for: COLORURINE, APPEARANCEUR, LABSPEC, PHURINE, GLUCOSEU, HGBUR, BILIRUBINUR, KETONESUR, PROTEINUR, UROBILINOGEN, NITRITE, LEUKOCYTESUR   STUDIES:  No results found.   ELIGIBLE FOR AVAILABLE RESEARCH PROTOCOL: no  ASSESSMENT: 87 y.o. [place] woman status post left breast upper inner quadrant biopsy 05/18/2021 for a clinical T1b N0, stage IA invasive ductal carcinoma, triple positive, with an MIB-1 of 15%.  (1) genetics testing 06/18/2021 through the he [facility]-Expanded + RNAinsight panel found no deleterious mutations in AIP, ALK, APC, ATM, AXIN2, BAP1, BARD1, BLM, BMPR1A, BRCA1, BRCA2, BRIP1, CDC73, CDH1, CDK4, CDKN1B, CDKN2A, CHEK2, CTNNA1, DICER1, FANCC, FH, FLCN, GALNT12, KIF1B, LZTR1, MAX, MEN1, MET, MLH1, MSH2, MSH3, MSH6, MUTYH, NBN, NF1, NF2, NTHL1, PALB2, PHOX2B, PMS2, POT1, PRKAR1A, PTCH1, PTEN, RAD51C, RAD51D, RB1, RECQL, RET, SDHA, SDHAF2, SDHB, SDHC, SDHD, SMAD4, SMARCA4, SMARCB1, SMARCE1, STK11, SUFU, TMEM127, TP53, TSC1, TSC2, VHL and XRCC2 (sequencing and deletion/duplication); EGFR, EGLN1, HOXB13, KIT, MITF, PDGFRA, POLD1 and POLE (sequencing only); EPCAM and GREM1 (deletion/duplication only). RNA data is routinely analyzed for use in variant interpretation for all genes (A)  Three variants of uncertain significance (VUS) were detected - one in the ATM gene called p.F1265L (c.3793T>C), a second in the LTZR1 gene called c.2414delA (B.S496PRF*1), and a third in the [place] gene called p.P58L (c.173C>T).  (2) status post left lumpectomy 06/15/2021 for a pT1c pNX invasive ductal carcinoma grade 2, with negative margins  (3) trastuzumab every 3 weeks for 6 months started 07/15/2021  (A) echo 06/07/2021 shows EF of 60%  (4) anastrozole started 07/13/2019   PLAN: Pansy is is now 2 months out from  definitive surgery for her breast cancer.  The incision has healed well and she is having no local issues regarding her tumor.  She is tolerating anastrozole generally well and the plan will be to continue that a total of 5 years.  She is tolerating Herceptin itself well but axis is a real problem.  I am asking the insurance to reconsider considering how much difficulty she is having getting her treatments and how much pain she is enduring because of repeated access attempts.  If they do not agree and failed to take this into account so that we cannot give her the subcu form she will have to undergo surgery as we will have to place a port.  Total encounter time 35 minutes.Sarajane Jews C. Noelia Lenart, MD 08/04/2021 3:11 PM Medical Oncology and Hematology [facility] [place], [place] [address] Tel. [phone]    Fax. [phone]   This document serves as a record of services personally performed by Lurline Del, MD. It was created on his behalf by Wilburn Mylar, a trained medical scribe. The creation of this record is based on the scribe's personal observations and the provider's statements to them.   I, Lurline Del MD, have reviewed the above documentation for accuracy and completeness, and I agree with the above.   *Total Encounter Time as defined by the Centers for Medicare and Medicaid Services includes, in addition to the face-to-face time of a patient visit (documented in the note above) non-face-to-face time: obtaining and reviewing outside history, ordering and reviewing medications, tests or procedures, care coordination (communications with other health care professionals or caregivers) and documentation in the medical record.

## 2021-08-04 ENCOUNTER — Inpatient Hospital Stay (HOSPITAL_BASED_OUTPATIENT_CLINIC_OR_DEPARTMENT_OTHER): Payer: Medicare Other | Admitting: Oncology

## 2021-08-04 ENCOUNTER — Inpatient Hospital Stay: Payer: Medicare Other

## 2021-08-04 ENCOUNTER — Inpatient Hospital Stay: Payer: Medicare Other | Attending: Oncology

## 2021-08-04 ENCOUNTER — Other Ambulatory Visit: Payer: Self-pay

## 2021-08-04 VITALS — BP 152/75 | HR 72 | Temp 97.5°F | Resp 18 | Wt 216.4 lb

## 2021-08-04 DIAGNOSIS — C50212 Malignant neoplasm of upper-inner quadrant of left female breast: Secondary | ICD-10-CM

## 2021-08-04 DIAGNOSIS — Z170 Estrogen receptor positive status [ER+]: Secondary | ICD-10-CM | POA: Insufficient documentation

## 2021-08-04 DIAGNOSIS — Z5112 Encounter for antineoplastic immunotherapy: Secondary | ICD-10-CM | POA: Insufficient documentation

## 2021-08-04 LAB — CBC WITH DIFFERENTIAL/PLATELET
Abs Immature Granulocytes: 0.01 10*3/uL (ref 0.00–0.07)
Basophils Absolute: 0 10*3/uL (ref 0.0–0.1)
Basophils Relative: 1 %
Eosinophils Absolute: 0.3 10*3/uL (ref 0.0–0.5)
Eosinophils Relative: 6 %
HCT: 35.6 % — ABNORMAL LOW (ref 36.0–46.0)
Hemoglobin: 11.6 g/dL — ABNORMAL LOW (ref 12.0–15.0)
Immature Granulocytes: 0 %
Lymphocytes Relative: 31 %
Lymphs Abs: 1.4 10*3/uL (ref 0.7–4.0)
MCH: 28.9 pg (ref 26.0–34.0)
MCHC: 32.6 g/dL (ref 30.0–36.0)
MCV: 88.6 fL (ref 80.0–100.0)
Monocytes Absolute: 0.6 10*3/uL (ref 0.1–1.0)
Monocytes Relative: 12 %
Neutro Abs: 2.3 10*3/uL (ref 1.7–7.7)
Neutrophils Relative %: 50 %
Platelets: 221 10*3/uL (ref 150–400)
RBC: 4.02 MIL/uL (ref 3.87–5.11)
RDW: 14.3 % (ref 11.5–15.5)
WBC: 4.5 10*3/uL (ref 4.0–10.5)
nRBC: 0 % (ref 0.0–0.2)

## 2021-08-04 LAB — COMPREHENSIVE METABOLIC PANEL
ALT: 12 U/L (ref 0–44)
AST: 17 U/L (ref 15–41)
Albumin: 3.6 g/dL (ref 3.5–5.0)
Alkaline Phosphatase: 81 U/L (ref 38–126)
Anion gap: 11 (ref 5–15)
BUN: 13 mg/dL (ref 8–23)
CO2: 25 mmol/L (ref 22–32)
Calcium: 9.6 mg/dL (ref 8.9–10.3)
Chloride: 107 mmol/L (ref 98–111)
Creatinine, Ser: 1 mg/dL (ref 0.44–1.00)
GFR, Estimated: 55 mL/min — ABNORMAL LOW (ref >60)
Glucose, Bld: 107 mg/dL — ABNORMAL HIGH (ref 70–99)
Potassium: 3.8 mmol/L (ref 3.5–5.1)
Sodium: 143 mmol/L (ref 135–145)
Total Bilirubin: 0.4 mg/dL (ref 0.3–1.2)
Total Protein: 7.3 g/dL (ref 6.5–8.1)

## 2021-08-04 MED ORDER — SODIUM CHLORIDE 0.9 % IV SOLN: Freq: Once | INTRAVENOUS | Status: AC

## 2021-08-04 MED ORDER — DIPHENHYDRAMINE HCL 25 MG PO CAPS
25.0000 mg | ORAL_CAPSULE | Freq: Once | ORAL | Status: AC
  Administered 2021-08-04: 25 mg via ORAL
  Filled 2021-08-04: qty 1

## 2021-08-04 MED ORDER — ACETAMINOPHEN 325 MG PO TABS
650.0000 mg | ORAL_TABLET | Freq: Once | ORAL | Status: AC
  Administered 2021-08-04: 650 mg via ORAL
  Filled 2021-08-04: qty 2

## 2021-08-04 MED ORDER — SODIUM CHLORIDE 0.9 % IV SOLN
6.0000 mg/kg | Freq: Once | INTRAVENOUS | Status: AC
  Administered 2021-08-04: 588 mg via INTRAVENOUS
  Filled 2021-08-04: qty 28

## 2021-08-04 MED ORDER — HEPARIN SOD (PORK) LOCK FLUSH 100 UNIT/ML IV SOLN: 500.0000 [IU] | Freq: Once | INTRAVENOUS | Status: DC | PRN

## 2021-08-04 MED ORDER — SODIUM CHLORIDE 0.9% FLUSH: 10.0000 mL | INTRAVENOUS | Status: DC | PRN

## 2021-08-04 NOTE — Patient Instructions (Signed)
[facility] MEDICAL [facility]COLOG[facility]  Discharge Instructions: °Thank you for choosing [facility] to provide your oncology and hematology care.  ° °If you have a lab appointment with the Cancer Center, please go directly to the Cancer Center and check in at the registration area. °  °Wear comfortable clothing and clothing appropriate for easy access to any Portacath or PICC line.  ° °We strive to give you quality time with your provider. You may need to reschedule your appointment if you arrive late (15 or more minutes).  Arriving late affects you and other patients whose appointments are after yours.  Also, if you miss three or more appointments without notifying the office, you may be dismissed from the clinic at the provider’s discretion.    °  °For prescription refill requests, have your pharmacy contact our office and allow 72 hours for refills to be completed.   ° °Today you received the following chemotherapy and/or immunotherapy agents: Kanjinti °  °To help prevent nausea and vomiting after your treatment, we encourage you to take your nausea medication as directed. ° °BELOW ARE SYMPTOMS THAT SHOULD BE REPORTED IMMEDIATELY: °*FEVER GREATER THAN 100.4 F (38 °C) OR HIGHER °*CHILLS OR SWEATING °*NAUSEA AND VOMITING THAT IS NOT CONTROLLED WITH YOUR NAUSEA MEDICATION °*UNUSUAL SHORTNESS OF BREATH °*UNUSUAL BRUISING OR BLEEDING °*URINARY PROBLEMS (pain or burning when urinating, or frequent urination) °*BOWEL PROBLEMS (unusual diarrhea, constipation, pain near the anus) °TENDERNESS IN MOUTH AND THROAT WITH OR WITHOUT PRESENCE OF ULCERS (sore throat, sores in mouth, or a toothache) °UNUSUAL RASH, SWELLING OR PAIN  °UNUSUAL VAGINAL DISCHARGE OR ITCHING  ° °Items with * indicate a potential emergency and should be followed up as soon as possible or go to the Emergency Department if any problems should occur. ° °Please show the CHEMOTHERAPY ALERT CARD or IMMUNOTHERAPY ALERT CARD at check-in to the  Emergency Department and triage nurse. ° °Should you have questions after your visit or need to cancel or reschedule your appointment, please contact [facility]  Dept: [phone]  and follow the prompts.  Office hours are 8:00 a.m. to 4:30 p.m. Monday - Friday. Please note that voicemails left after 4:00 p.m. may not be returned until the following business day.  We are closed weekends and major holidays. You have access to a nurse at all times for urgent questions. Please call the main number to the clinic Dept: [phone] and follow the prompts. ° ° °For any non-urgent questions, you may also contact your provider using MyChart. We now offer e-Visits for anyone 18 and older to request care online for non-urgent symptoms. For details visit [email or web address]. °  °Also download the MyChart app! Go to the app store, search "MyChart", open the app, select [facility], and log in with your MyChart username and password. ° °Due to Covid, a mask is required upon entering the hospital/clinic. If you do not have a mask, one will be given to you upon arrival. For doctor visits, patients may have 1 support person aged 18 or older with them. For treatment visits, patients cannot have anyone with them due to current Covid guidelines and our immunocompromised population.  ° °

## 2021-08-18 DIAGNOSIS — H52203 Unspecified astigmatism, bilateral: Secondary | ICD-10-CM | POA: Diagnosis not present

## 2021-08-18 DIAGNOSIS — H04123 Dry eye syndrome of bilateral lacrimal glands: Secondary | ICD-10-CM | POA: Diagnosis not present

## 2021-08-18 DIAGNOSIS — H43813 Vitreous degeneration, bilateral: Secondary | ICD-10-CM | POA: Diagnosis not present

## 2021-08-24 ENCOUNTER — Ambulatory Visit (INDEPENDENT_AMBULATORY_CARE_PROVIDER_SITE_OTHER): Payer: Medicare Other | Admitting: Orthopaedic Surgery

## 2021-08-24 ENCOUNTER — Encounter: Payer: Self-pay | Admitting: Orthopaedic Surgery

## 2021-08-24 ENCOUNTER — Other Ambulatory Visit: Payer: Self-pay

## 2021-08-24 VITALS — Ht 64.0 in | Wt 216.0 lb

## 2021-08-24 DIAGNOSIS — M170 Bilateral primary osteoarthritis of knee: Secondary | ICD-10-CM | POA: Diagnosis not present

## 2021-08-24 DIAGNOSIS — M65331 Trigger finger, right middle finger: Secondary | ICD-10-CM | POA: Insufficient documentation

## 2021-08-24 MED ORDER — SODIUM HYALURONATE (VISCOSUP) 16.8 MG/2ML IX SOSY
16.8000 mg | PREFILLED_SYRINGE | INTRA_ARTICULAR | Status: AC | PRN
  Administered 2021-08-24: 16.8 mg via INTRA_ARTICULAR

## 2021-08-24 NOTE — Progress Notes (Signed)
Office Visit Note   Patient: Jillian Buchanan           Date of Birth: Aug 02, 1933           MRN: [id number] Visit Date: 08/24/2021              Requested by: Seward Carol, MD [address] [place] [address] [place],  [place] [address] PCP: Seward Carol, MD   Assessment & Plan: Visit Diagnoses:  1. Bilateral primary osteoarthritis of knee   2. Trigger finger, right middle finger     Plan: Jillian Buchanan has been approved for viscosupplementation.  She has advanced osteoarthritis in both knees.  We will proceed with the injections of Lurline Del today and see her back weekly for the next 2 weeks to complete the series.  She also has a right long trigger finger.  We have discussed different treatment options but she would prefer to wait to do anything until after she has had the viscosupplementation  Follow-Up Instructions: Return in about 1 week (around 08/31/2021).   Orders:  No orders of the defined types were placed in this encounter.  No orders of the defined types were placed in this encounter.     Procedures: Large Joint Inj: bilateral knee on 08/24/2021 3:07 PM Indications: pain and joint swelling Details: 25 G 1.5 in needle, anteromedial approach  Arthrogram: No  Medications (Right): 16.8 mg Sodium Hyaluronate (Viscosup) 16.8 MG/2ML Medications (Left): 16.8 mg Sodium Hyaluronate (Viscosup) 16.8 MG/2ML Outcome: tolerated well, no immediate complications Procedure, treatment alternatives, risks and benefits explained, specific risks discussed. Consent was given by the patient. Immediately prior to procedure a time out was called to verify the correct patient, procedure, equipment, support staff and site/side marked as required. Patient was prepped and draped in the usual sterile fashion.      Clinical Data: No additional findings.   Subjective: Chief Complaint  Patient presents with   Left Knee - Pain   Right Knee - Pain  Jillian Buchanan has been approved for  viscosupplementation and is to start [place] injections today.  She is in the midst of chemotherapy for recurrent breast cancer  HPI  Review of Systems   Objective: Vital Signs: Ht 5\' 4"  (1.626 m)   Wt 216 lb (98 kg)   BMI 37.08 kg/m   Physical Exam  Ortho Exam large knees.  The knees were not hot or warm.  I do not think she had an effusion.  Mostly medial joint pain today.  Walks with the use of a cane.  Specialty Comments:  No specialty comments available.  Imaging: No results found.   PMFS History: Patient Active Problem List   Diagnosis Date Noted   Trigger finger, right middle finger 08/24/2021   Genetic testing 06/18/2021   Family history of breast cancer 05/26/2021   Family history of bone cancer 05/26/2021   Malignant neoplasm of upper-inner quadrant of left breast in female, estrogen receptor positive ([place]) 05/24/2021   Bilateral primary osteoarthritis of knee 11/12/2019   Obesity 11/12/2019   Chronic right shoulder pain 11/27/2017   Unilateral primary osteoarthritis, left knee 11/27/2017   Past Medical History:  Diagnosis Date   Anxiety    self reported   Breast cancer ([place])    Cataract    Family history of bone cancer    Family history of breast cancer    Hypertension     Family History  Problem Relation Age of Onset   Breast cancer Maternal Aunt  x5 aunts, dx >50   Breast cancer Paternal Aunt        dx >50   Breast cancer Cousin        x4 maternal first cousins   Bone cancer Daughter 2    Past Surgical History:  Procedure Laterality Date   ABDOMINAL HYSTERECTOMY     BREAST LUMPECTOMY  1988   BREAST LUMPECTOMY WITH RADIOACTIVE SEED LOCALIZATION Left 06/15/2021   Procedure: LEFT BREAST LUMPECTOMY WITH RADIOACTIVE SEED LOCALIZATION;  Surgeon: Rolm Bookbinder, MD;  Location: [facility];  Service: General;  Laterality: Left;   INTRAOCULAR PROSTHESES INSERTION  8/248/2005   Social History   Occupational History   Not  on file  Tobacco Use   Smoking status: Never   Smokeless tobacco: Never  Vaping Use   Vaping Use: Never used  Substance and Sexual Activity   Alcohol use: Yes    Comment: rarely, social   Drug use: No   Sexual activity: Never     Garald Balding, MD   Note - This record has been created using Editor, commissioning.  Chart creation errors have been sought, but may not always  have been located. Such creation errors do not reflect on  the standard of medical care.

## 2021-08-25 ENCOUNTER — Inpatient Hospital Stay: Payer: Medicare Other

## 2021-08-25 VITALS — BP 155/88 | HR 70 | Temp 97.9°F | Resp 18 | Wt 220.2 lb

## 2021-08-25 DIAGNOSIS — Z170 Estrogen receptor positive status [ER+]: Secondary | ICD-10-CM | POA: Diagnosis not present

## 2021-08-25 DIAGNOSIS — C50212 Malignant neoplasm of upper-inner quadrant of left female breast: Secondary | ICD-10-CM | POA: Diagnosis not present

## 2021-08-25 DIAGNOSIS — Z5112 Encounter for antineoplastic immunotherapy: Secondary | ICD-10-CM | POA: Diagnosis not present

## 2021-08-25 LAB — COMPREHENSIVE METABOLIC PANEL
ALT: 12 U/L (ref 0–44)
AST: 16 U/L (ref 15–41)
Albumin: 3.6 g/dL (ref 3.5–5.0)
Alkaline Phosphatase: 74 U/L (ref 38–126)
Anion gap: 12 (ref 5–15)
BUN: 21 mg/dL (ref 8–23)
CO2: 23 mmol/L (ref 22–32)
Calcium: 9.4 mg/dL (ref 8.9–10.3)
Chloride: 105 mmol/L (ref 98–111)
Creatinine, Ser: 0.89 mg/dL (ref 0.44–1.00)
GFR, Estimated: >60 mL/min (ref >60)
Glucose, Bld: 116 mg/dL — ABNORMAL HIGH (ref 70–99)
Potassium: 3.5 mmol/L (ref 3.5–5.1)
Sodium: 140 mmol/L (ref 135–145)
Total Bilirubin: 0.5 mg/dL (ref 0.3–1.2)
Total Protein: 7.3 g/dL (ref 6.5–8.1)

## 2021-08-25 LAB — CBC WITH DIFFERENTIAL/PLATELET
Abs Immature Granulocytes: 0 10*3/uL (ref 0.00–0.07)
Basophils Absolute: 0 10*3/uL (ref 0.0–0.1)
Basophils Relative: 1 %
Eosinophils Absolute: 0.2 10*3/uL (ref 0.0–0.5)
Eosinophils Relative: 6 %
HCT: 33.9 % — ABNORMAL LOW (ref 36.0–46.0)
Hemoglobin: 11 g/dL — ABNORMAL LOW (ref 12.0–15.0)
Immature Granulocytes: 0 %
Lymphocytes Relative: 26 %
Lymphs Abs: 1.1 10*3/uL (ref 0.7–4.0)
MCH: 28.6 pg (ref 26.0–34.0)
MCHC: 32.4 g/dL (ref 30.0–36.0)
MCV: 88.1 fL (ref 80.0–100.0)
Monocytes Absolute: 0.5 10*3/uL (ref 0.1–1.0)
Monocytes Relative: 13 %
Neutro Abs: 2.3 10*3/uL (ref 1.7–7.7)
Neutrophils Relative %: 54 %
Platelets: 221 10*3/uL (ref 150–400)
RBC: 3.85 MIL/uL — ABNORMAL LOW (ref 3.87–5.11)
RDW: 14.2 % (ref 11.5–15.5)
WBC: 4.2 10*3/uL (ref 4.0–10.5)
nRBC: 0 % (ref 0.0–0.2)

## 2021-08-25 MED ORDER — DIPHENHYDRAMINE HCL 25 MG PO CAPS
25.0000 mg | ORAL_CAPSULE | Freq: Once | ORAL | Status: AC
  Administered 2021-08-25: 25 mg via ORAL
  Filled 2021-08-25: qty 1

## 2021-08-25 MED ORDER — ACETAMINOPHEN 325 MG PO TABS
650.0000 mg | ORAL_TABLET | Freq: Once | ORAL | Status: AC
  Administered 2021-08-25: 650 mg via ORAL
  Filled 2021-08-25: qty 2

## 2021-08-25 MED ORDER — TRASTUZUMAB-HYALURONIDASE-OYSK 600-10000 MG-UNT/5ML ~~LOC~~ SOLN
600.0000 mg | Freq: Once | SUBCUTANEOUS | Status: AC
  Administered 2021-08-25: 600 mg via SUBCUTANEOUS
  Filled 2021-08-25: qty 5

## 2021-08-25 NOTE — Patient Instructions (Signed)
[facility]  Discharge Instructions: Thank you for choosing [facility] to provide your oncology and hematology care.   If you have a lab appointment with the [facility], please go directly to the [facility] and check in at the registration area.   Wear comfortable clothing and clothing appropriate for easy access to any Portacath or PICC line.   We strive to give you quality time with your provider. You may need to reschedule your appointment if you arrive late (15 or more minutes).  Arriving late affects you and other patients whose appointments are after yours.  Also, if you miss three or more appointments without notifying the office, you may be dismissed from the clinic at the provider's discretion.      For prescription refill requests, have your pharmacy contact our office and allow 72 hours for refills to be completed.    Today you received the following chemotherapy and/or immunotherapy agents: Herceptin Hylecta     To help prevent nausea and vomiting after your treatment, we encourage you to take your nausea medication as directed.  BELOW ARE SYMPTOMS THAT SHOULD BE REPORTED IMMEDIATELY: *FEVER GREATER THAN 100.4 F (38 C) OR HIGHER *CHILLS OR SWEATING *NAUSEA AND VOMITING THAT IS NOT CONTROLLED WITH YOUR NAUSEA MEDICATION *UNUSUAL SHORTNESS OF BREATH *UNUSUAL BRUISING OR BLEEDING *URINARY PROBLEMS (pain or burning when urinating, or frequent urination) *BOWEL PROBLEMS (unusual diarrhea, constipation, pain near the anus) TENDERNESS IN MOUTH AND THROAT WITH OR WITHOUT PRESENCE OF ULCERS (sore throat, sores in mouth, or a toothache) UNUSUAL RASH, SWELLING OR PAIN  UNUSUAL VAGINAL DISCHARGE OR ITCHING   Items with * indicate a potential emergency and should be followed up as soon as possible or go to the Emergency Department if any problems should occur.  Please show the CHEMOTHERAPY ALERT CARD or IMMUNOTHERAPY ALERT CARD at  check-in to the Emergency Department and triage nurse.  Should you have questions after your visit or need to cancel or reschedule your appointment, please contact [facility]  Dept: [phone]  and follow the prompts.  Office hours are 8:00 a.m. to 4:30 p.m. Monday - Friday. Please note that voicemails left after 4:00 p.m. may not be returned until the following business day.  We are closed weekends and major holidays. You have access to a nurse at all times for urgent questions. Please call the main number to the clinic Dept: [phone] and follow the prompts.   For any non-urgent questions, you may also contact your provider using MyChart. We now offer e-Visits for anyone 30 and older to request care online for non-urgent symptoms. For details visit [email or web address].   Also download the MyChart app! Go to the app store, search "MyChart", open the app, select , and log in with your MyChart username and password.  Due to Covid, a mask is required upon entering the hospital/clinic. If you do not have a mask, one will be given to you upon arrival. For doctor visits, patients may have 1 support person aged 18 or older with them. For treatment visits, patients cannot have anyone with them due to current Covid guidelines and our immunocompromised population.   Trastuzumab; Hyaluronidase injection What is this medication? TRASTUZUMAB; HYALURONIDASE (tras TOO zoo mab / hye al ur ON i dase) is used to treat breast cancer and stomach cancer. Trastuzumab is a monoclonal antibody. Hyaluronidase is used to improve the effects of trastuzumab. This medicine may be used for  other purposes; ask your health care provider or pharmacist if you have questions. COMMON BRAND NAME(S): HERCEPTIN HYLECTA What should I tell my care team before I take this medication? They need to know if you have any of these conditions: heart disease heart failure lung or breathing  disease, like asthma an unusual or allergic reaction to trastuzumab, or other medications, foods, dyes, or preservatives pregnant or trying to get pregnant breast-feeding How should I use this medication? This medicine is for injection under the skin. It is given by a health care professional in a hospital or clinic setting. Talk to your pediatrician regarding the use of this medicine in children. This medicine is not approved for use in children. Overdosage: If you think you have taken too much of this medicine contact a poison control center or emergency room at once. NOTE: This medicine is only for you. Do not share this medicine with others. What if I miss a dose? It is important not to miss a dose. Call your doctor or health care professional if you are unable to keep an appointment. What may interact with this medication? This medicine may interact with the following medications: certain types of chemotherapy, such as daunorubicin, doxorubicin, epirubicin, and idarubicin This list may not describe all possible interactions. Give your health care provider a list of all the medicines, herbs, non-prescription drugs, or dietary supplements you use. Also tell them if you smoke, drink alcohol, or use illegal drugs. Some items may interact with your medicine. What should I watch for while using this medication? Visit your doctor for checks on your progress. Report any side effects. Continue your course of treatment even though you feel ill unless your doctor tells you to stop. Call your doctor or health care professional for advice if you get a fever, chills or sore throat, or other symptoms of a cold or flu. Do not treat yourself. Try to avoid being around people who are sick. You may experience fever, chills and shaking during your first infusion. These effects are usually mild and can be treated with other medicines. Report any side effects during the infusion to your health care professional.  Fever and chills usually do not happen with later infusions. Do not become pregnant while taking this medicine or for 7 months after stopping it. Women should inform their doctor if they wish to become pregnant or think they might be pregnant. Women of child-bearing potential will need to have a negative pregnancy test before starting this medicine. There is a potential for serious side effects to an unborn child. Talk to your health care professional or pharmacist for more information. Do not breast-feed an infant while taking this medicine or for 7 months after stopping it. What side effects may I notice from receiving this medication? Side effects that you should report to your doctor or health care professional as soon as possible: allergic reactions like skin rash, itching or hives, swelling of the face, lips, or tongue breathing problems chest pain or palpitations cough fever general ill feeling or flu-like symptoms signs of worsening heart failure like breathing problems; swelling in your legs and feet Side effects that usually do not require medical attention (report these to your doctor or health care professional if they continue or are bothersome): bone pain changes in taste diarrhea joint pain nausea/vomiting unusually weak or tired weight loss This list may not describe all possible side effects. Call your doctor for medical advice about side effects. You may report side effects  to FDA at 1-800-FDA-1088. Where should I keep my medication? This drug is given in a hospital or clinic and will not be stored at home. NOTE: This sheet is a summary. It may not cover all possible information. If you have questions about this medicine, talk to your doctor, pharmacist, or health care provider.  2022 Elsevier/Gold Standard (2018-01-05 21:54:17)

## 2021-09-02 ENCOUNTER — Encounter: Payer: Self-pay | Admitting: Orthopaedic Surgery

## 2021-09-02 ENCOUNTER — Ambulatory Visit: Payer: Self-pay

## 2021-09-02 ENCOUNTER — Ambulatory Visit (INDEPENDENT_AMBULATORY_CARE_PROVIDER_SITE_OTHER): Payer: BC Managed Care – PPO | Admitting: Orthopaedic Surgery

## 2021-09-02 ENCOUNTER — Other Ambulatory Visit: Payer: Self-pay

## 2021-09-02 DIAGNOSIS — M170 Bilateral primary osteoarthritis of knee: Secondary | ICD-10-CM

## 2021-09-02 DIAGNOSIS — M5442 Lumbago with sciatica, left side: Secondary | ICD-10-CM | POA: Diagnosis not present

## 2021-09-02 DIAGNOSIS — M5440 Lumbago with sciatica, unspecified side: Secondary | ICD-10-CM

## 2021-09-02 DIAGNOSIS — M545 Low back pain, unspecified: Secondary | ICD-10-CM | POA: Insufficient documentation

## 2021-09-02 DIAGNOSIS — G8929 Other chronic pain: Secondary | ICD-10-CM

## 2021-09-02 MED ORDER — SODIUM HYALURONATE (VISCOSUP) 16.8 MG/2ML IX SOSY
16.8000 mg | PREFILLED_SYRINGE | INTRA_ARTICULAR | Status: AC | PRN
  Administered 2021-09-02: 16.8 mg via INTRA_ARTICULAR

## 2021-09-02 NOTE — Progress Notes (Signed)
Office Visit Note   Patient: Jillian Buchanan           Date of Birth: February 22, 1933           MRN: [id number] Visit Date: 09/02/2021              Requested by: Seward Carol, MD [address] [place],  [place] [address] PCP: Seward Carol, MD   Assessment & Plan: Visit Diagnoses:  1. Low back pain with sciatica, sciatica laterality unspecified, unspecified back pain laterality, unspecified chronicity   2. Bilateral primary osteoarthritis of knee   3. Chronic left-sided low back pain with left-sided sciatica     Plan: Mr.Lybbert comes to the office today for her second Lurline Del injection of both of her knees.  She did not have any problems with the first injections last week.  She will return next week to complete the series of 3.  In addition she is complaining of low back pain with some referred discomfort into her left lower extremity possibly as far distally as the left calf.  She does note some discomfort getting up in the morning.  Does not appear to have claudication.  Films of the lumbar spine demonstrate significant degenerative changes.  We will try a course of physical therapy and if no improvement would obtain an MRI scan  Follow-Up Instructions: Return in about 1 week (around 09/09/2021).   Orders:  Orders Placed This Encounter  Procedures   XR Lumbar Spine 2-3 Views   Ambulatory referral to Physical Therapy   No orders of the defined types were placed in this encounter.     Procedures: Large Joint Inj: bilateral knee on 09/02/2021 1:44 PM Indications: pain and joint swelling Details: 25 G 1.5 in needle, anteromedial approach  Arthrogram: No  Medications (Right): 16.8 mg Sodium Hyaluronate (Viscosup) 16.8 MG/2ML Medications (Left): 16.8 mg Sodium Hyaluronate (Viscosup) 16.8 MG/2ML Outcome: tolerated well, no immediate complications Procedure, treatment alternatives, risks and benefits explained, specific risks discussed. Consent was given by the  patient. Immediately prior to procedure a time out was called to verify the correct patient, procedure, equipment, support staff and site/side marked as required. Patient was prepped and draped in the usual sterile fashion.      Clinical Data: No additional findings.   Subjective: Chief Complaint  Patient presents with   Right Knee - Follow-up    Gelsyn   Left Knee - Follow-up    Gelsyn  Patient presents today for the second Gelsyn injections bilaterally.  In addition relates having some problems with chronic low back pain with some referred pain into the left lower extremity.  No weakness.  Notes that she does not walk with a limp or use any ambulatory aid.  Most of her pain is in her back.  No changes in bowel or bladder function  HPI  Review of Systems   Objective: Vital Signs: There were no vitals taken for this visit.  Physical Exam Constitutional:      Appearance: She is well-developed.  Pulmonary:     Effort: Pulmonary effort is normal.  Skin:    General: Skin is warm and dry.  Neurological:     Mental Status: She is alert and oriented to person, place, and time.  Psychiatric:        Behavior: Behavior normal.    Ortho Exam large knees.  The knees were not hot warm or red and no obvious effusion.  Just minimal medial lateral joint pains.  Some patella crepitation but no particular discomfort with flexion extension.  Straight leg raise was negative.  Motor exam intact.  Feet were warm.  Painless range of motion both hips.  Very minimal percussible tenderness of the lumbar spine.  No pain over either SI joint  Specialty Comments:  No specialty comments available.  Imaging: XR Lumbar Spine 2-3 Views  Result Date: 09/02/2021 Films of the lumbar spine were obtained in 2 projections.  There is a very mild degenerative left-sided scoliosis.  There is mild degenerative disc disease at L4-5 with slight anterior listhesis of 4 on 5.  Very mild calcification of the  abdominal aorta without obvious aneurysmal dilatation.  Facet sclerosis at L4-5 and L5-S1.  No obvious bony pathology other than the arthritis    PMFS History: Patient Active Problem List   Diagnosis Date Noted   Low back pain 09/02/2021   Trigger finger, right middle finger 08/24/2021   Genetic testing 06/18/2021   Family history of breast cancer 05/26/2021   Family history of bone cancer 05/26/2021   Malignant neoplasm of upper-inner quadrant of left breast in female, estrogen receptor positive ([facility]) 05/24/2021   Bilateral primary osteoarthritis of knee 11/12/2019   Obesity 11/12/2019   Chronic right shoulder pain 11/27/2017   Unilateral primary osteoarthritis, left knee 11/27/2017   Past Medical History:  Diagnosis Date   Anxiety    self reported   Breast cancer ([facility])    Cataract    Family history of bone cancer    Family history of breast cancer    Hypertension     Family History  Problem Relation Age of Onset   Breast cancer Maternal Aunt        x5 aunts, dx >50   Breast cancer Paternal Aunt        dx >50   Breast cancer Cousin        x4 maternal first cousins   Bone cancer Daughter 32    Past Surgical History:  Procedure Laterality Date   ABDOMINAL HYSTERECTOMY     BREAST LUMPECTOMY  1988   BREAST LUMPECTOMY WITH RADIOACTIVE SEED LOCALIZATION Left 06/15/2021   Procedure: LEFT BREAST LUMPECTOMY WITH RADIOACTIVE SEED LOCALIZATION;  Surgeon: Rolm Bookbinder, MD;  Location: [facility];  Service: General;  Laterality: Left;   INTRAOCULAR PROSTHESES INSERTION  8/248/2005   Social History   Occupational History   Not on file  Tobacco Use   Smoking status: Never   Smokeless tobacco: Never  Vaping Use   Vaping Use: Never used  Substance and Sexual Activity   Alcohol use: Yes    Comment: rarely, social   Drug use: No   Sexual activity: Never

## 2021-09-06 ENCOUNTER — Other Ambulatory Visit: Payer: Self-pay

## 2021-09-06 ENCOUNTER — Encounter: Payer: Self-pay | Admitting: Rehabilitative and Restorative Service Providers"

## 2021-09-06 ENCOUNTER — Ambulatory Visit (INDEPENDENT_AMBULATORY_CARE_PROVIDER_SITE_OTHER): Payer: BC Managed Care – PPO | Admitting: Rehabilitative and Restorative Service Providers"

## 2021-09-06 DIAGNOSIS — M25562 Pain in left knee: Secondary | ICD-10-CM | POA: Diagnosis not present

## 2021-09-06 DIAGNOSIS — G8929 Other chronic pain: Secondary | ICD-10-CM | POA: Diagnosis not present

## 2021-09-06 DIAGNOSIS — M25561 Pain in right knee: Secondary | ICD-10-CM

## 2021-09-06 DIAGNOSIS — M6281 Muscle weakness (generalized): Secondary | ICD-10-CM

## 2021-09-06 DIAGNOSIS — R293 Abnormal posture: Secondary | ICD-10-CM | POA: Diagnosis not present

## 2021-09-06 DIAGNOSIS — M5442 Lumbago with sciatica, left side: Secondary | ICD-10-CM

## 2021-09-06 DIAGNOSIS — R262 Difficulty in walking, not elsewhere classified: Secondary | ICD-10-CM | POA: Diagnosis not present

## 2021-09-06 NOTE — Patient Instructions (Signed)
Access Code: [email or web address] URL: [email or web address] Date: 09/06/2021 Prepared by: Scot Jun  Exercises Supine Bridge - 2 x daily - 7 x weekly - 2-3 sets - 10 reps - 2 hold Supine 90/90 Sciatic Nerve Glide with Knee Flexion/Extension (Mirrored) - 2 x daily - 7 x weekly - 2-3 sets - 10 reps Supine Lower Trunk Rotation - 2 x daily - 7 x weekly - 1 sets - 5 reps - 15 hold Standing Lumbar Extension - 2 x daily - 7 x weekly - 1-2 sets - 5-10 reps

## 2021-09-06 NOTE — Therapy (Signed)
[facility] Physical Therapy [address] [place], [place], [address] Phone: [phone]   Fax:  [phone]  Physical Therapy Evaluation  Patient Details  Name: Jillian Buchanan MRN: [id number] Date of Birth: 08-06-1933 Referring Provider (PT): Garald Balding, MD   Encounter Date: 09/06/2021   PT End of Session - 09/06/21 1008     Visit Number 1    Number of Visits 20    Date for PT Re-Evaluation 11/15/21    Authorization Type BCBS State and [facility] Medicare    Progress Note Due on Visit 10    PT Start Time 1010    PT Stop Time 1045    PT Time Calculation (min) 35 min    Activity Tolerance Patient tolerated treatment well    Behavior During Therapy WFL for tasks assessed/performed             Past Medical History:  Diagnosis Date   Anxiety    self reported   Breast cancer ([facility])    Cataract    Family history of bone cancer    Family history of breast cancer    Hypertension     Past Surgical History:  Procedure Laterality Date   ABDOMINAL HYSTERECTOMY     BREAST LUMPECTOMY  1988   BREAST LUMPECTOMY WITH RADIOACTIVE SEED LOCALIZATION Left 06/15/2021   Procedure: LEFT BREAST LUMPECTOMY WITH RADIOACTIVE SEED LOCALIZATION;  Surgeon: Rolm Bookbinder, MD;  Location: [facility];  Service: General;  Laterality: Left;   INTRAOCULAR PROSTHESES INSERTION  8/248/2005    There were no vitals filed for this visit.    Subjective Assessment - 09/06/21 1008     Subjective Pt. indicated complaints of Lt lower back pain that comes into Lt lateral leg, sometimes down to ankle.  Pt. indicated she had a visit to Urgent Care back in June due to complaints.  Pt. indicated some trouble sleeping due to stiffness/discomfort.  No bowel/bladder changes.  Feels better in morning.    Pertinent History History of knee pain c recent injections. xray shows degenerative changes  HTN, history of breast cancer    Limitations Standing;Walking;House hold  activities    Patient Stated Goals Reduce pain, be more active.    Currently in Pain? Yes    Pain Score 10-Worst pain ever   pain at worst 10/10   Pain Location Back    Pain Orientation Lower    Pain Type Chronic pain    Pain Radiating Towards Lt lateral/posterior leg    Pain Onset More than a month ago    Pain Frequency Intermittent    Aggravating Factors  prolonged standing/walking,    Pain Relieving Factors topical cream, heating pad at night                [facility] PT Assessment - 09/06/21 0001       Assessment   Medical Diagnosis M54.40 (ICD-10-CM) - Low back pain with sciatica, sciatica laterality unspecified, unspecified back pain laterality, unspecified chronicity    Referring Provider (PT) Garald Balding, MD    Onset Date/Surgical Date 03/31/21    Hand Dominance Right      Precautions   Precautions None      Balance Screen   Has the patient fallen in the past 6 months No    Has the patient had a decrease in activity level because of a fear of falling?  No    Is the patient reluctant to leave their home because of a fear of  falling?  No      Home Ec[place] residence    Living Arrangements Spouse/significant other    Additional Comments 13 steps to bedroom      Prior Function   Vocation Requirements Retired    Catering manager, [employer]   Overall Cognitive Status Within Functional Limits for tasks assessed      Observation/Other Assessments   Focus on Therapeutic Outcomes (FOTO)  intake 25, predicted 44%      Sensation   Light Touch Appears Intact      Functional Tests   Functional tests Sit to Stand      Sit to Stand   Comments 18 inch c UE on knees      Posture/Postural Control   Posture Comments Mild forward trunk lean, reduced lumbar lordosis, very mild Rt lateral trunk shift      ROM / Strength   AROM / PROM / Strength Strength;PROM;AROM      AROM   Overall AROM Comments No observed  peripherialization of symptoms in clinic today    AROM Assessment Site Lumbar    Lumbar Flexion movement to ankles - back pain c painful arc.  No leg symptoms    Lumbar Extension 25% c ERP in lumbar with no leg symptoms noted.  Repeated 5x in standing : improved movement to 50% no leg symptoms   Limited     Strength   Strength Assessment Site Hip;Knee;Ankle    Right/Left Hip Right;Left    Right Hip Flexion 5/5    Left Hip Flexion 5/5    Right/Left Knee Right;Left    Right Knee Flexion 4/5    Right Knee Extension 4/5    Left Knee Flexion 4/5    Left Knee Extension 4/5    Right/Left Ankle Left;Right    Right Ankle Dorsiflexion 5/5    Left Ankle Dorsiflexion 5/5      Special Tests   Other special tests (+) Slump on Lt, hamstring passive SLR bilateral to 50 degrees c no radicular symptoms noted, (-) crossed SLR bilateral      Ambulation/Gait   Gait Comments SPC in Rt UE                        Objective measurements completed on examination: See above findings.       [facility] PT Treatment/Exercise - 09/06/21 0001       Exercises   Exercises Other Exercises;Lumbar    Other Exercises  HEP instruction/performance c cues for techniques, handout provided.  Trial set performed of each for comprehension and symptom assessment.   lumbar trunk rotation 15 sec x 3 bilateral, sciatic nerve flossing supine x 10 Lt, bridge x 5, standing lumbar extension x 10                     PT Education - 09/06/21 1008     Education Details HEP, POC    Person(s) Educated Patient    Methods Explanation;Demonstration;Verbal cues;Handout    Comprehension Verbalized understanding;Returned demonstration              PT Short Term Goals - 09/06/21 1008       PT SHORT TERM GOAL #1   Title Patient will demonstrate independent use of home exercise program to maintain progress from in clinic treatments.    Time 3    Period Weeks    Status New  Target Date 09/27/21                PT Long Term Goals - 09/06/21 1009       PT LONG TERM GOAL #1   Title Patient will demonstrate/report pain at worst less than or equal to 2/10 to facilitate minimal limitation in daily activity secondary to pain symptoms.    Time 10    Period Weeks    Status New    Target Date 11/15/21      PT LONG TERM GOAL #2   Title Patient will demonstrate independent use of home exercise program to facilitate ability to maintain/progress functional gains from skilled physical therapy services.    Time 10    Period Weeks    Status New    Target Date 11/15/21      PT LONG TERM GOAL #3   Title Pt. will demonstrate FOTO outcome > or = 44 % to indicated reduced disability due to condition.    Time 10    Period Weeks    Status New    Target Date 11/15/21      PT LONG TERM GOAL #4   Title Patient will demonstrate lumbar extension 100 % WFL s symptoms to facilitate upright standing, walking posture at PLOF s limitation.    Time 10    Period Weeks    Status New    Target Date 11/15/21      PT LONG TERM GOAL #5   Title Pt. will demonstrate bilateral knee MMT 5/5 throughout to facilitate usual daily and household activity at PLOF.    Time 10    Period Weeks    Status New    Target Date 11/15/21                    Plan - 09/06/21 1010     Clinical Impression Statement Patient is a 87 y.o. who comes to clinic with complaints of low back pain, Lt leg pain with mobility, strength and movement coordination deficits that impair their ability to perform usual daily and recreational functional activities without increase difficulty/symptoms at this time.  Pt. also has concurrent complaints of bilateral knee pain which impact function. Patient to benefit from skilled PT services to address impairments and limitations to improve to previous level of function without restriction secondary to condition.    Personal Factors and Comorbidities Comorbidity 3+    Comorbidities  HTN, bilateral chronic knee pain, history of breast cancer    Examination-Activity Limitations Sleep;Squat;Bend;Stairs;Stand;Transfers;Lift;Locomotion Level    Examination-Participation Restrictions Shop;Community Activity;Laundry;Cleaning    Stability/Clinical Decision Making Stable/Uncomplicated    Clinical Decision Making Low    Rehab Potential Good    PT Frequency Other (comment)   1-2x/week   PT Duration Other (comment)   10 weeks   PT Treatment/Interventions ADLs/Self Care Home Management;Electrical Stimulation;Cryotherapy;Iontophoresis 4mg /ml Dexamethasone;Moist Heat;Balance training;Traction;Therapeutic exercise;Therapeutic activities;Functional mobility training;Stair training;Gait training;DME Instruction;Ultrasound;Neuromuscular re-education;Patient/family education;Passive range of motion;Spinal Manipulations;Joint Manipulations;Dry needling;Taping;Manual techniques    PT Next Visit Plan Reassess lateral shift presence, improve lumbar extension mobility, progressive LE strengthening (monitor knee pains)    PT Home Exercise Plan [email or web address]    Consulted and Agree with Plan of Care Patient             Patient will benefit from skilled therapeutic intervention in order to improve the following deficits and impairments:  Abnormal gait, Hypomobility, Decreased activity tolerance, Decreased strength, Pain, Difficulty walking, Decreased mobility, Decreased balance, Decreased range of motion, Impaired perceived  functional ability, Improper body mechanics, Postural dysfunction, Impaired flexibility, Decreased coordination  Visit Diagnosis: Chronic bilateral low back pain with left-sided sciatica  Muscle weakness (generalized)  Difficulty in walking, not elsewhere classified  Abnormal posture  Chronic pain of left knee  Chronic pain of right knee     Problem List Patient Active Problem List   Diagnosis Date Noted   Low back pain 09/02/2021   Trigger finger, right middle finger  08/24/2021   Genetic testing 06/18/2021   Family history of breast cancer 05/26/2021   Family history of bone cancer 05/26/2021   Malignant neoplasm of upper-inner quadrant of left breast in female, estrogen receptor positive ([facility]) 05/24/2021   Bilateral primary osteoarthritis of knee 11/12/2019   Obesity 11/12/2019   Chronic right shoulder pain 11/27/2017   Unilateral primary osteoarthritis, left knee 11/27/2017   Scot Jun, PT, DPT, OCS, ATC 09/06/21  10:50 AM    [facility] Physical Therapy [address] [place], [place], [address] Phone: [phone]   Fax:  [phone]  Name: Jillian Buchanan MRN: [id number] Date of Birth: 1933-08-21

## 2021-09-08 ENCOUNTER — Other Ambulatory Visit: Payer: Self-pay

## 2021-09-08 ENCOUNTER — Ambulatory Visit (INDEPENDENT_AMBULATORY_CARE_PROVIDER_SITE_OTHER): Payer: BC Managed Care – PPO | Admitting: Physical Therapy

## 2021-09-08 DIAGNOSIS — M25561 Pain in right knee: Secondary | ICD-10-CM

## 2021-09-08 DIAGNOSIS — G8929 Other chronic pain: Secondary | ICD-10-CM | POA: Diagnosis not present

## 2021-09-08 DIAGNOSIS — M5442 Lumbago with sciatica, left side: Secondary | ICD-10-CM

## 2021-09-08 DIAGNOSIS — R262 Difficulty in walking, not elsewhere classified: Secondary | ICD-10-CM

## 2021-09-08 DIAGNOSIS — M25562 Pain in left knee: Secondary | ICD-10-CM | POA: Diagnosis not present

## 2021-09-08 DIAGNOSIS — M6281 Muscle weakness (generalized): Secondary | ICD-10-CM | POA: Diagnosis not present

## 2021-09-08 DIAGNOSIS — R293 Abnormal posture: Secondary | ICD-10-CM | POA: Diagnosis not present

## 2021-09-08 NOTE — Therapy (Signed)
[facility] [address] [place], [place], [address] Phone: [phone]   Fax:  [phone]  Physical Therapy Treatment  Patient Details  Name: Jillian Buchanan MRN: [id number] Date of Birth: 03/26/1933 Referring Provider (PT): Garald Balding, MD   Encounter Date: 09/08/2021   PT End of Session - 09/08/21 0951     Visit Number 2    Number of Visits 20    Date for PT Re-Evaluation 11/15/21    Authorization Type BCBS State and [employer] Medicare    PT Start Time 0930    PT Stop Time 1015    PT Time Calculation (min) 45 min    Activity Tolerance Patient tolerated treatment well    Behavior During Therapy [facility] for tasks assessed/performed             Past Medical History:  Diagnosis Date   Anxiety    self reported   Breast cancer ([place])    Cataract    Family history of bone cancer    Family history of breast cancer    Hypertension     Past Surgical History:  Procedure Laterality Date   ABDOMINAL HYSTERECTOMY     BREAST LUMPECTOMY  1988   BREAST LUMPECTOMY WITH RADIOACTIVE SEED LOCALIZATION Left 06/15/2021   Procedure: LEFT BREAST LUMPECTOMY WITH RADIOACTIVE SEED LOCALIZATION;  Surgeon: Rolm Bookbinder, MD;  Location: [place];  Service: General;  Laterality: Left;   INTRAOCULAR PROSTHESES INSERTION  8/248/2005    There were no vitals filed for this visit.   Subjective Assessment - 09/08/21 0935     Subjective Pt reports she's been able to do the exercises. States that the bridging exercises was the hardest.    Pertinent History History of knee pain c recent injections. xray shows degenerative changes  HTN, history of breast cancer    Limitations Standing;Walking;House hold activities    Patient Stated Goals Reduce pain, be more active.    Currently in Pain? Yes    Pain Score 5     Pain Location Back    Pain Orientation Lower    Pain Onset More than a month ago                [facility] PT Assessment -  09/08/21 0001       Assessment   Medical Diagnosis M54.40 (ICD-10-CM) - Low back pain with sciatica, sciatica laterality unspecified, unspecified back pain laterality, unspecified chronicity    Referring Provider (PT) Garald Balding, MD    Onset Date/Surgical Date 03/31/21    Hand Dominance Right                           [facility] Adult PT Treatment/Exercise - 09/08/21 0001       Lumbar Exercises: Stretches   Lower Trunk Rotation 3 reps   15 sec   Standing Extension 1 rep;30 seconds    Figure 4 Stretch 2 reps;30 seconds;Supine    Figure 4 Stretch Limitations seated and supine    Other Lumbar Stretch Exercise Sciatic nerve glide supine 2x10 each leg      Lumbar Exercises: Aerobic   Nustep L3 x 5 min UEs/LEs      Lumbar Exercises: Supine   Ab Set 10 reps;3 seconds    AB Set Limitations pushing down on pball    Pelvic Tilt 2 reps;10 reps    Bridge 10 reps      Lumbar Exercises: Sidelying  Clam Right;Left;10 reps      Manual Therapy   Manual Therapy Soft tissue mobilization    Manual therapy comments gentle posterior rotation of L inominate    Soft tissue mobilization TPR and STM of glute and piriformis                     PT Education - 09/08/21 0951     Education Details HEP updates    Person(s) Educated Patient    Methods Explanation;Demonstration;Tactile cues;Verbal cues;Handout    Comprehension Verbalized understanding;Returned demonstration;Verbal cues required;Tactile cues required              PT Short Term Goals - 09/06/21 1008       PT SHORT TERM GOAL #1   Title Patient will demonstrate independent use of home exercise program to maintain progress from in clinic treatments.    Time 3    Period Weeks    Status New    Target Date 09/27/21               PT Long Term Goals - 09/06/21 1009       PT LONG TERM GOAL #1   Title Patient will demonstrate/report pain at worst less than or equal to 2/10 to facilitate  minimal limitation in daily activity secondary to pain symptoms.    Time 10    Period Weeks    Status New    Target Date 11/15/21      PT LONG TERM GOAL #2   Title Patient will demonstrate independent use of home exercise program to facilitate ability to maintain/progress functional gains from skilled physical therapy services.    Time 10    Period Weeks    Status New    Target Date 11/15/21      PT LONG TERM GOAL #3   Title Pt. will demonstrate FOTO outcome > or = 44 % to indicated reduced disability due to condition.    Time 10    Period Weeks    Status New    Target Date 11/15/21      PT LONG TERM GOAL #4   Title Patient will demonstrate lumbar extension 100 % WFL s symptoms to facilitate upright standing, walking posture at PLOF s limitation.    Time 10    Period Weeks    Status New    Target Date 11/15/21      PT LONG TERM GOAL #5   Title Pt. will demonstrate bilateral knee MMT 5/5 throughout to facilitate usual daily and household activity at PLOF.    Time 10    Period Weeks    Status New    Target Date 11/15/21                   Plan - 09/08/21 0939     Clinical Impression Statement Reviewed HEP. Treatment focused on continuing stretching and strengthening LEs. Initiated core stabilization exercises. In static standing, pt able to reduce L lateral shift; present during ambulation. Pt with difficulty performing sidelying hip abduction and clamshell on L without lumbar rotation.    Personal Factors and Comorbidities Comorbidity 3+    Comorbidities HTN, bilateral chronic knee pain, history of breast cancer    Examination-Activity Limitations Sleep;Squat;Bend;Stairs;Stand;Transfers;Lift;Locomotion Level    Examination-Participation Restrictions Shop;Community Activity;Laundry;Cleaning    Stability/Clinical Decision Making Stable/Uncomplicated    Rehab Potential Good    PT Frequency Other (comment)   1-2x/week   PT Duration Other (comment)   10 weeks  PT  Treatment/Interventions ADLs/Self Care Home Management;Electrical Stimulation;Cryotherapy;Iontophoresis 4mg /ml Dexamethasone;Moist Heat;Balance training;Traction;Therapeutic exercise;Therapeutic activities;Functional mobility training;Stair training;Gait training;DME Instruction;Ultrasound;Neuromuscular re-education;Patient/family education;Passive range of motion;Spinal Manipulations;Joint Manipulations;Dry needling;Taping;Manual techniques    PT Next Visit Plan Work on gait training to reduce lateral shift and improve standing posture, improve lumbar extension mobility, progressive LE strengthening (monitor knee pains)    PT Home Exercise Plan [id number]    Consulted and Agree with Plan of Care Patient             Patient will benefit from skilled therapeutic intervention in order to improve the following deficits and impairments:  Abnormal gait, Hypomobility, Decreased activity tolerance, Decreased strength, Pain, Difficulty walking, Decreased mobility, Decreased balance, Decreased range of motion, Impaired perceived functional ability, Improper body mechanics, Postural dysfunction, Impaired flexibility, Decreased coordination  Visit Diagnosis: Chronic bilateral low back pain with left-sided sciatica  Muscle weakness (generalized)  Difficulty in walking, not elsewhere classified  Abnormal posture  Chronic pain of left knee  Chronic pain of right knee     Problem List Patient Active Problem List   Diagnosis Date Noted   Low back pain 09/02/2021   Trigger finger, right middle finger 08/24/2021   Genetic testing 06/18/2021   Family history of breast cancer 05/26/2021   Family history of bone cancer 05/26/2021   Malignant neoplasm of upper-inner quadrant of left breast in female, estrogen receptor positive ([place]) 05/24/2021   Bilateral primary osteoarthritis of knee 11/12/2019   Obesity 11/12/2019   Chronic right shoulder pain 11/27/2017   Unilateral primary osteoarthritis, left  knee 11/27/2017    [facility] April Gordy Levan, PT, DPT 09/08/2021, 10:21 AM  [facility] [address] [place], [place], [address] Phone: [phone]   Fax:  [phone]  Name: RAUL WINTERHALTER MRN: [id number] Date of Birth: May 07, 1933

## 2021-09-09 ENCOUNTER — Ambulatory Visit (INDEPENDENT_AMBULATORY_CARE_PROVIDER_SITE_OTHER): Payer: BC Managed Care – PPO | Admitting: Orthopaedic Surgery

## 2021-09-09 ENCOUNTER — Encounter: Payer: Self-pay | Admitting: Orthopaedic Surgery

## 2021-09-09 DIAGNOSIS — M170 Bilateral primary osteoarthritis of knee: Secondary | ICD-10-CM

## 2021-09-09 MED ORDER — SODIUM HYALURONATE (VISCOSUP) 16.8 MG/2ML IX SOSY
16.8000 mg | PREFILLED_SYRINGE | INTRA_ARTICULAR | Status: AC | PRN
  Administered 2021-09-09: 16.8 mg via INTRA_ARTICULAR

## 2021-09-09 NOTE — Progress Notes (Signed)
Office Visit Note   Patient: Jillian Buchanan           Date of Birth: 01/31/1933           MRN: [id number] Visit Date: 09/09/2021              Requested by: Seward Carol, MD [address] [place] [address] [place],  [place] [address] PCP: Seward Carol, MD   Assessment & Plan: Visit Diagnoses: No diagnosis found.  Plan: Patient is here for her third in a series of gelsyn injections into her bilateral knees.  She has tolerated the previous 2 injections quite well.  She is currently doing physical therapy for her back and feels this is slowly going to help her.  I have told her that if she finishes physical therapy and still has significant back difficulty she can contact us we could order an MRI.  Can follow-up with her knees as needed.  She also talks again about her trigger finger which she has been seen for before.  I told her we could inject this or could consider surgical release she said she is not quite ready for this at this time  Follow-Up Instructions: No follow-ups on file.   Orders:  No orders of the defined types were placed in this encounter.  No orders of the defined types were placed in this encounter.     Procedures: Large Joint Inj: bilateral knee on 09/09/2021 1:58 PM Indications: pain and diagnostic evaluation Details: 22 G 1.5 in needle, anteromedial approach  Arthrogram: No  Medications (Right): 16.8 mg Sodium Hyaluronate (Viscosup) 16.8 MG/2ML Medications (Left): 16.8 mg Sodium Hyaluronate (Viscosup) 16.8 MG/2ML Outcome: tolerated well, no immediate complications Procedure, treatment alternatives, risks and benefits explained, specific risks discussed. Consent was given by the patient.      Clinical Data: No additional findings.   Subjective: Chief Complaint  Patient presents with   Left Knee - Follow-up   Right Knee - Follow-up  Patient presents today for the third Gelsyn injections bilaterally.  HPI  Review of Systems  All other systems  reviewed and are negative.   Objective: Vital Signs: There were no vitals taken for this visit.  Physical Exam Patient patient appears well normal respiratory effort Ortho Exam Bilateral knees she has no effusions no redness of cellulitis skin is in good condition. Specialty Comments:  No specialty comments available.  Imaging: No results found.   PMFS History: Patient Active Problem List   Diagnosis Date Noted   Low back pain 09/02/2021   Trigger finger, right middle finger 08/24/2021   Genetic testing 06/18/2021   Family history of breast cancer 05/26/2021   Family history of bone cancer 05/26/2021   Malignant neoplasm of upper-inner quadrant of left breast in female, estrogen receptor positive ([place]) 05/24/2021   Bilateral primary osteoarthritis of knee 11/12/2019   Obesity 11/12/2019   Chronic right shoulder pain 11/27/2017   Unilateral primary osteoarthritis, left knee 11/27/2017   Past Medical History:  Diagnosis Date   Anxiety    self reported   Breast cancer ([place])    Cataract    Family history of bone cancer    Family history of breast cancer    Hypertension     Family History  Problem Relation Age of Onset   Breast cancer Maternal Aunt        x5 aunts, dx >50   Breast cancer Paternal Aunt        dx >50   Breast  cancer Cousin        x4 maternal first cousins   Bone cancer Daughter 20    Past Surgical History:  Procedure Laterality Date   ABDOMINAL HYSTERECTOMY     BREAST LUMPECTOMY  1988   BREAST LUMPECTOMY WITH RADIOACTIVE SEED LOCALIZATION Left 06/15/2021   Procedure: LEFT BREAST LUMPECTOMY WITH RADIOACTIVE SEED LOCALIZATION;  Surgeon: Rolm Bookbinder, MD;  Location: [place];  Service: General;  Laterality: Left;   INTRAOCULAR PROSTHESES INSERTION  8/248/2005   Social History   Occupational History   Not on file  Tobacco Use   Smoking status: Never   Smokeless tobacco: Never  Vaping Use   Vaping Use: Never used   Substance and Sexual Activity   Alcohol use: Yes    Comment: rarely, social   Drug use: No   Sexual activity: Never

## 2021-09-14 ENCOUNTER — Encounter: Payer: BC Managed Care – PPO | Admitting: Rehabilitative and Restorative Service Providers"

## 2021-09-15 ENCOUNTER — Inpatient Hospital Stay: Payer: Medicare Other | Attending: Oncology

## 2021-09-15 ENCOUNTER — Other Ambulatory Visit: Payer: Self-pay

## 2021-09-15 ENCOUNTER — Inpatient Hospital Stay: Payer: Medicare Other

## 2021-09-15 ENCOUNTER — Other Ambulatory Visit: Payer: Self-pay | Admitting: Oncology

## 2021-09-15 VITALS — BP 137/66 | HR 78 | Temp 98.0°F | Resp 18 | Wt 214.6 lb

## 2021-09-15 DIAGNOSIS — E039 Hypothyroidism, unspecified: Secondary | ICD-10-CM | POA: Diagnosis not present

## 2021-09-15 DIAGNOSIS — Z170 Estrogen receptor positive status [ER+]: Secondary | ICD-10-CM | POA: Diagnosis not present

## 2021-09-15 DIAGNOSIS — E7800 Pure hypercholesterolemia, unspecified: Secondary | ICD-10-CM | POA: Diagnosis not present

## 2021-09-15 DIAGNOSIS — Z5112 Encounter for antineoplastic immunotherapy: Secondary | ICD-10-CM | POA: Diagnosis not present

## 2021-09-15 DIAGNOSIS — C50212 Malignant neoplasm of upper-inner quadrant of left female breast: Secondary | ICD-10-CM | POA: Insufficient documentation

## 2021-09-15 DIAGNOSIS — I10 Essential (primary) hypertension: Secondary | ICD-10-CM | POA: Diagnosis not present

## 2021-09-15 LAB — CBC WITH DIFFERENTIAL/PLATELET
Abs Immature Granulocytes: 0.01 10*3/uL (ref 0.00–0.07)
Basophils Absolute: 0.1 10*3/uL (ref 0.0–0.1)
Basophils Relative: 1 %
Eosinophils Absolute: 0.2 10*3/uL (ref 0.0–0.5)
Eosinophils Relative: 5 %
HCT: 33.6 % — ABNORMAL LOW (ref 36.0–46.0)
Hemoglobin: 11 g/dL — ABNORMAL LOW (ref 12.0–15.0)
Immature Granulocytes: 0 %
Lymphocytes Relative: 32 %
Lymphs Abs: 1.4 10*3/uL (ref 0.7–4.0)
MCH: 28.6 pg (ref 26.0–34.0)
MCHC: 32.7 g/dL (ref 30.0–36.0)
MCV: 87.5 fL (ref 80.0–100.0)
Monocytes Absolute: 0.5 10*3/uL (ref 0.1–1.0)
Monocytes Relative: 10 %
Neutro Abs: 2.4 10*3/uL (ref 1.7–7.7)
Neutrophils Relative %: 52 %
Platelets: 216 10*3/uL (ref 150–400)
RBC: 3.84 MIL/uL — ABNORMAL LOW (ref 3.87–5.11)
RDW: 14.6 % (ref 11.5–15.5)
WBC: 4.5 10*3/uL (ref 4.0–10.5)
nRBC: 0 % (ref 0.0–0.2)

## 2021-09-15 LAB — COMPREHENSIVE METABOLIC PANEL
ALT: 13 U/L (ref 0–44)
AST: 17 U/L (ref 15–41)
Albumin: 3.7 g/dL (ref 3.5–5.0)
Alkaline Phosphatase: 71 U/L (ref 38–126)
Anion gap: 9 (ref 5–15)
BUN: 24 mg/dL — ABNORMAL HIGH (ref 8–23)
CO2: 25 mmol/L (ref 22–32)
Calcium: 9.3 mg/dL (ref 8.9–10.3)
Chloride: 106 mmol/L (ref 98–111)
Creatinine, Ser: 0.97 mg/dL (ref 0.44–1.00)
GFR, Estimated: 57 mL/min — ABNORMAL LOW (ref >60)
Glucose, Bld: 136 mg/dL — ABNORMAL HIGH (ref 70–99)
Potassium: 3.6 mmol/L (ref 3.5–5.1)
Sodium: 140 mmol/L (ref 135–145)
Total Bilirubin: 0.4 mg/dL (ref 0.3–1.2)
Total Protein: 7.2 g/dL (ref 6.5–8.1)

## 2021-09-15 MED ORDER — ACETAMINOPHEN 325 MG PO TABS
650.0000 mg | ORAL_TABLET | Freq: Once | ORAL | Status: AC
  Administered 2021-09-15: 650 mg via ORAL
  Filled 2021-09-15: qty 2

## 2021-09-15 MED ORDER — TRASTUZUMAB-HYALURONIDASE-OYSK 600-10000 MG-UNT/5ML ~~LOC~~ SOLN
600.0000 mg | Freq: Once | SUBCUTANEOUS | Status: AC
  Administered 2021-09-15: 600 mg via SUBCUTANEOUS
  Filled 2021-09-15: qty 5

## 2021-09-15 MED ORDER — DIPHENHYDRAMINE HCL 25 MG PO CAPS
25.0000 mg | ORAL_CAPSULE | Freq: Once | ORAL | Status: AC
  Administered 2021-09-15: 25 mg via ORAL
  Filled 2021-09-15: qty 1

## 2021-09-15 NOTE — Patient Instructions (Signed)
[facility]ICAL [facility]COL[facility]  Discharge Instructions: Thank you for choosing [facility] to provide your oncology and hematology care.   If you have a lab appointment with the Cancer Center, please go directly to the Cancer Center and check in at the registration area.   Wear comfortable clothing and clothing appropriate for easy access to any Portacath or PICC line.   We strive to give you quality time with your provider. You may need to reschedule your appointment if you arrive late (15 or more minutes).  Arriving late affects you and other patients whose appointments are after yours.  Also, if you miss three or more appointments without notifying the office, you may be dismissed from the clinic at the provider's discretion.      For prescription refill requests, have your pharmacy contact our office and allow 72 hours for refills to be completed.    Today you received the following chemotherapy and/or immunotherapy agents herceptin hylecta    To help prevent nausea and vomiting after your treatment, we encourage you to take your nausea medication as directed.  BELOW ARE SYMPTOMS THAT SHOULD BE REPORTED IMMEDIATELY: . *FEVER GREATER THAN 100.4 F (38 C) OR HIGHER . *CHILLS OR SWEATING . *NAUSEA AND VOMITING THAT IS NOT CONTROLLED WITH YOUR NAUSEA MEDICATION . *UNUSUAL SHORTNESS OF BREATH . *UNUSUAL BRUISING OR BLEEDING . *URINARY PROBLEMS (pain or burning when urinating, or frequent urination) . *BOWEL PROBLEMS (unusual diarrhea, constipation, pain near the anus) . TENDERNESS IN MOUTH AND THROAT WITH OR WITHOUT PRESENCE OF ULCERS (sore throat, sores in mouth, or a toothache) . UNUSUAL RASH, SWELLING OR PAIN  . UNUSUAL VAGINAL DISCHARGE OR ITCHING   Items with * indicate a potential emergency and should be followed up as soon as possible or go to the Emergency Department if any problems should occur.  Please show the CHEMOTHERAPY ALERT CARD or IMMUNOTHERAPY  ALERT CARD at check-in to the Emergency Department and triage nurse.  Should you have questions after your visit or need to cancel or reschedule your appointment, please contact [facility]  Dept: [phone]  and follow the prompts.  Office hours are 8:00 a.m. to 4:30 p.m. Monday - Friday. Please note that voicemails left after 4:00 p.m. may not be returned until the following business day.  We are closed weekends and major holidays. You have access to a nurse at all times for urgent questions. Please call the main number to the clinic Dept: [phone] and follow the prompts.   For any non-urgent questions, you may also contact your provider using MyChart. We now offer e-Visits for anyone 18 and older to request care online for non-urgent symptoms. For details visit [email or web address].   Also download the MyChart app! Go to the app store, search "MyChart", open the app, select , and log in with your MyChart username and password.  Due to Covid, a mask is required upon entering the hospital/clinic. If you do not have a mask, one will be given to you upon arrival. For doctor visits, patients may have 1 support person aged 18 or older with them. For treatment visits, patients cannot have anyone with them due to current Covid guidelines and our immunocompromised population.   

## 2021-09-17 ENCOUNTER — Other Ambulatory Visit: Payer: Self-pay

## 2021-09-17 ENCOUNTER — Encounter: Payer: Self-pay | Admitting: Rehabilitative and Restorative Service Providers"

## 2021-09-17 ENCOUNTER — Ambulatory Visit (INDEPENDENT_AMBULATORY_CARE_PROVIDER_SITE_OTHER): Payer: Medicare Other | Admitting: Rehabilitative and Restorative Service Providers"

## 2021-09-17 DIAGNOSIS — G8929 Other chronic pain: Secondary | ICD-10-CM

## 2021-09-17 DIAGNOSIS — M5442 Lumbago with sciatica, left side: Secondary | ICD-10-CM | POA: Diagnosis not present

## 2021-09-17 DIAGNOSIS — M25562 Pain in left knee: Secondary | ICD-10-CM | POA: Diagnosis not present

## 2021-09-17 DIAGNOSIS — R262 Difficulty in walking, not elsewhere classified: Secondary | ICD-10-CM

## 2021-09-17 DIAGNOSIS — R293 Abnormal posture: Secondary | ICD-10-CM | POA: Diagnosis not present

## 2021-09-17 DIAGNOSIS — M6281 Muscle weakness (generalized): Secondary | ICD-10-CM | POA: Diagnosis not present

## 2021-09-17 DIAGNOSIS — M25561 Pain in right knee: Secondary | ICD-10-CM | POA: Diagnosis not present

## 2021-09-17 NOTE — Therapy (Addendum)
[facility] [address] [place], [place], [address] Phone: [phone]   Fax:  [phone]  Physical Therapy Treatment  Patient Details  Name: Jillian Buchanan MRN: [id number] Date of Birth: 1933/04/16 Referring Provider (PT): Garald Balding, MD   Encounter Date: 09/17/2021   PT End of Session - 09/17/21 1102     Visit Number 3    Number of Visits 20    Date for PT Re-Evaluation 11/15/21    Authorization Type BCBS State and [facility] Medicare    Progress Note Due on Visit 10    PT Start Time 1058    PT Stop Time 1138    PT Time Calculation (min) 40 min    Activity Tolerance Patient tolerated treatment well    Behavior During Therapy WFL for tasks assessed/performed             Past Medical History:  Diagnosis Date   Anxiety    self reported   Breast cancer ([place])    Cataract    Family history of bone cancer    Family history of breast cancer    Hypertension     Past Surgical History:  Procedure Laterality Date   ABDOMINAL HYSTERECTOMY     BREAST LUMPECTOMY  1988   BREAST LUMPECTOMY WITH RADIOACTIVE SEED LOCALIZATION Left 06/15/2021   Procedure: LEFT BREAST LUMPECTOMY WITH RADIOACTIVE SEED LOCALIZATION;  Surgeon: Rolm Bookbinder, MD;  Location: [place];  Service: General;  Laterality: Left;   INTRAOCULAR PROSTHESES INSERTION  8/248/2005    There were no vitals filed for this visit.   Subjective Assessment - 09/17/21 1103     Subjective Pt. indicated waking this morning c 9/10 pain shooting in Lt leg.  Improved some c rest and some HEP usage.    Pertinent History History of knee pain c recent injections. xray shows degenerative changes  HTN, history of breast cancer    Limitations Standing;Walking;House hold activities    Patient Stated Goals Reduce pain, be more active.    Currently in Pain? Yes    Pain Score 9     Pain Location Leg    Pain Orientation Left;Posterior    Pain Descriptors / Indicators  Sharp    Pain Type Chronic pain    Pain Onset More than a month ago    Pain Frequency Intermittent    Aggravating Factors  woke up aggravated while getting ready/making bed    Pain Relieving Factors rest, exercise at times.                [facility] PT Assessment - 09/17/21 0001       Assessment   Medical Diagnosis M54.40 (ICD-10-CM) - Low back pain with sciatica, sciatica laterality unspecified, unspecified back pain laterality, unspecified chronicity    Referring Provider (PT) Garald Balding, MD    Onset Date/Surgical Date 03/31/21    Hand Dominance Right      Special Tests   Other special tests (+) Slump on Lt still noted                           [facility] Adult PT Treatment/Exercise - 09/17/21 0001       Lumbar Exercises: Stretches   Lower Trunk Rotation 3 reps   15 sec x 3 bilateral   Figure 4 Stretch 30 seconds;3 reps;Supine   bilateral   Other Lumbar Stretch Exercise Sciatic nerve glide supine 2x10 each leg  Lumbar Exercises: Aerobic   Nustep Lvl 5 10 mins UE/LE      Lumbar Exercises: Supine   Bridge Other (comment)   2 x 10     Lumbar Exercises: Sidelying   Clam Both;Other (comment)   2x 15 bilateral                      PT Short Term Goals - 09/17/21 1115       PT SHORT TERM GOAL #1   Title Patient will demonstrate independent use of home exercise program to maintain progress from in clinic treatments.    Time 3    Period Weeks    Status On-going    Target Date 09/27/21               PT Long Term Goals - 09/06/21 1009       PT LONG TERM GOAL #1   Title Patient will demonstrate/report pain at worst less than or equal to 2/10 to facilitate minimal limitation in daily activity secondary to pain symptoms.    Time 10    Period Weeks    Status New    Target Date 11/15/21      PT LONG TERM GOAL #2   Title Patient will demonstrate independent use of home exercise program to facilitate ability to maintain/progress  functional gains from skilled physical therapy services.    Time 10    Period Weeks    Status New    Target Date 11/15/21      PT LONG TERM GOAL #3   Title Pt. will demonstrate FOTO outcome > or = 44 % to indicated reduced disability due to condition.    Time 10    Period Weeks    Status New    Target Date 11/15/21      PT LONG TERM GOAL #4   Title Patient will demonstrate lumbar extension 100 % WFL s symptoms to facilitate upright standing, walking posture at PLOF s limitation.    Time 10    Period Weeks    Status New    Target Date 11/15/21      PT LONG TERM GOAL #5   Title Pt. will demonstrate bilateral knee MMT 5/5 throughout to facilitate usual daily and household activity at PLOF.    Time 10    Period Weeks    Status New    Target Date 11/15/21                   Plan - 09/17/21 1116     Clinical Impression Statement Increased severity of morning symptoms in Lt leg improved through course of HEP use at home and continued improvement reported during in clinic intervention today.  Intervention appeared to be beneficial to this point so review of existing plan and performance given today with minimal changes with goals of continued improvement in mobility and symptom reduction.  Continued skilled PT services indicated at this time.    Personal Factors and Comorbidities Comorbidity 3+    Comorbidities HTN, bilateral chronic knee pain, history of breast cancer    Examination-Activity Limitations Sleep;Squat;Bend;Stairs;Stand;Transfers;Lift;Locomotion Level    Examination-Participation Restrictions Shop;Community Activity;Laundry;Cleaning    Stability/Clinical Decision Making Stable/Uncomplicated    Rehab Potential Good    PT Frequency Other (comment)   1-2x/week   PT Duration Other (comment)   10 weeks   PT Treatment/Interventions ADLs/Self Care Home Management;Electrical Stimulation;Cryotherapy;Iontophoresis 4mg /ml Dexamethasone;Moist Heat;Balance  training;Traction;Therapeutic exercise;Therapeutic activities;Functional mobility training;Stair training;Gait training;DME Instruction;Ultrasound;Neuromuscular  re-education;Patient/family education;Passive range of motion;Spinal Manipulations;Joint Manipulations;Dry needling;Taping;Manual techniques    PT Next Visit Plan Continue plan on improving neuro and lumbar mobility, progressive activity tolerance c strength program as tolerated.    PT Home Exercise Plan [id number]    Consulted and Agree with Plan of Care Patient             Patient will benefit from skilled therapeutic intervention in order to improve the following deficits and impairments:  Abnormal gait, Hypomobility, Decreased activity tolerance, Decreased strength, Pain, Difficulty walking, Decreased mobility, Decreased balance, Decreased range of motion, Impaired perceived functional ability, Improper body mechanics, Postural dysfunction, Impaired flexibility, Decreased coordination  Visit Diagnosis: Chronic bilateral low back pain with left-sided sciatica  Muscle weakness (generalized)  Difficulty in walking, not elsewhere classified  Abnormal posture  Chronic pain of left knee  Chronic pain of right knee     Problem List Patient Active Problem List   Diagnosis Date Noted   Low back pain 09/02/2021   Trigger finger, right middle finger 08/24/2021   Genetic testing 06/18/2021   Family history of breast cancer 05/26/2021   Family history of bone cancer 05/26/2021   Malignant neoplasm of upper-inner quadrant of left breast in female, estrogen receptor positive ([place]) 05/24/2021   Bilateral primary osteoarthritis of knee 11/12/2019   Obesity 11/12/2019   Chronic right shoulder pain 11/27/2017   Unilateral primary osteoarthritis, left knee 11/27/2017    Scot Jun, PT, DPT, OCS, ATC 09/17/21  11:48 AM    [facility] [address] [place], [place], [address] Phone:  [phone]   Fax:  [phone]  Name: Jillian Buchanan MRN: [id number] Date of Birth: August 20, 1933

## 2021-09-21 ENCOUNTER — Encounter: Payer: BC Managed Care – PPO | Admitting: Rehabilitative and Restorative Service Providers"

## 2021-09-22 ENCOUNTER — Encounter: Payer: Self-pay | Admitting: Oncology

## 2021-09-28 ENCOUNTER — Encounter: Payer: BC Managed Care – PPO | Admitting: Physical Therapy

## 2021-10-01 ENCOUNTER — Other Ambulatory Visit: Payer: Self-pay

## 2021-10-01 ENCOUNTER — Ambulatory Visit (INDEPENDENT_AMBULATORY_CARE_PROVIDER_SITE_OTHER): Payer: Medicare Other | Admitting: Physical Therapy

## 2021-10-01 DIAGNOSIS — M6281 Muscle weakness (generalized): Secondary | ICD-10-CM

## 2021-10-01 DIAGNOSIS — R293 Abnormal posture: Secondary | ICD-10-CM

## 2021-10-01 DIAGNOSIS — R262 Difficulty in walking, not elsewhere classified: Secondary | ICD-10-CM

## 2021-10-01 DIAGNOSIS — M5442 Lumbago with sciatica, left side: Secondary | ICD-10-CM

## 2021-10-01 DIAGNOSIS — G8929 Other chronic pain: Secondary | ICD-10-CM

## 2021-10-01 NOTE — Therapy (Signed)
[facility] Physical Therapy [address] [place], [place], [address] Phone: [phone]   Fax:  [phone]  Physical Therapy Treatment  Patient Details  Name: Jillian Buchanan MRN: [id number] Date of Birth: [birth year] Referring Provider (PT): Garald Balding, MD   Encounter Date: 10/01/2021   PT End of Session - 10/01/21 1041     Visit Number 4    Number of Visits 20    Date for PT Re-Evaluation 11/15/21    Authorization Type BCBS State and [facility] Medicare    Progress Note Due on Visit 10    PT Start Time 1015    PT Stop Time 1055    PT Time Calculation (min) 40 min    Activity Tolerance Patient tolerated treatment well    Behavior During Therapy WFL for tasks assessed/performed             Past Medical History:  Diagnosis Date   Anxiety    self reported   Breast cancer ([place])    Cataract    Family history of bone cancer    Family history of breast cancer    Hypertension     Past Surgical History:  Procedure Laterality Date   ABDOMINAL HYSTERECTOMY     BREAST LUMPECTOMY  1988   BREAST LUMPECTOMY WITH RADIOACTIVE SEED LOCALIZATION Left 06/15/2021   Procedure: LEFT BREAST LUMPECTOMY WITH RADIOACTIVE SEED LOCALIZATION;  Surgeon: Rolm Bookbinder, MD;  Location: [place];  Service: General;  Laterality: Left;   INTRAOCULAR PROSTHESES INSERTION  8/248/2005    There were no vitals filed for this visit.   Subjective Assessment - 10/01/21 1031     Subjective relays the pain is not too bad today, about 4/10 in her left posterior hip and buttock that shoots down her leg    Pertinent History History of knee pain c recent injections. xray shows degenerative changes  HTN, history of breast cancer    Limitations Standing;Walking;House hold activities    Patient Stated Goals Reduce pain, be more active.    Pain Onset More than a month ago              [facility] Adult PT Treatment/Exercise - 10/01/21 0001       Lumbar Exercises:  Stretches   Lower Trunk Rotation 5 reps;10 seconds    Figure 4 Stretch 3 reps;30 seconds    Figure 4 Stretch Limitations 3 reps on Lt, 2 reps on Rt    Other Lumbar Stretch Exercise Sciatic nerve glide supine holding 3 sec X 10 on Lt      Lumbar Exercises: Aerobic   Nustep Lvl 5 10 mins UE/LE      Lumbar Exercises: Standing    --     --            Other Standing Lumbar Exercises bilat hip extension X 10 bilat      Lumbar Exercises: Supine   Bridge 10 reps;5 seconds    Bridge Limitations 2 sets      Manual Therapy   Soft tissue mobilization TPR and STM of glute and piriformis combined with thera gun                       PT Short Term Goals - 09/17/21 1115       PT SHORT TERM GOAL #1   Title Patient will demonstrate independent use of home exercise program to maintain progress from in clinic treatments.    Time 3  Period Weeks    Status On-going    Target Date 09/27/21               PT Long Term Goals - 09/06/21 1009       PT LONG TERM GOAL #1   Title Patient will demonstrate/report pain at worst less than or equal to 2/10 to facilitate minimal limitation in daily activity secondary to pain symptoms.    Time 10    Period Weeks    Status New    Target Date 11/15/21      PT LONG TERM GOAL #2   Title Patient will demonstrate independent use of home exercise program to facilitate ability to maintain/progress functional gains from skilled physical therapy services.    Time 10    Period Weeks    Status New    Target Date 11/15/21      PT LONG TERM GOAL #3   Title Pt. will demonstrate FOTO outcome > or = 44 % to indicated reduced disability due to condition.    Time 10    Period Weeks    Status New    Target Date 11/15/21      PT LONG TERM GOAL #4   Title Patient will demonstrate lumbar extension 100 % WFL s symptoms to facilitate upright standing, walking posture at PLOF s limitation.    Time 10    Period Weeks    Status New    Target Date  11/15/21      PT LONG TERM GOAL #5   Title Pt. will demonstrate bilateral knee MMT 5/5 throughout to facilitate usual daily and household activity at PLOF.    Time 10    Period Weeks    Status New    Target Date 11/15/21                   Plan - 10/01/21 1057     Clinical Impression Statement We continued with her lumber exercises and stretches and sciative nerve glides. I trialed manual therapy today for trigger point release and thera gun to Lt piriformis and glutes today. We will asses her response to this next visit.    Personal Factors and Comorbidities Comorbidity 3+    Comorbidities HTN, bilateral chronic knee pain, history of breast cancer    Examination-Activity Limitations Sleep;Squat;Bend;Stairs;Stand;Transfers;Lift;Locomotion Level    Examination-Participation Restrictions Shop;Community Activity;Laundry;Cleaning    Stability/Clinical Decision Making Stable/Uncomplicated    Rehab Potential Good    PT Frequency Other (comment)   1-2x/week   PT Duration Other (comment)   10 weeks   PT Treatment/Interventions ADLs/Self Care Home Management;Electrical Stimulation;Cryotherapy;Iontophoresis 4mg /ml Dexamethasone;Moist Heat;Balance training;Traction;Therapeutic exercise;Therapeutic activities;Functional mobility training;Stair training;Gait training;DME Instruction;Ultrasound;Neuromuscular re-education;Patient/family education;Passive range of motion;Spinal Manipulations;Joint Manipulations;Dry needling;Taping;Manual techniques    PT Next Visit Plan Continue plan on improving neuro and lumbar mobility, progressive activity tolerance c strength program as tolerated.    PT Home Exercise Plan [email or web address]    Consulted and Agree with Plan of Care Patient             Patient will benefit from skilled therapeutic intervention in order to improve the following deficits and impairments:  Abnormal gait, Hypomobility, Decreased activity tolerance, Decreased strength, Pain, Difficulty  walking, Decreased mobility, Decreased balance, Decreased range of motion, Impaired perceived functional ability, Improper body mechanics, Postural dysfunction, Impaired flexibility, Decreased coordination  Visit Diagnosis: Chronic bilateral low back pain with left-sided sciatica  Muscle weakness (generalized)  Difficulty in walking, not elsewhere classified  Abnormal posture  Problem List Patient Active Problem List   Diagnosis Date Noted   Low back pain 09/02/2021   Trigger finger, right middle finger 08/24/2021   Genetic testing 06/18/2021   Family history of breast cancer 05/26/2021   Family history of bone cancer 05/26/2021   Malignant neoplasm of upper-inner quadrant of left breast in female, estrogen receptor positive ([place]) 05/24/2021   Bilateral primary osteoarthritis of knee 11/12/2019   Obesity 11/12/2019   Chronic right shoulder pain 11/27/2017   Unilateral primary osteoarthritis, left knee 11/27/2017    Debbe Odea, PT,DPT 10/01/2021, 11:01 AM  [facility] Physical Therapy [address] [place], [place], [address] Phone: [phone]   Fax:  [phone]  Name: Jillian Buchanan MRN: [id number] Date of Birth: December 13, 1932

## 2021-10-05 ENCOUNTER — Encounter: Payer: BC Managed Care – PPO | Admitting: Rehabilitative and Restorative Service Providers"

## 2021-10-06 ENCOUNTER — Inpatient Hospital Stay (HOSPITAL_BASED_OUTPATIENT_CLINIC_OR_DEPARTMENT_OTHER): Payer: Medicare Other | Admitting: Oncology

## 2021-10-06 ENCOUNTER — Telehealth: Payer: Self-pay | Admitting: *Deleted

## 2021-10-06 ENCOUNTER — Other Ambulatory Visit: Payer: Self-pay

## 2021-10-06 ENCOUNTER — Inpatient Hospital Stay: Payer: Medicare Other

## 2021-10-06 ENCOUNTER — Inpatient Hospital Stay: Payer: Medicare Other | Attending: Oncology

## 2021-10-06 ENCOUNTER — Encounter: Payer: Self-pay | Admitting: *Deleted

## 2021-10-06 VITALS — BP 141/75 | HR 72 | Resp 17

## 2021-10-06 VITALS — BP 149/68 | HR 78 | Temp 97.3°F | Resp 18 | Ht 64.0 in | Wt 215.4 lb

## 2021-10-06 DIAGNOSIS — Z170 Estrogen receptor positive status [ER+]: Secondary | ICD-10-CM

## 2021-10-06 DIAGNOSIS — C50212 Malignant neoplasm of upper-inner quadrant of left female breast: Secondary | ICD-10-CM

## 2021-10-06 DIAGNOSIS — Z5112 Encounter for antineoplastic immunotherapy: Secondary | ICD-10-CM | POA: Insufficient documentation

## 2021-10-06 LAB — CBC WITH DIFFERENTIAL/PLATELET
Abs Immature Granulocytes: 0.01 10*3/uL (ref 0.00–0.07)
Basophils Absolute: 0 10*3/uL (ref 0.0–0.1)
Basophils Relative: 1 %
Eosinophils Absolute: 0.2 10*3/uL (ref 0.0–0.5)
Eosinophils Relative: 4 %
HCT: 33.5 % — ABNORMAL LOW (ref 36.0–46.0)
Hemoglobin: 11 g/dL — ABNORMAL LOW (ref 12.0–15.0)
Immature Granulocytes: 0 %
Lymphocytes Relative: 30 %
Lymphs Abs: 1.2 10*3/uL (ref 0.7–4.0)
MCH: 28.6 pg (ref 26.0–34.0)
MCHC: 32.8 g/dL (ref 30.0–36.0)
MCV: 87 fL (ref 80.0–100.0)
Monocytes Absolute: 0.5 10*3/uL (ref 0.1–1.0)
Monocytes Relative: 11 %
Neutro Abs: 2.2 10*3/uL (ref 1.7–7.7)
Neutrophils Relative %: 54 %
Platelets: 217 10*3/uL (ref 150–400)
RBC: 3.85 MIL/uL — ABNORMAL LOW (ref 3.87–5.11)
RDW: 14.7 % (ref 11.5–15.5)
WBC: 4.1 10*3/uL (ref 4.0–10.5)
nRBC: 0 % (ref 0.0–0.2)

## 2021-10-06 LAB — COMPREHENSIVE METABOLIC PANEL
ALT: 15 U/L (ref 0–44)
AST: 16 U/L (ref 15–41)
Albumin: 3.7 g/dL (ref 3.5–5.0)
Alkaline Phosphatase: 70 U/L (ref 38–126)
Anion gap: 11 (ref 5–15)
BUN: 20 mg/dL (ref 8–23)
CO2: 22 mmol/L (ref 22–32)
Calcium: 9 mg/dL (ref 8.9–10.3)
Chloride: 108 mmol/L (ref 98–111)
Creatinine, Ser: 0.89 mg/dL (ref 0.44–1.00)
GFR, Estimated: >60 mL/min (ref >60)
Glucose, Bld: 110 mg/dL — ABNORMAL HIGH (ref 70–99)
Potassium: 3.6 mmol/L (ref 3.5–5.1)
Sodium: 141 mmol/L (ref 135–145)
Total Bilirubin: 0.5 mg/dL (ref 0.3–1.2)
Total Protein: 7.4 g/dL (ref 6.5–8.1)

## 2021-10-06 MED ORDER — DIPHENHYDRAMINE HCL 25 MG PO CAPS
25.0000 mg | ORAL_CAPSULE | Freq: Once | ORAL | Status: AC
  Administered 2021-10-06: 25 mg via ORAL
  Filled 2021-10-06: qty 1

## 2021-10-06 MED ORDER — TRASTUZUMAB-HYALURONIDASE-OYSK 600-10000 MG-UNT/5ML ~~LOC~~ SOLN
600.0000 mg | Freq: Once | SUBCUTANEOUS | Status: AC
  Administered 2021-10-06: 600 mg via SUBCUTANEOUS
  Filled 2021-10-06: qty 5

## 2021-10-06 MED ORDER — ACETAMINOPHEN 325 MG PO TABS
650.0000 mg | ORAL_TABLET | Freq: Once | ORAL | Status: AC
  Administered 2021-10-06: 650 mg via ORAL
  Filled 2021-10-06: qty 2

## 2021-10-06 NOTE — Telephone Encounter (Signed)
Per MD visit and assessment today - order stated to proceed with treatment today with Echo from 06/07/2021.  Informed treatment room nurse.

## 2021-10-06 NOTE — Patient Instructions (Signed)
[facility] MEDICAL ONCOLOGY  Discharge Instructions: Thank you for choosing [facility] to provide your oncology and hematology care.   If you have a lab appointment with the Cancer Center, please go directly to the Cancer Center and check in at the registration area.   Wear comfortable clothing and clothing appropriate for easy access to any Portacath or PICC line.   We strive to give you quality time with your provider. You may need to reschedule your appointment if you arrive late (15 or more minutes).  Arriving late affects you and other patients whose appointments are after yours.  Also, if you miss three or more appointments without notifying the office, you may be dismissed from the clinic at the provider's discretion.      For prescription refill requests, have your pharmacy contact our office and allow 72 hours for refills to be completed.    Today you received the following chemotherapy and/or immunotherapy agents: Herceptin Hylecta.     To help prevent nausea and vomiting after your treatment, we encourage you to take your nausea medication as directed.  BELOW ARE SYMPTOMS THAT SHOULD BE REPORTED IMMEDIATELY: . *FEVER GREATER THAN 100.4 F (38 C) OR HIGHER . *CHILLS OR SWEATING . *NAUSEA AND VOMITING THAT IS NOT CONTROLLED WITH YOUR NAUSEA MEDICATION . *UNUSUAL SHORTNESS OF BREATH . *UNUSUAL BRUISING OR BLEEDING . *URINARY PROBLEMS (pain or burning when urinating, or frequent urination) . *BOWEL PROBLEMS (unusual diarrhea, constipation, pain near the anus) . TENDERNESS IN MOUTH AND THROAT WITH OR WITHOUT PRESENCE OF ULCERS (sore throat, sores in mouth, or a toothache) . UNUSUAL RASH, SWELLING OR PAIN  . UNUSUAL VAGINAL DISCHARGE OR ITCHING   Items with * indicate a potential emergency and should be followed up as soon as possible or go to the Emergency Department if any problems should occur.  Please show the CHEMOTHERAPY ALERT CARD or  IMMUNOTHERAPY ALERT CARD at check-in to the Emergency Department and triage nurse.  Should you have questions after your visit or need to cancel or reschedule your appointment, please contact [facility] MEDICAL ONCOLOGY  Dept: [phone]  and follow the prompts.  Office hours are 8:00 a.m. to 4:30 p.m. Monday - Friday. Please note that voicemails left after 4:00 p.m. may not be returned until the following business day.  We are closed weekends and major holidays. You have access to a nurse at all times for urgent questions. Please call the main number to the clinic Dept: [phone] and follow the prompts.   For any non-urgent questions, you may also contact your provider using MyChart. We now offer e-Visits for anyone 18 and older to request care online for non-urgent symptoms. For details visit [email or web address].   Also download the MyChart app! Go to the app store, search "MyChart", open the app, select , and log in with your MyChart username and password.  Due to Covid, a mask is required upon entering the hospital/clinic. If you do not have a mask, one will be given to you upon arrival. For doctor visits, patients may have 1 support person aged 18 or older with them. For treatment visits, patients cannot have anyone with them due to current Covid guidelines and our immunocompromised population.   

## 2021-10-06 NOTE — Progress Notes (Signed)
Jillian Buchanan  [phone] [phone]    ID: TAURUS WILLIS DOB: 29-May-1933  MR#: [id number]  WUG#:[id number]  Patient Care Team: Seward Carol, MD as PCP - General (Internal Medicine) Mauro Kaufmann, RN as Oncology Nurse Navigator Rockwell Germany, RN as Oncology Nurse Navigator Rolm Bookbinder, MD as Consulting Physician (General Surgery) Miken Stecher, Virgie Dad, MD as Consulting Physician (Oncology) Eppie Gibson, MD as Attending Physician (Radiation Oncology) Garald Balding, MD as Consulting Physician (Orthopedic Surgery) Shon Hough, MD as Consulting Physician (Ophthalmology) Bensimhon, Shaune Pascal, MD as Consulting Physician (Cardiology) Chauncey Cruel, MD OTHER MD:  CHIEF COMPLAINT: Triple positive breast cancer  CURRENT TREATMENT: Anastrozole, trastuzumab/[Hylecta]   INTERVAL HISTORY: [name] returns today for follow up of Jillian Buchanan triple positive breast cancer.  She is accompanied by Jillian Buchanan husband.  She started anastrozole on 07/07/2021.  She is tolerating this well.  Hot flashes are mild.  Vaginal dryness is not an issue.   She also started trastuzumab on 07/15/2021.  She receives this peripherally and does not have a port.  The plan is for total of 6 months on this medication although a 1 year could be considered.  Jillian Buchanan most recent echo was 06/07/2021 showing an ejection fraction of 55%.   REVIEW OF SYSTEMS: Narely is tolerating treatment well and in particular has no side effects from the trastuzumab.  [facility].  A detailed review of systems today was otherwise noncontributory   COVID 19 VACCINATION STATUS: Status post Pfizer x3 as of July 2022   HISTORY OF CURRENT ILLNESS: From the original intake note:  Jillian Buchanan has a history of prior left breast cancer. She underwent left lumpectomy with node dissection in 07/1986 and received radiation therapy followed by tamoxifen  for 5 years.  More recently, she had routine screening mammography on 04/27/2021 showing a possible abnormality in the left breast. She underwent left diagnostic mammography with tomography and left breast ultrasonography at [facility] on 05/12/2021 showing: breast density category B; 0.8 cm lobulated mass in left breast at 9-10 o'clock; no significant left axilla abnormalities.  Accordingly on 05/18/2021 she proceeded to biopsy of the left breast area in question. The pathology from this procedure ([id number]) showed: invasive ductal carcinoma, grade 2; ductal carcinoma in situ. Prognostic indicators significant for: estrogen receptor, 100% positive and progesterone receptor, 2% positive, both with strong staining intensity. Proliferation marker Ki67 at 15%. HER2 equivocal by immunohistochemistry (2+), but positive by fluorescent in situ hybridization with a signals ratio 2 and number per cell 4.15.   Cancer Staging  Malignant neoplasm of upper-inner quadrant of left breast in female, estrogen receptor positive ([place]) Staging form: Breast, AJCC 8th Edition - Clinical stage from 05/26/2021: Stage IA (cT1b, cN0, cM0, G2, ER+, PR+, HER2+) - Signed by Chauncey Cruel, MD on 05/26/2021 Stage prefix: Initial diagnosis Histologic grading system: 3 grade system Laterality: Left Staged by: Pathologist and managing physician Stage used in treatment planning: Yes National guidelines used in treatment planning: Yes Type of national guideline used in treatment planning: NCCN  The patient's subsequent history is as detailed below.   PAST MEDICAL HISTORY: Past Medical History:  Diagnosis Date   Anxiety    self reported   Breast cancer ([place])    Cataract    Family history of bone cancer    Family history of breast cancer    Hypertension     PAST SURGICAL HISTORY: Past Surgical History:  Procedure Laterality Date   ABDOMINAL HYSTERECTOMY     BREAST LUMPECTOMY  1988   BREAST LUMPECTOMY WITH RADIOACTIVE  SEED LOCALIZATION Left 06/15/2021   Procedure: LEFT BREAST LUMPECTOMY WITH RADIOACTIVE SEED LOCALIZATION;  Surgeon: Rolm Bookbinder, MD;  Location: [place];  Service: General;  Laterality: Left;   INTRAOCULAR PROSTHESES INSERTION  8/248/2005    FAMILY HISTORY: Family History  Problem Relation Age of Onset   Breast cancer Maternal Aunt        x5 aunts, dx >50   Breast cancer Paternal Aunt        dx >50   Breast cancer Cousin        x4 maternal first cousins   Bone cancer Daughter 61   Jillian Buchanan father died at age 74 from CVA. Jillian Buchanan mother died at age 23. Kenadee had two brothers. She reports breast cancer in five maternal aunts and one paternal aunt. Aside from those aunts, there is no additional family history of cancer to Jillian Buchanan knowledge.   GYNECOLOGIC HISTORY:  No LMP recorded. Patient has had a hysterectomy. Menarche: 87 years old Age at first live birth: 87 years old Jillian Buchanan 2 LMP 1977, with hysterectomy Contraceptive: used for 5 years without issue HRT used for 5 years  Hysterectomy? Yes, 1977 BSO?  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 Husband [name] is retired from working as a Environmental consultant for [employer]. Son Philbert Brooke Bonito "Fraser Din," age 30, is a Corporate investment banker here in [place]. Daughter [name] died at age 66 on 05-11-2016 shortly after being diagnosed with cancer.    ADVANCED DIRECTIVES: in place   HEALTH MAINTENANCE: Social History   Tobacco Use   Smoking status: Never   Smokeless tobacco: Never  Vaping Use   Vaping Use: Never used  Substance Use Topics   Alcohol use: Yes    Comment: rarely, social   Drug use: No     Colonoscopy: never done  PAP: 2014  Bone density: 05-11-2021   No Known Allergies  Current Outpatient Medications  Medication Sig Dispense Refill   acetaminophen (TYLENOL) 325 MG tablet      ALPRAZolam (XANAX) 0.5 MG tablet  TAKE 1 TABLET BY MOUTH EVERY DAY AS NEEDED FOR ANXIETY  3   amLODipine (NORVASC) 5 MG tablet Take 5 mg by mouth daily.       anastrozole (ARIMIDEX) 1 MG tablet Take 1 tablet (1 mg total) by mouth daily. 90 tablet 4   aspirin 81 MG EC tablet TAKE ONE (1) TABLET EACH DAY  3   bumetanide (BUMEX) 0.5 MG tablet Take 0.5 mg by mouth daily.       hydroquinone 4 % cream AS DIRECTED TO AFFECTED AREA TWICE A DAY AS NEEDED EXTERNALLY     levothyroxine (SYNTHROID, LEVOTHROID) 88 MCG tablet      losartan (COZAAR) 100 MG tablet      naproxen sodium (ALEVE) 220 MG tablet      rosuvastatin (CRESTOR) 5 MG tablet Take 5 mg by mouth daily.     triamcinolone cream (KENALOG) 0.5 % APPLY TO AFFECTED AREA OF LEG TWICE A DAY AS NEEDED  3   No current facility-administered medications for this visit.    OBJECTIVE: African-American woman in no acute distress  Vitals:   10/06/21 1312  BP: (!) 149/68  Pulse: 78  Resp: 18  Temp: (!) 97.3 F (36.3 C)  SpO2:  100%     Body mass index is 36.97 kg/m.   Wt Readings from Last 3 Encounters:  10/06/21 215 lb 6.4 oz (97.7 kg)  09/15/21 214 lb 9 oz (97.3 kg)  08/25/21 220 lb 4 oz (99.9 kg)     ECOG FS:1 - Symptomatic but completely ambulatory  Sclerae unicteric, EOMs intact Wearing a mask No cervical or supraclavicular adenopathy Lungs no rales or rhonchi Heart regular rate and rhythm Abd soft, nontender, positive bowel sounds MSK no focal spinal tenderness, no upper extremity lymphedema Neuro: nonfocal, well oriented, appropriate affect Breasts: The right breast is unremarkable.  The left breast has undergone lumpectomy.  There is no evidence of disease recurrence.  Both axillae are benign.   LAB RESULTS:  CMP     Component Value Date/Time   NA 141 10/06/2021 1254   K 3.6 10/06/2021 1254   CL 108 10/06/2021 1254   CO2 22 10/06/2021 1254   GLUCOSE 110 (H) 10/06/2021 1254   BUN 20 10/06/2021 1254   CREATININE 0.89 10/06/2021 1254   CREATININE 0.89  05/26/2021 0854   CALCIUM 9.0 10/06/2021 1254   PROT 7.4 10/06/2021 1254   ALBUMIN 3.7 10/06/2021 1254   AST 16 10/06/2021 1254   AST 14 (L) 05/26/2021 0854   ALT 15 10/06/2021 1254   ALT 8 05/26/2021 0854   ALKPHOS 70 10/06/2021 1254   BILITOT 0.5 10/06/2021 1254   BILITOT 0.4 05/26/2021 0854   GFRNONAA >60 10/06/2021 1254   GFRNONAA >60 05/26/2021 0854    No results found for: TOTALPROTELP, ALBUMINELP, A1GS, A2GS, BETS, BETA2SER, GAMS, MSPIKE, SPEI  Lab Results  Component Value Date   WBC 4.1 10/06/2021   NEUTROABS 2.2 10/06/2021   HGB 11.0 (L) 10/06/2021   HCT 33.5 (L) 10/06/2021   MCV 87.0 10/06/2021   PLT 217 10/06/2021    No results found for: LABCA2  No components found for: IRJJOA416  No results for input(s): INR in the last 168 hours.  No results found for: LABCA2  No results found for: SAY301  No results found for: SWF093  No results found for: ATF573  No results found for: CA2729  No components found for: HGQUANT  No results found for: CEA1 / No results found for: CEA1   No results found for: AFPTUMOR  No results found for: CHROMOGRNA  No results found for: KPAFRELGTCHN, LAMBDASER, KAPLAMBRATIO (kappa/lambda light chains)  No results found for: HGBA, HGBA2QUANT, HGBFQUANT, HGBSQUAN (Hemoglobinopathy evaluation)   Lab Results  Component Value Date   LDH 167 09/13/2010    No results found for: IRON, TIBC, IRONPCTSAT (Iron and TIBC)  No results found for: FERRITIN  Urinalysis No results found for: COLORURINE, APPEARANCEUR, LABSPEC, PHURINE, GLUCOSEU, HGBUR, BILIRUBINUR, KETONESUR, PROTEINUR, UROBILINOGEN, NITRITE, LEUKOCYTESUR   STUDIES: No results found.   ELIGIBLE FOR AVAILABLE RESEARCH PROTOCOL: no  ASSESSMENT: 87 y.o. [place] woman status post left breast upper inner quadrant biopsy 05/18/2021 for a clinical T1b N0, stage IA invasive ductal carcinoma, triple positive, with an MIB-1 of 15%.  (1) genetics testing 06/18/2021  through the he [facility]-Expanded + RNAinsight panel found no deleterious mutations in AIP, ALK, APC, ATM, AXIN2, BAP1, BARD1, BLM, BMPR1A, BRCA1, BRCA2, BRIP1, CDC73, CDH1, CDK4, CDKN1B, CDKN2A, CHEK2, CTNNA1, DICER1, FANCC, FH, FLCN, GALNT12, KIF1B, LZTR1, MAX, MEN1, MET, MLH1, MSH2, MSH3, MSH6, MUTYH, NBN, NF1, NF2, NTHL1, PALB2, PHOX2B, PMS2, POT1, PRKAR1A, PTCH1, PTEN, RAD51C, RAD51D, RB1, RECQL, RET, SDHA, SDHAF2, SDHB, SDHC, SDHD, SMAD4, SMARCA4, SMARCB1, SMARCE1, STK11, SUFU, TMEM127,  TP53, TSC1, TSC2, VHL and XRCC2 (sequencing and deletion/duplication); EGFR, EGLN1, HOXB13, KIT, MITF, PDGFRA, POLD1 and POLE (sequencing only); EPCAM and GREM1 (deletion/duplication only). RNA data is routinely analyzed for use in variant interpretation for all genes (A)  Three variants of uncertain significance (VUS) were detected - one in the ATM gene called Buchanan.F1265L (c.3793T>C), a second in the LTZR1 gene called c.2414delA (N.O709GGE*3), and a third in the [place] gene called Buchanan.P58L (c.173C>T).  (2) status post left lumpectomy 06/15/2021 for a pT1c pNX invasive ductal carcinoma grade 2, with negative margins  (3) trastuzumab every 3 weeks for 6 months started 07/15/2021  (A) echo 06/07/2021 shows EF of 60%  (4) anastrozole started 07/12/2021  (A) bone density at [facility] 04/27/2021 showed a T score of -0.4 (normal).   PLAN: Cathryne is now 4 months from Jillian Buchanan left lumpectomy.  She is tolerating anastrozole well and the plan is to continue that for 5 years.  She is also doing well on trastuzumab, with no side effects that she is aware of.  The plan is to continue that for 6 months.  She is overdue for Jillian Buchanan 53-monthecho and I have entered those orders.  She will have Jillian Buchanan new baseline mammogram in February 2023 and she will see [place] shortly after that.  She knows to call u[place]oreafor any other issue that may develop before then.  Total encounter time 20 minutes.*Sarajane JewsC. Nazair Fortenberry, MD 10/07/2021 8:34  AM Medical Oncology and Hematology [facility] [address]Tel. [phone]   Fax. [phone]  This document serves as a record of services personally performed by GLurline Del MD. It was created on his behalf by KWilburn Mylar a trained medical scribe. The creation of this record is based on the scribe's personal observations and the provider's statements to them.   I, GLurline DelMD, have reviewed the above documentation for accuracy and completeness, and I agree with the above.   *Total Encounter Time as defined by the Centers for Medicare and Medicaid Services includes, in addition to the face-to-face time of a patient visit (documented in the note above) non-face-to-face time: obtaining and reviewing outside history, ordering and reviewing medications, tests or procedures, care coordination (communications with other health care professionals or caregivers) and documentation in the medical record.

## 2021-10-07 ENCOUNTER — Encounter: Payer: Self-pay | Admitting: Oncology

## 2021-10-08 ENCOUNTER — Other Ambulatory Visit: Payer: Self-pay

## 2021-10-08 ENCOUNTER — Encounter: Payer: Self-pay | Admitting: Rehabilitative and Restorative Service Providers"

## 2021-10-08 ENCOUNTER — Ambulatory Visit (INDEPENDENT_AMBULATORY_CARE_PROVIDER_SITE_OTHER): Payer: Medicare Other | Admitting: Rehabilitative and Restorative Service Providers"

## 2021-10-08 DIAGNOSIS — R293 Abnormal posture: Secondary | ICD-10-CM | POA: Diagnosis not present

## 2021-10-08 DIAGNOSIS — M25561 Pain in right knee: Secondary | ICD-10-CM

## 2021-10-08 DIAGNOSIS — M25562 Pain in left knee: Secondary | ICD-10-CM | POA: Diagnosis not present

## 2021-10-08 DIAGNOSIS — R262 Difficulty in walking, not elsewhere classified: Secondary | ICD-10-CM | POA: Diagnosis not present

## 2021-10-08 DIAGNOSIS — M6281 Muscle weakness (generalized): Secondary | ICD-10-CM | POA: Diagnosis not present

## 2021-10-08 DIAGNOSIS — G8929 Other chronic pain: Secondary | ICD-10-CM | POA: Diagnosis not present

## 2021-10-08 DIAGNOSIS — M5442 Lumbago with sciatica, left side: Secondary | ICD-10-CM | POA: Diagnosis not present

## 2021-10-08 NOTE — Therapy (Addendum)
[facility] [address] [place], [place], [address] Phone: [phone]   Fax:  [phone]  Physical Therapy Treatment /Discharge Summary  Patient Details  Name: Jillian Buchanan MRN: [id number] Date of Birth: Jul 21, 1933 Referring Provider (PT): Garald Balding, MD   Encounter Date: 10/08/2021   PT End of Session - 10/08/21 1015     Visit Number 5    Number of Visits 20    Date for PT Re-Evaluation 11/15/21    Authorization Type [employer] State and [facility] Medicare    Progress Note Due on Visit 10    PT Start Time 1015    PT Stop Time 1056    PT Time Calculation (min) 41 min    Activity Tolerance Patient tolerated treatment well    Behavior During Therapy WFL for tasks assessed/performed             Past Medical History:  Diagnosis Date   Anxiety    self reported   Breast cancer ([place])    Cataract    Family history of bone cancer    Family history of breast cancer    Hypertension     Past Surgical History:  Procedure Laterality Date   ABDOMINAL HYSTERECTOMY     BREAST LUMPECTOMY  1988   BREAST LUMPECTOMY WITH RADIOACTIVE SEED LOCALIZATION Left 06/15/2021   Procedure: LEFT BREAST LUMPECTOMY WITH RADIOACTIVE SEED LOCALIZATION;  Surgeon: Rolm Bookbinder, MD;  Location: [facility];  Service: General;  Laterality: Left;   INTRAOCULAR PROSTHESES INSERTION  8/248/2005    There were no vitals filed for this visit.   Subjective Assessment - 10/08/21 1019     Subjective Pt. indicated Lt knee was stinging this morning.  Pt. indicated feeling better overall.    Pertinent History History of knee pain c recent injections. xray shows degenerative changes  HTN, history of breast cancer    Limitations Standing;Walking;House hold activities    Patient Stated Goals Reduce pain, be more active.    Currently in Pain? Yes    Pain Score 7    at worst   Pain Location Knee    Pain Orientation Left    Pain Descriptors / Indicators  Other (Comment)   stinging   Pain Type Chronic pain    Pain Onset More than a month ago    Pain Frequency Intermittent    Aggravating Factors  random, unsure    Pain Relieving Factors nothing sure.                [facility] PT Assessment - 10/08/21 0001       Observation/Other Assessments   Focus on Therapeutic Outcomes (FOTO)  predicted 44%                           [facility] Adult PT Treatment/Exercise - 10/08/21 0001       Ambulation/Gait   Gait Comments Able to ambulate independently.      Lumbar Exercises: Stretches   Lower Trunk Rotation 10 seconds;3 reps   bilateral     Lumbar Exercises: Aerobic   Nustep Lvl 6 10 mins UE/LE      Lumbar Exercises: Supine   Bridge 5 seconds;15 reps      Lumbar Exercises: Sidelying   Clam Left;Other (comment)   2 x 15     Manual Therapy   Soft tissue mobilization percussive device to Lt lateral/posterior hip musculature  PT Short Term Goals - 10/08/21 1055       PT SHORT TERM GOAL #1   Title Patient will demonstrate independent use of home exercise program to maintain progress from in clinic treatments.    Time 3    Period Weeks    Status Achieved    Target Date 09/27/21               PT Long Term Goals - 10/08/21 1054       PT LONG TERM GOAL #1   Title Patient will demonstrate/report pain at worst less than or equal to 2/10 to facilitate minimal limitation in daily activity secondary to pain symptoms.    Time 10    Period Weeks    Status On-going    Target Date 11/15/21      PT LONG TERM GOAL #2   Title Patient will demonstrate independent use of home exercise program to facilitate ability to maintain/progress functional gains from skilled physical therapy services.    Time 10    Period Weeks    Status On-going    Target Date 11/15/21      PT LONG TERM GOAL #3   Title Pt. will demonstrate FOTO outcome > or = 44 % to indicated reduced disability due to  condition.    Time 10    Period Weeks    Status Achieved    Target Date 11/15/21      PT LONG TERM GOAL #4   Title Patient will demonstrate lumbar extension 100 % WFL s symptoms to facilitate upright standing, walking posture at PLOF s limitation.    Time 10    Period Weeks    Status On-going    Target Date 11/15/21      PT LONG TERM GOAL #5   Title Pt. will demonstrate bilateral knee MMT 5/5 throughout to facilitate usual daily and household activity at PLOF.    Time 10    Period Weeks    Status On-going    Target Date 11/15/21                   Plan - 10/08/21 1045     Clinical Impression Statement FOTO reassesssment performed with noted improvement compared to evaluation.  Pt. continued to benefit from percussive soft tissue work in lateral and posterior hip.  Overall showing good progress at this time.  Continued skilled PT c HEP warranted.    Personal Factors and Comorbidities Comorbidity 3+    Comorbidities HTN, bilateral chronic knee pain, history of breast cancer    Examination-Activity Limitations Sleep;Squat;Bend;Stairs;Stand;Transfers;Lift;Locomotion Level    Examination-Participation Restrictions Shop;Community Activity;Laundry;Cleaning    Stability/Clinical Decision Making Stable/Uncomplicated    Rehab Potential Good    PT Frequency Other (comment)   1-2x/week   PT Duration Other (comment)   10 weeks   PT Treatment/Interventions ADLs/Self Care Home Management;Electrical Stimulation;Cryotherapy;Iontophoresis 4mg /ml Dexamethasone;Moist Heat;Balance training;Traction;Therapeutic exercise;Therapeutic activities;Functional mobility training;Stair training;Gait training;DME Instruction;Ultrasound;Neuromuscular re-education;Patient/family education;Passive range of motion;Spinal Manipulations;Joint Manipulations;Dry needling;Taping;Manual techniques    PT Next Visit Plan Percussive device as needed.  Continued focus on improved back and hip mobility/strength    PT  Home Exercise Plan [id number]    Consulted and Agree with Plan of Care Patient             Patient will benefit from skilled therapeutic intervention in order to improve the following deficits and impairments:  Abnormal gait, Hypomobility, Decreased activity tolerance, Decreased strength, Pain, Difficulty walking, Decreased mobility, Decreased balance, Decreased  range of motion, Impaired perceived functional ability, Improper body mechanics, Postural dysfunction, Impaired flexibility, Decreased coordination  Visit Diagnosis: Chronic bilateral low back pain with left-sided sciatica  Muscle weakness (generalized)  Difficulty in walking, not elsewhere classified  Abnormal posture  Chronic pain of left knee  Chronic pain of right knee     Problem List Patient Active Problem List   Diagnosis Date Noted   Low back pain 09/02/2021   Trigger finger, right middle finger 08/24/2021   Genetic testing 06/18/2021   Family history of breast cancer 05/26/2021   Family history of bone cancer 05/26/2021   Malignant neoplasm of upper-inner quadrant of left breast in female, estrogen receptor positive ([place]) 05/24/2021   Bilateral primary osteoarthritis of knee 11/12/2019   Obesity 11/12/2019   Chronic right shoulder pain 11/27/2017   Unilateral primary osteoarthritis, left knee 11/27/2017    Scot Jun, PT, DPT, OCS, ATC 10/08/21  10:56 AM  PHYSICAL THERAPY DISCHARGE SUMMARY  Visits from Start of Care: 5  Current functional level related to goals / functional outcomes: See note   Remaining deficits: See note   Education / Equipment: HEP   Patient agrees to discharge. Patient goals were partially met. Patient is being discharged due to not returning since the last visit. Scot Jun, PT, DPT, OCS, ATC 11/25/21  10:41 AM      [facility] [address] [place], [place], [address] Phone: [phone]   Fax:  [phone]  Name:  KAMIYAH KINDEL MRN: [id number] Date of Birth: 1933/03/05

## 2021-10-26 ENCOUNTER — Other Ambulatory Visit: Payer: Self-pay

## 2021-10-26 ENCOUNTER — Ambulatory Visit (HOSPITAL_COMMUNITY): Payer: Medicare Other | Attending: Cardiovascular Disease

## 2021-10-26 DIAGNOSIS — Z01818 Encounter for other preprocedural examination: Secondary | ICD-10-CM | POA: Diagnosis not present

## 2021-10-26 DIAGNOSIS — Z170 Estrogen receptor positive status [ER+]: Secondary | ICD-10-CM | POA: Diagnosis not present

## 2021-10-26 DIAGNOSIS — Z0189 Encounter for other specified special examinations: Secondary | ICD-10-CM

## 2021-10-26 DIAGNOSIS — C50212 Malignant neoplasm of upper-inner quadrant of left female breast: Secondary | ICD-10-CM | POA: Insufficient documentation

## 2021-10-26 DIAGNOSIS — E669 Obesity, unspecified: Secondary | ICD-10-CM | POA: Insufficient documentation

## 2021-10-26 DIAGNOSIS — I119 Hypertensive heart disease without heart failure: Secondary | ICD-10-CM | POA: Insufficient documentation

## 2021-10-26 LAB — ECHOCARDIOGRAM COMPLETE
Area-P 1/2: 3.08 cm2
S' Lateral: 2.9 cm

## 2021-10-27 ENCOUNTER — Inpatient Hospital Stay: Payer: Medicare Other

## 2021-10-27 ENCOUNTER — Other Ambulatory Visit: Payer: Self-pay | Admitting: Adult Health

## 2021-10-27 ENCOUNTER — Encounter: Payer: Self-pay | Admitting: Oncology

## 2021-10-27 ENCOUNTER — Other Ambulatory Visit: Payer: Self-pay | Admitting: Hematology and Oncology

## 2021-10-27 VITALS — BP 162/81 | HR 75 | Temp 98.2°F | Resp 18 | Wt 216.4 lb

## 2021-10-27 DIAGNOSIS — C50212 Malignant neoplasm of upper-inner quadrant of left female breast: Secondary | ICD-10-CM

## 2021-10-27 DIAGNOSIS — Z170 Estrogen receptor positive status [ER+]: Secondary | ICD-10-CM

## 2021-10-27 DIAGNOSIS — Z5112 Encounter for antineoplastic immunotherapy: Secondary | ICD-10-CM | POA: Diagnosis not present

## 2021-10-27 LAB — CBC WITH DIFFERENTIAL/PLATELET
Abs Immature Granulocytes: 0.01 10*3/uL (ref 0.00–0.07)
Basophils Absolute: 0 10*3/uL (ref 0.0–0.1)
Basophils Relative: 1 %
Eosinophils Absolute: 0.2 10*3/uL (ref 0.0–0.5)
Eosinophils Relative: 4 %
HCT: 35.1 % — ABNORMAL LOW (ref 36.0–46.0)
Hemoglobin: 11.3 g/dL — ABNORMAL LOW (ref 12.0–15.0)
Immature Granulocytes: 0 %
Lymphocytes Relative: 29 %
Lymphs Abs: 1.3 10*3/uL (ref 0.7–4.0)
MCH: 28.5 pg (ref 26.0–34.0)
MCHC: 32.2 g/dL (ref 30.0–36.0)
MCV: 88.6 fL (ref 80.0–100.0)
Monocytes Absolute: 0.5 10*3/uL (ref 0.1–1.0)
Monocytes Relative: 11 %
Neutro Abs: 2.5 10*3/uL (ref 1.7–7.7)
Neutrophils Relative %: 55 %
Platelets: 224 10*3/uL (ref 150–400)
RBC: 3.96 MIL/uL (ref 3.87–5.11)
RDW: 14.8 % (ref 11.5–15.5)
WBC: 4.6 10*3/uL (ref 4.0–10.5)
nRBC: 0 % (ref 0.0–0.2)

## 2021-10-27 LAB — COMPREHENSIVE METABOLIC PANEL
ALT: 13 U/L (ref 0–44)
AST: 15 U/L (ref 15–41)
Albumin: 3.8 g/dL (ref 3.5–5.0)
Alkaline Phosphatase: 68 U/L (ref 38–126)
Anion gap: 8 (ref 5–15)
BUN: 24 mg/dL — ABNORMAL HIGH (ref 8–23)
CO2: 26 mmol/L (ref 22–32)
Calcium: 9.2 mg/dL (ref 8.9–10.3)
Chloride: 107 mmol/L (ref 98–111)
Creatinine, Ser: 0.79 mg/dL (ref 0.44–1.00)
GFR, Estimated: >60 mL/min (ref >60)
Glucose, Bld: 135 mg/dL — ABNORMAL HIGH (ref 70–99)
Potassium: 3.4 mmol/L — ABNORMAL LOW (ref 3.5–5.1)
Sodium: 141 mmol/L (ref 135–145)
Total Bilirubin: 0.4 mg/dL (ref 0.3–1.2)
Total Protein: 7.4 g/dL (ref 6.5–8.1)

## 2021-10-27 MED ORDER — TRASTUZUMAB-HYALURONIDASE-OYSK 600-10000 MG-UNT/5ML ~~LOC~~ SOLN
600.0000 mg | Freq: Once | SUBCUTANEOUS | Status: AC
  Administered 2021-10-27: 14:00:00 600 mg via SUBCUTANEOUS
  Filled 2021-10-27: qty 5

## 2021-10-27 MED ORDER — DIPHENHYDRAMINE HCL 25 MG PO CAPS
25.0000 mg | ORAL_CAPSULE | Freq: Once | ORAL | Status: AC
  Administered 2021-10-27: 13:00:00 25 mg via ORAL
  Filled 2021-10-27: qty 1

## 2021-10-27 MED ORDER — ACETAMINOPHEN 325 MG PO TABS
650.0000 mg | ORAL_TABLET | Freq: Once | ORAL | Status: AC
  Administered 2021-10-27: 13:00:00 650 mg via ORAL
  Filled 2021-10-27: qty 2

## 2021-10-27 NOTE — Progress Notes (Signed)
Patient is transitioning from Dr. Jana Hakim to Dr. Lindi Adie.  She needs additional Herceptin orders.  Her echocardiogram was completed yesterday and showed a normal and stable ejection fraction.  Order sent to Dr. Morey Hummingbird to sign in Dr. Geralyn Flash absence.  [name], NP 10/27/21 8:58 AM Medical Oncology and Hematology [facility] [place], [place] [address] Tel. [phone]    Fax. [phone]

## 2021-11-02 ENCOUNTER — Encounter: Payer: Self-pay | Admitting: *Deleted

## 2021-11-11 ENCOUNTER — Encounter: Payer: Self-pay | Admitting: Oncology

## 2021-11-17 ENCOUNTER — Other Ambulatory Visit: Payer: Self-pay

## 2021-11-17 ENCOUNTER — Inpatient Hospital Stay: Payer: Medicare PPO | Attending: Oncology

## 2021-11-17 ENCOUNTER — Inpatient Hospital Stay: Payer: Medicare PPO

## 2021-11-17 VITALS — BP 152/82 | HR 76 | Temp 98.2°F | Resp 17 | Wt 217.5 lb

## 2021-11-17 DIAGNOSIS — Z170 Estrogen receptor positive status [ER+]: Secondary | ICD-10-CM | POA: Diagnosis not present

## 2021-11-17 DIAGNOSIS — C50212 Malignant neoplasm of upper-inner quadrant of left female breast: Secondary | ICD-10-CM | POA: Insufficient documentation

## 2021-11-17 DIAGNOSIS — Z5112 Encounter for antineoplastic immunotherapy: Secondary | ICD-10-CM | POA: Insufficient documentation

## 2021-11-17 LAB — CBC WITH DIFFERENTIAL/PLATELET
Abs Immature Granulocytes: 0.01 10*3/uL (ref 0.00–0.07)
Basophils Absolute: 0.1 10*3/uL (ref 0.0–0.1)
Basophils Relative: 1 %
Eosinophils Absolute: 0.2 10*3/uL (ref 0.0–0.5)
Eosinophils Relative: 5 %
HCT: 34 % — ABNORMAL LOW (ref 36.0–46.0)
Hemoglobin: 10.9 g/dL — ABNORMAL LOW (ref 12.0–15.0)
Immature Granulocytes: 0 %
Lymphocytes Relative: 29 %
Lymphs Abs: 1.2 10*3/uL (ref 0.7–4.0)
MCH: 28.1 pg (ref 26.0–34.0)
MCHC: 32.1 g/dL (ref 30.0–36.0)
MCV: 87.6 fL (ref 80.0–100.0)
Monocytes Absolute: 0.5 10*3/uL (ref 0.1–1.0)
Monocytes Relative: 12 %
Neutro Abs: 2.3 10*3/uL (ref 1.7–7.7)
Neutrophils Relative %: 53 %
Platelets: 215 10*3/uL (ref 150–400)
RBC: 3.88 MIL/uL (ref 3.87–5.11)
RDW: 15.1 % (ref 11.5–15.5)
WBC: 4.3 10*3/uL (ref 4.0–10.5)
nRBC: 0 % (ref 0.0–0.2)

## 2021-11-17 LAB — COMPREHENSIVE METABOLIC PANEL
ALT: 13 U/L (ref 0–44)
AST: 15 U/L (ref 15–41)
Albumin: 3.9 g/dL (ref 3.5–5.0)
Alkaline Phosphatase: 70 U/L (ref 38–126)
Anion gap: 7 (ref 5–15)
BUN: 22 mg/dL (ref 8–23)
CO2: 27 mmol/L (ref 22–32)
Calcium: 9.6 mg/dL (ref 8.9–10.3)
Chloride: 105 mmol/L (ref 98–111)
Creatinine, Ser: 0.83 mg/dL (ref 0.44–1.00)
GFR, Estimated: >60 mL/min (ref >60)
Glucose, Bld: 125 mg/dL — ABNORMAL HIGH (ref 70–99)
Potassium: 3.6 mmol/L (ref 3.5–5.1)
Sodium: 139 mmol/L (ref 135–145)
Total Bilirubin: 0.4 mg/dL (ref 0.3–1.2)
Total Protein: 7.3 g/dL (ref 6.5–8.1)

## 2021-11-17 MED ORDER — TRASTUZUMAB-HYALURONIDASE-OYSK 600-10000 MG-UNT/5ML ~~LOC~~ SOLN
600.0000 mg | Freq: Once | SUBCUTANEOUS | Status: AC
  Administered 2021-11-17: 600 mg via SUBCUTANEOUS
  Filled 2021-11-17: qty 5

## 2021-11-17 MED ORDER — DIPHENHYDRAMINE HCL 25 MG PO CAPS: 25.0000 mg | ORAL_CAPSULE | Freq: Once | ORAL | Status: DC

## 2021-11-17 MED ORDER — ACETAMINOPHEN 325 MG PO TABS
650.0000 mg | ORAL_TABLET | Freq: Once | ORAL | Status: AC
  Administered 2021-11-17: 650 mg via ORAL
  Filled 2021-11-17: qty 2

## 2021-11-17 MED ORDER — DIPHENHYDRAMINE HCL 25 MG PO CAPS
25.0000 mg | ORAL_CAPSULE | Freq: Once | ORAL | Status: AC
  Administered 2021-11-17: 25 mg via ORAL
  Filled 2021-11-17: qty 1

## 2021-11-17 MED ORDER — ACETAMINOPHEN 325 MG PO TABS: 650.0000 mg | ORAL_TABLET | Freq: Once | ORAL | Status: DC

## 2021-12-06 ENCOUNTER — Encounter: Payer: Self-pay | Admitting: Hematology and Oncology

## 2021-12-08 NOTE — Progress Notes (Signed)
Patient Care Team: Seward Carol, MD as PCP - General (Internal Medicine) Mauro Kaufmann, RN as Oncology Nurse Navigator Rockwell Germany, RN as Oncology Nurse Navigator Rolm Bookbinder, MD as Consulting Physician (General Surgery) Magrinat, Virgie Dad, MD (Inactive) as Consulting Physician (Oncology) Eppie Gibson, MD as Attending Physician (Radiation Oncology) Garald Balding, MD as Consulting Physician (Orthopedic Surgery) Shon Hough, MD as Consulting Physician (Ophthalmology) Bensimhon, Shaune Pascal, MD as Consulting Physician (Cardiology)  DIAGNOSIS:    ICD-10-CM   1. Malignant neoplasm of upper-inner quadrant of left breast in female, estrogen receptor positive ([place])  C50.212    Z17.0       SUMMARY OF ONCOLOGIC HISTORY: Oncology History  Malignant neoplasm of upper-inner quadrant of left breast in female, estrogen receptor positive ([place])  05/18/2021 Initial Diagnosis    left breast upper inner quadrant biopsy 05/18/2021 for a clinical T1b N0, stage IA invasive ductal carcinoma, triple positive, with an MIB-1 of 15%   05/26/2021 Cancer Staging   Staging form: Breast, AJCC 8th Edition - Clinical stage from 05/26/2021: Stage IA (cT1b, cN0, cM0, G2, ER+, PR+, HER2+) - Signed by Chauncey Cruel, MD on 05/26/2021 Stage prefix: Initial diagnosis Histologic grading system: 3 grade system Laterality: Left Staged by: Pathologist and managing physician Stage used in treatment planning: Yes National guidelines used in treatment planning: Yes Type of national guideline used in treatment planning: NCCN    06/15/2021 Surgery   left lumpectomy 06/15/2021 for a pT1c pNX invasive ductal carcinoma grade 2, with negative margins   06/18/2021 Genetic Testing   Negative genetic testing:  No pathogenic variants detected on the [employer] CancerNext-Expanded + RNAinsight panel. Three variants of uncertain significance (VUS) were detected - one in the ATM gene called p.F1265L (c.3793T>C), a  second in the LTZR1 gene called c.2414delA (E.N277OEU*2), and a third in the [place] gene called p.P58L (c.173C>T). The report date is 06/18/2021.  The CancerNext-Expanded + RNAinsight gene panel offered by [employer] and includes sequencing and rearrangement analysis for the following 77 genes: AIP, ALK, APC, ATM, AXIN2, BAP1, BARD1, BLM, BMPR1A, BRCA1, BRCA2, BRIP1, CDC73, CDH1, CDK4, CDKN1B, CDKN2A, CHEK2, CTNNA1, DICER1, FANCC, FH, FLCN, GALNT12, KIF1B, LZTR1, MAX, MEN1, MET, MLH1, MSH2, MSH3, MSH6, MUTYH, NBN, NF1, NF2, NTHL1, PALB2, PHOX2B, PMS2, POT1, PRKAR1A, PTCH1, PTEN, RAD51C, RAD51D, RB1, RECQL, RET, SDHA, SDHAF2, SDHB, SDHC, SDHD, SMAD4, SMARCA4, SMARCB1, SMARCE1, STK11, SUFU, TMEM127, TP53, TSC1, TSC2, VHL and XRCC2 (sequencing and deletion/duplication); EGFR, EGLN1, HOXB13, KIT, MITF, PDGFRA, POLD1 and POLE (sequencing only); EPCAM and GREM1 (deletion/duplication only). RNA data is routinely analyzed for use in variant interpretation for all genes.    07/12/2021 -  Anti-estrogen oral therapy   anastrozole started 07/12/2021   07/15/2021 -  Chemotherapy   Patient is on Treatment Plan : BREAST Trastuzumab q21d       CHIEF COMPLIANT: Follow-up of triple positive breast cancer  INTERVAL HISTORY: Jillian Buchanan is a 88 y.o. with above-mentioned history of triple positive breast cancer, currently on antiestrogen therapy with anastrozole. She presents to the clinic today for follow-up.  ALLERGIES:  has No Known Allergies.  MEDICATIONS:  Current Outpatient Medications  Medication Sig Dispense Refill   acetaminophen (TYLENOL) 325 MG tablet      ALPRAZolam (XANAX) 0.5 MG tablet TAKE 1 TABLET BY MOUTH EVERY DAY AS NEEDED FOR ANXIETY  3   amLODipine (NORVASC) 5 MG tablet Take 5 mg by mouth daily.       anastrozole (ARIMIDEX) 1 MG tablet Take 1  tablet (1 mg total) by mouth daily. 90 tablet 4   aspirin 81 MG EC tablet TAKE ONE (1) TABLET EACH DAY  3   bumetanide (BUMEX) 0.5 MG tablet Take  0.5 mg by mouth daily.       hydroquinone 4 % cream AS DIRECTED TO AFFECTED AREA TWICE A DAY AS NEEDED EXTERNALLY     levothyroxine (SYNTHROID, LEVOTHROID) 88 MCG tablet      losartan (COZAAR) 100 MG tablet      naproxen sodium (ALEVE) 220 MG tablet      rosuvastatin (CRESTOR) 5 MG tablet Take 5 mg by mouth daily.     triamcinolone cream (KENALOG) 0.5 % APPLY TO AFFECTED AREA OF LEG TWICE A DAY AS NEEDED  3   No current facility-administered medications for this visit.    PHYSICAL EXAMINATION: ECOG PERFORMANCE STATUS: 1 - Symptomatic but completely ambulatory  Vitals:   12/09/21 1201  BP: (!) 147/88  Pulse: 84  Resp: 18  Temp: (!) 97.5 F (36.4 C)  SpO2: 99%   Filed Weights   12/09/21 1201  Weight: 219 lb 11.2 oz (99.7 kg)    BREAST: No palpable masses or nodules in either right or left breasts. No palpable axillary supraclavicular or infraclavicular adenopathy no breast tenderness or nipple discharge. (exam performed in the presence of a chaperone)  LABORATORY DATA:  I have reviewed the data as listed CMP Latest Ref Rng & Units 12/09/2021 11/17/2021 10/27/2021  Glucose 70 - 99 mg/dL 88 125(H) 135(H)  BUN 8 - 23 mg/dL 25(H) 22 24(H)  Creatinine 0.44 - 1.00 mg/dL 0.84 0.83 0.79  Sodium 135 - 145 mmol/L 139 139 141  Potassium 3.5 - 5.1 mmol/L 3.7 3.6 3.4(L)  Chloride 98 - 111 mmol/L 105 105 107  CO2 22 - 32 mmol/L 27 27 26   Calcium 8.9 - 10.3 mg/dL 9.6 9.6 9.2  Total Protein 6.5 - 8.1 g/dL 7.4 7.3 7.4  Total Bilirubin 0.3 - 1.2 mg/dL 0.5 0.4 0.4  Alkaline Phos 38 - 126 U/L 75 70 68  AST 15 - 41 U/L 15 15 15   ALT 0 - 44 U/L 12 13 13     Lab Results  Component Value Date   WBC 4.2 12/09/2021   HGB 11.0 (L) 12/09/2021   HCT 33.9 (L) 12/09/2021   MCV 86.7 12/09/2021   PLT 204 12/09/2021   NEUTROABS 2.3 12/09/2021    ASSESSMENT & PLAN:  Malignant neoplasm of upper-inner quadrant of left breast in female, estrogen receptor positive ([place]) left lumpectomy 06/15/2021 for  a pT1c pNX invasive ductal carcinoma grade 2 size: 1.4 cm with intermediate grade DCIS, with negative margins, ER 100%, PR 2%, HER2 positive, Ki-67 15%  Current treatment: Herceptin 07/15/2021, anastrozole started 07/12/2021 Toxicities: Denies any adverse effects to Herceptin. Intermittent fatigue and hot flashes are related to anastrozole therapy.  Return to clinic every 3 weeks for Herceptin. We will see her every 9 weeks with labs and MD visits.    No orders of the defined types were placed in this encounter.  The patient has a good understanding of the overall plan. she agrees with it. she will call with any problems that may develop before the next visit here.  Total time spent: 60 mins including face to face time and time spent for planning, charting and coordination of care  Rulon Eisenmenger, MD, MPH 12/09/2021  I, Thana Ates, am acting as scribe for Dr. Nicholas Lose.  I have reviewed the above documentation for  accuracy and completeness, and I agree with the above.

## 2021-12-09 ENCOUNTER — Inpatient Hospital Stay: Payer: Medicare PPO | Admitting: Hematology and Oncology

## 2021-12-09 ENCOUNTER — Other Ambulatory Visit: Payer: Self-pay

## 2021-12-09 ENCOUNTER — Inpatient Hospital Stay: Payer: Medicare PPO | Attending: Oncology

## 2021-12-09 ENCOUNTER — Inpatient Hospital Stay: Payer: Medicare PPO

## 2021-12-09 DIAGNOSIS — C50212 Malignant neoplasm of upper-inner quadrant of left female breast: Secondary | ICD-10-CM | POA: Insufficient documentation

## 2021-12-09 DIAGNOSIS — Z5112 Encounter for antineoplastic immunotherapy: Secondary | ICD-10-CM | POA: Insufficient documentation

## 2021-12-09 DIAGNOSIS — Z170 Estrogen receptor positive status [ER+]: Secondary | ICD-10-CM | POA: Insufficient documentation

## 2021-12-09 LAB — CBC WITH DIFFERENTIAL/PLATELET
Abs Immature Granulocytes: 0 10*3/uL (ref 0.00–0.07)
Basophils Absolute: 0 10*3/uL (ref 0.0–0.1)
Basophils Relative: 1 %
Eosinophils Absolute: 0.2 10*3/uL (ref 0.0–0.5)
Eosinophils Relative: 5 %
HCT: 33.9 % — ABNORMAL LOW (ref 36.0–46.0)
Hemoglobin: 11 g/dL — ABNORMAL LOW (ref 12.0–15.0)
Immature Granulocytes: 0 %
Lymphocytes Relative: 27 %
Lymphs Abs: 1.2 10*3/uL (ref 0.7–4.0)
MCH: 28.1 pg (ref 26.0–34.0)
MCHC: 32.4 g/dL (ref 30.0–36.0)
MCV: 86.7 fL (ref 80.0–100.0)
Monocytes Absolute: 0.5 10*3/uL (ref 0.1–1.0)
Monocytes Relative: 13 %
Neutro Abs: 2.3 10*3/uL (ref 1.7–7.7)
Neutrophils Relative %: 54 %
Platelets: 204 10*3/uL (ref 150–400)
RBC: 3.91 MIL/uL (ref 3.87–5.11)
RDW: 15.2 % (ref 11.5–15.5)
WBC: 4.2 10*3/uL (ref 4.0–10.5)
nRBC: 0 % (ref 0.0–0.2)

## 2021-12-09 LAB — COMPREHENSIVE METABOLIC PANEL
ALT: 12 U/L (ref 0–44)
AST: 15 U/L (ref 15–41)
Albumin: 4 g/dL (ref 3.5–5.0)
Alkaline Phosphatase: 75 U/L (ref 38–126)
Anion gap: 7 (ref 5–15)
BUN: 25 mg/dL — ABNORMAL HIGH (ref 8–23)
CO2: 27 mmol/L (ref 22–32)
Calcium: 9.6 mg/dL (ref 8.9–10.3)
Chloride: 105 mmol/L (ref 98–111)
Creatinine, Ser: 0.84 mg/dL (ref 0.44–1.00)
GFR, Estimated: >60 mL/min (ref >60)
Glucose, Bld: 88 mg/dL (ref 70–99)
Potassium: 3.7 mmol/L (ref 3.5–5.1)
Sodium: 139 mmol/L (ref 135–145)
Total Bilirubin: 0.5 mg/dL (ref 0.3–1.2)
Total Protein: 7.4 g/dL (ref 6.5–8.1)

## 2021-12-09 MED ORDER — DIPHENHYDRAMINE HCL 25 MG PO CAPS
25.0000 mg | ORAL_CAPSULE | Freq: Once | ORAL | Status: AC
  Administered 2021-12-09: 25 mg via ORAL
  Filled 2021-12-09: qty 1

## 2021-12-09 MED ORDER — TRASTUZUMAB-HYALURONIDASE-OYSK 600-10000 MG-UNT/5ML ~~LOC~~ SOLN
600.0000 mg | Freq: Once | SUBCUTANEOUS | Status: AC
  Administered 2021-12-09: 600 mg via SUBCUTANEOUS
  Filled 2021-12-09: qty 5

## 2021-12-09 MED ORDER — ACETAMINOPHEN 325 MG PO TABS
650.0000 mg | ORAL_TABLET | Freq: Once | ORAL | Status: AC
  Administered 2021-12-09: 650 mg via ORAL
  Filled 2021-12-09: qty 2

## 2021-12-09 NOTE — Patient Instructions (Signed)
[facility] MEDICAL ONCOLOGY  Discharge Instructions: Thank you for choosing [facility] to provide your oncology and hematology care.   If you have a lab appointment with the Cancer Center, please go directly to the Cancer Center and check in at the registration area.   Wear comfortable clothing and clothing appropriate for easy access to any Portacath or PICC line.   We strive to give you quality time with your provider. You may need to reschedule your appointment if you arrive late (15 or more minutes).  Arriving late affects you and other patients whose appointments are after yours.  Also, if you miss three or more appointments without notifying the office, you may be dismissed from the clinic at the provider's discretion.      For prescription refill requests, have your pharmacy contact our office and allow 72 hours for refills to be completed.    Today you received the following chemotherapy and/or immunotherapy agents: Herceptin Hylecta.     To help prevent nausea and vomiting after your treatment, we encourage you to take your nausea medication as directed.  BELOW ARE SYMPTOMS THAT SHOULD BE REPORTED IMMEDIATELY: . *FEVER GREATER THAN 100.4 F (38 C) OR HIGHER . *CHILLS OR SWEATING . *NAUSEA AND VOMITING THAT IS NOT CONTROLLED WITH YOUR NAUSEA MEDICATION . *UNUSUAL SHORTNESS OF BREATH . *UNUSUAL BRUISING OR BLEEDING . *URINARY PROBLEMS (pain or burning when urinating, or frequent urination) . *BOWEL PROBLEMS (unusual diarrhea, constipation, pain near the anus) . TENDERNESS IN MOUTH AND THROAT WITH OR WITHOUT PRESENCE OF ULCERS (sore throat, sores in mouth, or a toothache) . UNUSUAL RASH, SWELLING OR PAIN  . UNUSUAL VAGINAL DISCHARGE OR ITCHING   Items with * indicate a potential emergency and should be followed up as soon as possible or go to the Emergency Department if any problems should occur.  Please show the CHEMOTHERAPY ALERT CARD or  IMMUNOTHERAPY ALERT CARD at check-in to the Emergency Department and triage nurse.  Should you have questions after your visit or need to cancel or reschedule your appointment, please contact [facility]ICAL [facility]COL[facility]  Dept: [phone]  and follow the prompts.  Office hours are 8:00 a.m. to 4:30 p.m. Monday - Friday. Please note that voicemails left after 4:00 p.m. may not be returned until the following business day.  We are closed weekends and major holidays. You have access to a nurse at all times for urgent questions. Please call the main number to the clinic Dept: [phone] and follow the prompts.   For any non-urgent questions, you may also contact your provider using MyChart. We now offer e-Visits for anyone 18 and older to request care online for non-urgent symptoms. For details visit [email or web address].   Also download the MyChart app! Go to the app store, search "MyChart", open the app, select [facility], and log in with your MyChart username and password.  Due to Covid, a mask is required upon entering the hospital/clinic. If you do not have a mask, one will be given to you upon arrival. For doctor visits, patients may have 1 support person aged 18 or older with them. For treatment visits, patients cannot have anyone with them due to current Covid guidelines and our immunocompromised population.   

## 2021-12-09 NOTE — Assessment & Plan Note (Signed)
left lumpectomy 06/15/2021 for a pT1c pNX invasive ductal carcinoma grade 2 size: 1.4 cm with intermediate grade DCIS, with negative margins, ER 100%, PR 2%, HER2 positive, Ki-67 15%  Current treatment: Herceptin 07/15/2021, anastrozole started 07/12/2021 Toxicities:  Return to clinic every 3 weeks for Herceptin. We will see her every 6 weeks.

## 2021-12-10 ENCOUNTER — Telehealth: Payer: Self-pay | Admitting: Hematology and Oncology

## 2021-12-10 NOTE — Telephone Encounter (Signed)
Scheduled follow-up appointments per 2/9 los. Patient is aware. 

## 2021-12-29 ENCOUNTER — Other Ambulatory Visit: Payer: Medicare Other

## 2021-12-29 ENCOUNTER — Inpatient Hospital Stay: Payer: Medicare PPO | Attending: Oncology

## 2021-12-29 ENCOUNTER — Other Ambulatory Visit: Payer: Self-pay

## 2021-12-29 VITALS — BP 141/71 | HR 74 | Temp 98.2°F | Resp 18

## 2021-12-29 DIAGNOSIS — Z5112 Encounter for antineoplastic immunotherapy: Secondary | ICD-10-CM | POA: Insufficient documentation

## 2021-12-29 DIAGNOSIS — C50212 Malignant neoplasm of upper-inner quadrant of left female breast: Secondary | ICD-10-CM

## 2021-12-29 DIAGNOSIS — Z170 Estrogen receptor positive status [ER+]: Secondary | ICD-10-CM | POA: Insufficient documentation

## 2021-12-29 MED ORDER — TRASTUZUMAB-HYALURONIDASE-OYSK 600-10000 MG-UNT/5ML ~~LOC~~ SOLN
600.0000 mg | Freq: Once | SUBCUTANEOUS | Status: AC
  Administered 2021-12-29: 600 mg via SUBCUTANEOUS
  Filled 2021-12-29: qty 5

## 2021-12-29 MED ORDER — ACETAMINOPHEN 325 MG PO TABS
650.0000 mg | ORAL_TABLET | Freq: Once | ORAL | Status: AC
  Administered 2021-12-29: 650 mg via ORAL
  Filled 2021-12-29: qty 2

## 2021-12-29 MED ORDER — DIPHENHYDRAMINE HCL 25 MG PO CAPS
25.0000 mg | ORAL_CAPSULE | Freq: Once | ORAL | Status: AC
  Administered 2021-12-29: 25 mg via ORAL
  Filled 2021-12-29: qty 1

## 2021-12-29 NOTE — Patient Instructions (Signed)
[facility] MEDICAL ONCOLOGY  Discharge Instructions: Thank you for choosing [facility] to provide your oncology and hematology care.   If you have a lab appointment with the Cancer Center, please go directly to the Cancer Center and check in at the registration area.   Wear comfortable clothing and clothing appropriate for easy access to any Portacath or PICC line.   We strive to give you quality time with your provider. You may need to reschedule your appointment if you arrive late (15 or more minutes).  Arriving late affects you and other patients whose appointments are after yours.  Also, if you miss three or more appointments without notifying the office, you may be dismissed from the clinic at the provider's discretion.      For prescription refill requests, have your pharmacy contact our office and allow 72 hours for refills to be completed.    Today you received the following chemotherapy and/or immunotherapy agents: Herceptin Hylecta.     To help prevent nausea and vomiting after your treatment, we encourage you to take your nausea medication as directed.  BELOW ARE SYMPTOMS THAT SHOULD BE REPORTED IMMEDIATELY: . *FEVER GREATER THAN 100.4 F (38 C) OR HIGHER . *CHILLS OR SWEATING . *NAUSEA AND VOMITING THAT IS NOT CONTROLLED WITH YOUR NAUSEA MEDICATION . *UNUSUAL SHORTNESS OF BREATH . *UNUSUAL BRUISING OR BLEEDING . *URINARY PROBLEMS (pain or burning when urinating, or frequent urination) . *BOWEL PROBLEMS (unusual diarrhea, constipation, pain near the anus) . TENDERNESS IN MOUTH AND THROAT WITH OR WITHOUT PRESENCE OF ULCERS (sore throat, sores in mouth, or a toothache) . UNUSUAL RASH, SWELLING OR PAIN  . UNUSUAL VAGINAL DISCHARGE OR ITCHING   Items with * indicate a potential emergency and should be followed up as soon as possible or go to the Emergency Department if any problems should occur.  Please show the CHEMOTHERAPY ALERT CARD or  IMMUNOTHERAPY ALERT CARD at check-in to the Emergency Department and triage nurse.  Should you have questions after your visit or need to cancel or reschedule your appointment, please contact [facility]  Dept: [phone]  and follow the prompts.  Office hours are 8:00 a.m. to 4:30 p.m. Monday - Friday. Please note that voicemails left after 4:00 p.m. may not be returned until the following business day.  We are closed weekends and major holidays. You have access to a nurse at all times for urgent questions. Please call the main number to the clinic Dept: [phone] and follow the prompts.   For any non-urgent questions, you may also contact your provider using MyChart. We now offer e-Visits for anyone 18 and older to request care online for non-urgent symptoms. For details visit [email or web address].   Also download the MyChart app! Go to the app store, search "MyChart", open the app, select , and log in with your MyChart username and password.  Due to Covid, a mask is required upon entering the hospital/clinic. If you do not have a mask, one will be given to you upon arrival. For doctor visits, patients may have 1 support person aged 18 or older with them. For treatment visits, patients cannot have anyone with them due to current Covid guidelines and our immunocompromised population.   

## 2022-01-03 ENCOUNTER — Telehealth: Payer: Self-pay | Admitting: Hematology and Oncology

## 2022-01-03 ENCOUNTER — Other Ambulatory Visit: Payer: Self-pay | Admitting: *Deleted

## 2022-01-03 MED ORDER — ANASTROZOLE 1 MG PO TABS: 1.0000 mg | ORAL_TABLET | Freq: Every day | ORAL | 3 refills | Status: DC

## 2022-01-03 NOTE — Telephone Encounter (Signed)
Patient called to see if she could get a refill on some medication and wasn't sure how to do it since she has a new provider, I sent message over to Nurse Merleen Nicely and she is getting it refilled for the patient ?

## 2022-01-14 ENCOUNTER — Encounter: Payer: Self-pay | Admitting: *Deleted

## 2022-01-19 ENCOUNTER — Other Ambulatory Visit: Payer: Self-pay

## 2022-01-19 ENCOUNTER — Inpatient Hospital Stay: Payer: Medicare PPO

## 2022-01-19 ENCOUNTER — Other Ambulatory Visit: Payer: Medicare Other

## 2022-01-19 VITALS — BP 156/81 | HR 72 | Temp 97.9°F | Resp 17 | Ht 64.0 in | Wt 219.8 lb

## 2022-01-19 DIAGNOSIS — Z5112 Encounter for antineoplastic immunotherapy: Secondary | ICD-10-CM | POA: Diagnosis not present

## 2022-01-19 DIAGNOSIS — C50212 Malignant neoplasm of upper-inner quadrant of left female breast: Secondary | ICD-10-CM

## 2022-01-19 MED ORDER — ACETAMINOPHEN 325 MG PO TABS
650.0000 mg | ORAL_TABLET | Freq: Once | ORAL | Status: AC
  Administered 2022-01-19: 650 mg via ORAL
  Filled 2022-01-19: qty 2

## 2022-01-19 MED ORDER — TRASTUZUMAB-HYALURONIDASE-OYSK 600-10000 MG-UNT/5ML ~~LOC~~ SOLN
600.0000 mg | Freq: Once | SUBCUTANEOUS | Status: AC
  Administered 2022-01-19: 600 mg via SUBCUTANEOUS
  Filled 2022-01-19: qty 5

## 2022-01-19 MED ORDER — DIPHENHYDRAMINE HCL 25 MG PO CAPS
25.0000 mg | ORAL_CAPSULE | Freq: Once | ORAL | Status: AC
  Administered 2022-01-19: 25 mg via ORAL
  Filled 2022-01-19: qty 1

## 2022-02-09 ENCOUNTER — Inpatient Hospital Stay: Payer: Medicare PPO

## 2022-02-09 ENCOUNTER — Inpatient Hospital Stay: Payer: Medicare PPO | Admitting: Hematology and Oncology

## 2022-02-09 ENCOUNTER — Inpatient Hospital Stay: Payer: Medicare PPO | Attending: Oncology

## 2022-02-09 ENCOUNTER — Other Ambulatory Visit: Payer: Self-pay | Admitting: *Deleted

## 2022-02-09 ENCOUNTER — Other Ambulatory Visit: Payer: Self-pay

## 2022-02-09 ENCOUNTER — Encounter: Payer: Self-pay | Admitting: Hematology and Oncology

## 2022-02-09 VITALS — BP 145/74 | HR 74 | Temp 97.5°F | Resp 16 | Ht 64.0 in | Wt 215.2 lb

## 2022-02-09 DIAGNOSIS — C50212 Malignant neoplasm of upper-inner quadrant of left female breast: Secondary | ICD-10-CM

## 2022-02-09 DIAGNOSIS — Z170 Estrogen receptor positive status [ER+]: Secondary | ICD-10-CM | POA: Insufficient documentation

## 2022-02-09 DIAGNOSIS — Z5112 Encounter for antineoplastic immunotherapy: Secondary | ICD-10-CM | POA: Insufficient documentation

## 2022-02-09 LAB — CBC WITH DIFFERENTIAL (CANCER CENTER ONLY)
Abs Immature Granulocytes: 0 10*3/uL (ref 0.00–0.07)
Basophils Absolute: 0 10*3/uL (ref 0.0–0.1)
Basophils Relative: 1 %
Eosinophils Absolute: 0.2 10*3/uL (ref 0.0–0.5)
Eosinophils Relative: 4 %
HCT: 35.1 % — ABNORMAL LOW (ref 36.0–46.0)
Hemoglobin: 11.3 g/dL — ABNORMAL LOW (ref 12.0–15.0)
Immature Granulocytes: 0 %
Lymphocytes Relative: 30 %
Lymphs Abs: 1.4 10*3/uL (ref 0.7–4.0)
MCH: 27.8 pg (ref 26.0–34.0)
MCHC: 32.2 g/dL (ref 30.0–36.0)
MCV: 86.5 fL (ref 80.0–100.0)
Monocytes Absolute: 0.5 10*3/uL (ref 0.1–1.0)
Monocytes Relative: 10 %
Neutro Abs: 2.6 10*3/uL (ref 1.7–7.7)
Neutrophils Relative %: 55 %
Platelet Count: 207 10*3/uL (ref 150–400)
RBC: 4.06 MIL/uL (ref 3.87–5.11)
RDW: 15.1 % (ref 11.5–15.5)
WBC Count: 4.7 10*3/uL (ref 4.0–10.5)
nRBC: 0 % (ref 0.0–0.2)

## 2022-02-09 LAB — CBC (CANCER CENTER ONLY)
HCT: 35 % — ABNORMAL LOW (ref 36.0–46.0)
Hemoglobin: 11.3 g/dL — ABNORMAL LOW (ref 12.0–15.0)
MCH: 27.9 pg (ref 26.0–34.0)
MCHC: 32.3 g/dL (ref 30.0–36.0)
MCV: 86.4 fL (ref 80.0–100.0)
Platelet Count: 211 10*3/uL (ref 150–400)
RBC: 4.05 MIL/uL (ref 3.87–5.11)
RDW: 15 % (ref 11.5–15.5)
WBC Count: 4.6 10*3/uL (ref 4.0–10.5)
nRBC: 0 % (ref 0.0–0.2)

## 2022-02-09 LAB — DIFFERENTIAL
Abs Immature Granulocytes: 0 10*3/uL (ref 0.00–0.07)
Basophils Absolute: 0 10*3/uL (ref 0.0–0.1)
Basophils Relative: 1 %
Eosinophils Absolute: 0.2 10*3/uL (ref 0.0–0.5)
Eosinophils Relative: 4 %
Immature Granulocytes: 0 %
Lymphocytes Relative: 31 %
Lymphs Abs: 1.4 10*3/uL (ref 0.7–4.0)
Monocytes Absolute: 0.5 10*3/uL (ref 0.1–1.0)
Monocytes Relative: 11 %
Neutro Abs: 2.5 10*3/uL (ref 1.7–7.7)
Neutrophils Relative %: 53 %

## 2022-02-09 LAB — CMP (CANCER CENTER ONLY)
ALT: 15 U/L (ref 0–44)
AST: 18 U/L (ref 15–41)
Albumin: 3.9 g/dL (ref 3.5–5.0)
Alkaline Phosphatase: 65 U/L (ref 38–126)
Anion gap: 8 (ref 5–15)
BUN: 23 mg/dL (ref 8–23)
CO2: 25 mmol/L (ref 22–32)
Calcium: 9.6 mg/dL (ref 8.9–10.3)
Chloride: 105 mmol/L (ref 98–111)
Creatinine: 0.91 mg/dL (ref 0.44–1.00)
GFR, Estimated: >60 mL/min (ref >60)
Glucose, Bld: 106 mg/dL — ABNORMAL HIGH (ref 70–99)
Potassium: 3.5 mmol/L (ref 3.5–5.1)
Sodium: 138 mmol/L (ref 135–145)
Total Bilirubin: 0.6 mg/dL (ref 0.3–1.2)
Total Protein: 7.6 g/dL (ref 6.5–8.1)

## 2022-02-09 MED ORDER — TRASTUZUMAB-HYALURONIDASE-OYSK 600-10000 MG-UNT/5ML ~~LOC~~ SOLN
600.0000 mg | Freq: Once | SUBCUTANEOUS | Status: AC
  Administered 2022-02-09: 600 mg via SUBCUTANEOUS
  Filled 2022-02-09: qty 5

## 2022-02-09 MED ORDER — ACETAMINOPHEN 325 MG PO TABS
650.0000 mg | ORAL_TABLET | Freq: Once | ORAL | Status: AC
  Administered 2022-02-09: 650 mg via ORAL
  Filled 2022-02-09: qty 2

## 2022-02-09 NOTE — Progress Notes (Signed)
Order placed for ECHO - called [facility] to schedule - left message as instructed on VM ?

## 2022-02-09 NOTE — Patient Instructions (Signed)
[facility]  Discharge Instructions: Thank you for choosing [facility] to provide your oncology and hematology care.   If you have a lab appointment with the Cancer Center, please go directly to the Cancer Center and check in at the registration area.   Wear comfortable clothing and clothing appropriate for easy access to any Portacath or PICC line.   We strive to give you quality time with your provider. You may need to reschedule your appointment if you arrive late (15 or more minutes).  Arriving late affects you and other patients whose appointments are after yours.  Also, if you miss three or more appointments without notifying the office, you may be dismissed from the clinic at the provider's discretion.      For prescription refill requests, have your pharmacy contact our office and allow 72 hours for refills to be completed.    Today you received the following chemotherapy and/or immunotherapy agents: Herceptin Hylecta.     To help prevent nausea and vomiting after your treatment, we encourage you to take your nausea medication as directed.  BELOW ARE SYMPTOMS THAT SHOULD BE REPORTED IMMEDIATELY: . *FEVER GREATER THAN 100.4 F (38 C) OR HIGHER . *CHILLS OR SWEATING . *NAUSEA AND VOMITING THAT IS NOT CONTROLLED WITH YOUR NAUSEA MEDICATION . *UNUSUAL SHORTNESS OF BREATH . *UNUSUAL BRUISING OR BLEEDING . *URINARY PROBLEMS (pain or burning when urinating, or frequent urination) . *BOWEL PROBLEMS (unusual diarrhea, constipation, pain near the anus) . TENDERNESS IN MOUTH AND THROAT WITH OR WITHOUT PRESENCE OF ULCERS (sore throat, sores in mouth, or a toothache) . UNUSUAL RASH, SWELLING OR PAIN  . UNUSUAL VAGINAL DISCHARGE OR ITCHING   Items with * indicate a potential emergency and should be followed up as soon as possible or go to the Emergency Department if any problems should occur.  Please show the CHEMOTHERAPY ALERT CARD or  IMMUNOTHERAPY ALERT CARD at check-in to the Emergency Department and triage nurse.  Should you have questions after your visit or need to cancel or reschedule your appointment, please contact [facility]  Dept: [phone]  and follow the prompts.  Office hours are 8:00 a.m. to 4:30 p.m. Monday - Friday. Please note that voicemails left after 4:00 p.m. may not be returned until the following business day.  We are closed weekends and major holidays. You have access to a nurse at all times for urgent questions. Please call the main number to the clinic Dept: [phone] and follow the prompts.   For any non-urgent questions, you may also contact your provider using MyChart. We now offer e-Visits for anyone 18 and older to request care online for non-urgent symptoms. For details visit [email or web address].   Also download the MyChart app! Go to the app store, search "MyChart", open the app, select [facility], and log in with your MyChart username and password.  Due to Covid, a mask is required upon entering the hospital/clinic. If you do not have a mask, one will be given to you upon arrival. For doctor visits, patients may have 1 support person aged 18 or older with them. For treatment visits, patients cannot have anyone with them due to current Covid guidelines and our immunocompromised population.   

## 2022-02-09 NOTE — Progress Notes (Signed)
Ok to proceed with echo from 12/27 per Dr Chryl Heck ?

## 2022-02-09 NOTE — Progress Notes (Signed)
? ?Patient Care Team: ?Seward Carol, MD as PCP - General (Internal Medicine) ?Mauro Kaufmann, RN as Oncology Nurse Navigator ?Rockwell Germany, RN as Oncology Nurse Navigator ?Rolm Bookbinder, MD as Consulting Physician (General Surgery) ?Magrinat, Virgie Dad, MD (Inactive) as Consulting Physician (Oncology) ?Eppie Gibson, MD as Attending Physician (Radiation Oncology) ?Garald Balding, MD as Consulting Physician (Orthopedic Surgery) ?Shon Hough, MD as Consulting Physician (Ophthalmology) ?Bensimhon, Shaune Pascal, MD as Consulting Physician (Cardiology) ? ?DIAGNOSIS:  ?No diagnosis found. ? ? ?SUMMARY OF ONCOLOGIC HISTORY: ?Oncology History  ?Malignant neoplasm of upper-inner quadrant of left breast in female, estrogen receptor positive ([place])  ?05/18/2021 Initial Diagnosis  ?  left breast upper inner quadrant biopsy 05/18/2021 for a clinical T1b N0, stage IA invasive ductal carcinoma, triple positive, with an MIB-1 of 15% ?  ?05/26/2021 Cancer Staging  ? Staging form: Breast, AJCC 8th Edition ?- Clinical stage from 05/26/2021: Stage IA (cT1b, cN0, cM0, G2, ER+, PR+, HER2+) - Signed by Chauncey Cruel, MD on 05/26/2021 ?Stage prefix: Initial diagnosis ?Histologic grading system: 3 grade system ?Laterality: Left ?Staged by: Pathologist and managing physician ?Stage used in treatment planning: Yes ?National guidelines used in treatment planning: Yes ?Type of national guideline used in treatment planning: NCCN ? ?  ?06/15/2021 Surgery  ? left lumpectomy 06/15/2021 for a pT1c pNX invasive ductal carcinoma grade 2, with negative margins ?  ?06/18/2021 Genetic Testing  ? Negative genetic testing:  No pathogenic variants detected on the [employer] CancerNext-Expanded + RNAinsight panel. Three variants of uncertain significance (VUS) were detected - one in the ATM gene called p.F1265L (c.3793T>C), a second in the LTZR1 gene called c.2414delA ?(p.K805Sfs*8), and a third in the Cecilia gene called p.P58L (c.173C>T). The  report date is 06/18/2021. ? ?The CancerNext-Expanded + RNAinsight gene panel offered by [employer] and includes sequencing and rearrangement analysis for the following 77 genes: AIP, ALK, APC, ATM, AXIN2, BAP1, BARD1, BLM, BMPR1A, BRCA1, BRCA2, BRIP1, CDC73, CDH1, CDK4, CDKN1B, CDKN2A, CHEK2, CTNNA1, DICER1, FANCC, FH, FLCN, GALNT12, KIF1B, LZTR1, MAX, MEN1, MET, MLH1, MSH2, MSH3, MSH6, MUTYH, NBN, NF1, NF2, NTHL1, PALB2, PHOX2B, PMS2, POT1, PRKAR1A, PTCH1, PTEN, RAD51C, RAD51D, RB1, RECQL, RET, SDHA, SDHAF2, SDHB, SDHC, SDHD, SMAD4, SMARCA4, SMARCB1, SMARCE1, STK11, SUFU, TMEM127, TP53, TSC1, TSC2, VHL and XRCC2 (sequencing and deletion/duplication); EGFR, EGLN1, HOXB13, KIT, MITF, PDGFRA, POLD1 and POLE (sequencing only); EPCAM and GREM1 (deletion/duplication only). RNA data is routinely analyzed for use in variant interpretation for all genes.  ?  ?07/12/2021 -  Anti-estrogen oral therapy  ? anastrozole started 07/12/2021 ?  ?07/15/2021 -  Chemotherapy  ? Patient is on Treatment Plan : BREAST Trastuzumab q21d  ?   ? ? ?CHIEF COMPLIANT: Follow-up of triple positive breast cancer ? ?INTERVAL HISTORY: Jillian Buchanan is a 88 y.o. with above-mentioned history of triple positive breast cancer, currently on antiestrogen therapy with anastrozole. She presents to the clinic today for follow-up. ?She denies any complaints except for fatigue, hot flashes. She otherwise tolerates the medication well ?No chest pain, chest pressure, shortness of breath. She has baseline leg swelling, not worse. ?Rest of the pertinent 10 point ROS reviewed and negative. ? ?ALLERGIES:  has No Known Allergies. ? ?MEDICATIONS:  ?Current Outpatient Medications  ?Medication Sig Dispense Refill  ? acetaminophen (TYLENOL) 325 MG tablet     ? ALPRAZolam (XANAX) 0.5 MG tablet TAKE 1 TABLET BY MOUTH EVERY DAY AS NEEDED FOR ANXIETY  3  ? amLODipine (NORVASC) 5 MG tablet Take 5 mg by mouth daily.      ?  anastrozole (ARIMIDEX) 1 MG tablet Take 1 tablet (1  mg total) by mouth daily. 90 tablet 3  ? aspirin 81 MG EC tablet TAKE ONE (1) TABLET EACH DAY  3  ? bumetanide (BUMEX) 0.5 MG tablet Take 0.5 mg by mouth daily.      ? hydroquinone 4 % cream AS DIRECTED TO AFFECTED AREA TWICE A DAY AS NEEDED EXTERNALLY    ? levothyroxine (SYNTHROID, LEVOTHROID) 88 MCG tablet     ? losartan (COZAAR) 100 MG tablet     ? naproxen sodium (ALEVE) 220 MG tablet     ? rosuvastatin (CRESTOR) 5 MG tablet Take 5 mg by mouth daily.    ? triamcinolone cream (KENALOG) 0.5 % APPLY TO AFFECTED AREA OF LEG TWICE A DAY AS NEEDED  3  ? ?No current facility-administered medications for this visit.  ? ? ?PHYSICAL EXAMINATION: ?ECOG PERFORMANCE STATUS: 1 - Symptomatic but completely ambulatory ? ?Vitals:  ? 02/09/22 1340  ?BP: (!) 145/74  ?Pulse: 74  ?Resp: 16  ?Temp: (!) 97.5 ?F (36.4 ?C)  ?SpO2: 99%  ? ?Filed Weights  ? 02/09/22 1340  ?Weight: 215 lb 3.2 oz (97.6 kg)  ? ? ?Physical Exam ?Constitutional:   ?   Appearance: Normal appearance.  ?Chest:  ?   Comments: Large seroma in the left breast measuring about 5 cms, no other palpable masses ?No regional adenopathy ?Musculoskeletal:  ?   Cervical back: Normal range of motion and neck supple. No rigidity.  ?Lymphadenopathy:  ?   Cervical: No cervical adenopathy.  ?Skin: ?   General: Skin is warm and dry.  ?Neurological:  ?   General: No focal deficit present.  ?   Mental Status: She is alert.  ? ? ? ?LABORATORY DATA:  ?I have reviewed the data as listed ? ?  Latest Ref Rng & Units 12/09/2021  ? 11:23 AM 11/17/2021  ?  1:05 PM 10/27/2021  ? 12:28 PM  ?CMP  ?Glucose 70 - 99 mg/dL 88   125   135    ?BUN 8 - 23 mg/dL 25   22   24     ?Creatinine 0.44 - 1.00 mg/dL 0.84   0.83   0.79    ?Sodium 135 - 145 mmol/L 139   139   141    ?Potassium 3.5 - 5.1 mmol/L 3.7   3.6   3.4    ?Chloride 98 - 111 mmol/L 105   105   107    ?CO2 22 - 32 mmol/L 27   27   26     ?Calcium 8.9 - 10.3 mg/dL 9.6   9.6   9.2    ?Total Protein 6.5 - 8.1 g/dL 7.4   7.3   7.4    ?Total  Bilirubin 0.3 - 1.2 mg/dL 0.5   0.4   0.4    ?Alkaline Phos 38 - 126 U/L 75   70   68    ?AST 15 - 41 U/L 15   15   15     ?ALT 0 - 44 U/L 12   13   13     ? ? ?Lab Results  ?Component Value Date  ? WBC 4.7 02/09/2022  ? HGB 11.3 (L) 02/09/2022  ? HCT 35.1 (L) 02/09/2022  ? MCV 86.5 02/09/2022  ? PLT 207 02/09/2022  ? NEUTROABS 2.6 02/09/2022  ? ? ?ASSESSMENT & PLAN:  ? ?Malignant neoplasm of upper-inner quadrant of left breast in female, estrogen receptor positive ([place]) ?left lumpectomy  06/15/2021 for a pT1c pNX invasive ductal carcinoma grade 2 size: 1.4 cm with intermediate grade DCIS, with negative margins, ER 100%, PR 2%, HER2 positive, Ki-67 15% ? ?Current treatment: Herceptin 07/15/2021, anastrozole started 07/12/2021 ?Toxicities: Denies any adverse effects to Herceptin. ?Intermittent fatigue and hot flashes are related to anastrozole therapy. ? ?Return to clinic every 3 weeks for Herceptin. ?We will see her every 9 weeks with labs and MD visits. ? ?She is doing very well on the current regimen ?Ok to proceed with treatment as planned, ?She will need an ECHO soon, no concern for cardiac compromise. ?I also discontinued benadryl as premed for herceptin, she feels too drowsy ? ?Orders Placed This Encounter  ?Procedures  ? CMP ([place] only)  ? CBC ([facility] Only)  ? Differential  ? ?The patient has a good understanding of the overall plan. she agrees with it. she will call with any problems that may develop before the next visit here. ? ?Total time spent: 30 mins including face to face time and time spent for planning, charting and coordination of care ? ? ? ?Benay Pike MD ? ?

## 2022-02-15 ENCOUNTER — Other Ambulatory Visit (HOSPITAL_COMMUNITY): Payer: Medicare PPO

## 2022-02-16 ENCOUNTER — Encounter (HOSPITAL_COMMUNITY): Payer: Self-pay

## 2022-03-02 ENCOUNTER — Other Ambulatory Visit: Payer: Self-pay

## 2022-03-02 ENCOUNTER — Inpatient Hospital Stay: Payer: Medicare PPO | Attending: Oncology

## 2022-03-02 ENCOUNTER — Encounter: Payer: Self-pay | Admitting: *Deleted

## 2022-03-02 VITALS — BP 140/63 | HR 86 | Temp 98.1°F | Resp 17 | Wt 214.5 lb

## 2022-03-02 DIAGNOSIS — C50212 Malignant neoplasm of upper-inner quadrant of left female breast: Secondary | ICD-10-CM | POA: Diagnosis present

## 2022-03-02 DIAGNOSIS — Z170 Estrogen receptor positive status [ER+]: Secondary | ICD-10-CM | POA: Insufficient documentation

## 2022-03-02 DIAGNOSIS — Z5112 Encounter for antineoplastic immunotherapy: Secondary | ICD-10-CM | POA: Insufficient documentation

## 2022-03-02 MED ORDER — ACETAMINOPHEN 325 MG PO TABS
650.0000 mg | ORAL_TABLET | Freq: Once | ORAL | Status: AC
  Administered 2022-03-02: 650 mg via ORAL
  Filled 2022-03-02: qty 2

## 2022-03-02 MED ORDER — TRASTUZUMAB-HYALURONIDASE-OYSK 600-10000 MG-UNT/5ML ~~LOC~~ SOLN
600.0000 mg | Freq: Once | SUBCUTANEOUS | Status: AC
  Administered 2022-03-02: 600 mg via SUBCUTANEOUS
  Filled 2022-03-02: qty 5

## 2022-03-02 NOTE — Progress Notes (Signed)
Ok to treat with ECHO from Dec 2022 per Dr Chryl Heck ?

## 2022-03-02 NOTE — Patient Instructions (Signed)
[facility]  Discharge Instructions: Thank you for choosing [facility] to provide your oncology and hematology care.   If you have a lab appointment with the Cancer Center, please go directly to the Cancer Center and check in at the registration area.   Wear comfortable clothing and clothing appropriate for easy access to any Portacath or PICC line.   We strive to give you quality time with your provider. You may need to reschedule your appointment if you arrive late (15 or more minutes).  Arriving late affects you and other patients whose appointments are after yours.  Also, if you miss three or more appointments without notifying the office, you may be dismissed from the clinic at the provider's discretion.      For prescription refill requests, have your pharmacy contact our office and allow 72 hours for refills to be completed.    Today you received the following chemotherapy and/or immunotherapy agents: Herceptin Hylecta.     To help prevent nausea and vomiting after your treatment, we encourage you to take your nausea medication as directed.  BELOW ARE SYMPTOMS THAT SHOULD BE REPORTED IMMEDIATELY: . *FEVER GREATER THAN 100.4 F (38 C) OR HIGHER . *CHILLS OR SWEATING . *NAUSEA AND VOMITING THAT IS NOT CONTROLLED WITH YOUR NAUSEA MEDICATION . *UNUSUAL SHORTNESS OF BREATH . *UNUSUAL BRUISING OR BLEEDING . *URINARY PROBLEMS (pain or burning when urinating, or frequent urination) . *BOWEL PROBLEMS (unusual diarrhea, constipation, pain near the anus) . TENDERNESS IN MOUTH AND THROAT WITH OR WITHOUT PRESENCE OF ULCERS (sore throat, sores in mouth, or a toothache) . UNUSUAL RASH, SWELLING OR PAIN  . UNUSUAL VAGINAL DISCHARGE OR ITCHING   Items with * indicate a potential emergency and should be followed up as soon as possible or go to the Emergency Department if any problems should occur.  Please show the CHEMOTHERAPY ALERT CARD or  IMMUNOTHERAPY ALERT CARD at check-in to the Emergency Department and triage nurse.  Should you have questions after your visit or need to cancel or reschedule your appointment, please contact [facility]  Dept: [phone]  and follow the prompts.  Office hours are 8:00 a.m. to 4:30 p.m. Monday - Friday. Please note that voicemails left after 4:00 p.m. may not be returned until the following business day.  We are closed weekends and major holidays. You have access to a nurse at all times for urgent questions. Please call the main number to the clinic Dept: [phone] and follow the prompts.   For any non-urgent questions, you may also contact your provider using MyChart. We now offer e-Visits for anyone 18 and older to request care online for non-urgent symptoms. For details visit [email or web address].   Also download the MyChart app! Go to the app store, search "MyChart", open the app, select [facility], and log in with your MyChart username and password.  Due to Covid, a mask is required upon entering the hospital/clinic. If you do not have a mask, one will be given to you upon arrival. For doctor visits, patients may have 1 support person aged 18 or older with them. For treatment visits, patients cannot have anyone with them due to current Covid guidelines and our immunocompromised population.   

## 2022-03-07 ENCOUNTER — Ambulatory Visit (HOSPITAL_COMMUNITY)
Admission: RE | Admit: 2022-03-07 | Discharge: 2022-03-07 | Disposition: A | Discharge status: Home / Self Care | Payer: Medicare PPO | Source: Ambulatory Visit | Attending: Hematology and Oncology | Admitting: Hematology and Oncology

## 2022-03-07 DIAGNOSIS — Z170 Estrogen receptor positive status [ER+]: Secondary | ICD-10-CM

## 2022-03-07 DIAGNOSIS — C50212 Malignant neoplasm of upper-inner quadrant of left female breast: Secondary | ICD-10-CM

## 2022-03-07 DIAGNOSIS — Z0189 Encounter for other specified special examinations: Secondary | ICD-10-CM | POA: Diagnosis not present

## 2022-03-07 DIAGNOSIS — I517 Cardiomegaly: Secondary | ICD-10-CM | POA: Insufficient documentation

## 2022-03-07 DIAGNOSIS — Z01818 Encounter for other preprocedural examination: Secondary | ICD-10-CM | POA: Diagnosis present

## 2022-03-07 LAB — ECHOCARDIOGRAM COMPLETE
Area-P 1/2: 3.81 cm2
Calc EF: 52.1 %
MV VTI: 2.25 cm2
S' Lateral: 3.3 cm
Single Plane A2C EF: 53 %
Single Plane A4C EF: 52.9 %

## 2022-03-07 NOTE — Progress Notes (Signed)
?  Echocardiogram ?2D Echocardiogram has been performed. ? ?Jillian Buchanan ?03/07/2022, 10:52 AM ?

## 2022-03-23 ENCOUNTER — Inpatient Hospital Stay: Payer: Medicare PPO

## 2022-03-23 ENCOUNTER — Other Ambulatory Visit: Payer: Self-pay

## 2022-03-23 VITALS — BP 135/65 | HR 73 | Temp 98.2°F | Resp 18 | Wt 216.8 lb

## 2022-03-23 DIAGNOSIS — Z5112 Encounter for antineoplastic immunotherapy: Secondary | ICD-10-CM | POA: Diagnosis not present

## 2022-03-23 DIAGNOSIS — C50212 Malignant neoplasm of upper-inner quadrant of left female breast: Secondary | ICD-10-CM

## 2022-03-23 MED ORDER — ACETAMINOPHEN 325 MG PO TABS
650.0000 mg | ORAL_TABLET | Freq: Once | ORAL | Status: AC
  Administered 2022-03-23: 650 mg via ORAL
  Filled 2022-03-23: qty 2

## 2022-03-23 MED ORDER — TRASTUZUMAB-HYALURONIDASE-OYSK 600-10000 MG-UNT/5ML ~~LOC~~ SOLN
600.0000 mg | Freq: Once | SUBCUTANEOUS | Status: AC
  Administered 2022-03-23: 600 mg via SUBCUTANEOUS
  Filled 2022-03-23: qty 5

## 2022-03-23 NOTE — Patient Instructions (Signed)
[facility]  Discharge Instructions: Thank you for choosing [facility] to provide your oncology and hematology care.   If you have a lab appointment with the Cancer Center, please go directly to the Cancer Center and check in at the registration area.   Wear comfortable clothing and clothing appropriate for easy access to any Portacath or PICC line.   We strive to give you quality time with your provider. You may need to reschedule your appointment if you arrive late (15 or more minutes).  Arriving late affects you and other patients whose appointments are after yours.  Also, if you miss three or more appointments without notifying the office, you may be dismissed from the clinic at the provider's discretion.      For prescription refill requests, have your pharmacy contact our office and allow 72 hours for refills to be completed.    Today you received the following chemotherapy and/or immunotherapy agents: Herceptin Hylecta.     To help prevent nausea and vomiting after your treatment, we encourage you to take your nausea medication as directed.  BELOW ARE SYMPTOMS THAT SHOULD BE REPORTED IMMEDIATELY: . *FEVER GREATER THAN 100.4 F (38 C) OR HIGHER . *CHILLS OR SWEATING . *NAUSEA AND VOMITING THAT IS NOT CONTROLLED WITH YOUR NAUSEA MEDICATION . *UNUSUAL SHORTNESS OF BREATH . *UNUSUAL BRUISING OR BLEEDING . *URINARY PROBLEMS (pain or burning when urinating, or frequent urination) . *BOWEL PROBLEMS (unusual diarrhea, constipation, pain near the anus) . TENDERNESS IN MOUTH AND THROAT WITH OR WITHOUT PRESENCE OF ULCERS (sore throat, sores in mouth, or a toothache) . UNUSUAL RASH, SWELLING OR PAIN  . UNUSUAL VAGINAL DISCHARGE OR ITCHING   Items with * indicate a potential emergency and should be followed up as soon as possible or go to the Emergency Department if any problems should occur.  Please show the CHEMOTHERAPY ALERT CARD or  IMMUNOTHERAPY ALERT CARD at check-in to the Emergency Department and triage nurse.  Should you have questions after your visit or need to cancel or reschedule your appointment, please contact [facility]  Dept: [phone]  and follow the prompts.  Office hours are 8:00 a.m. to 4:30 p.m. Monday - Friday. Please note that voicemails left after 4:00 p.m. may not be returned until the following business day.  We are closed weekends and major holidays. You have access to a nurse at all times for urgent questions. Please call the main number to the clinic Dept: [phone] and follow the prompts.   For any non-urgent questions, you may also contact your provider using MyChart. We now offer e-Visits for anyone 18 and older to request care online for non-urgent symptoms. For details visit [email or web address].   Also download the MyChart app! Go to the app store, search "MyChart", open the app, select [facility], and log in with your MyChart username and password.  Due to Covid, a mask is required upon entering the hospital/clinic. If you do not have a mask, one will be given to you upon arrival. For doctor visits, patients may have 1 support person aged 18 or older with them. For treatment visits, patients cannot have anyone with them due to current Covid guidelines and our immunocompromised population.   

## 2022-04-05 ENCOUNTER — Other Ambulatory Visit: Payer: Self-pay | Admitting: Oncology

## 2022-04-10 ENCOUNTER — Encounter: Payer: Self-pay | Admitting: *Deleted

## 2022-04-12 ENCOUNTER — Inpatient Hospital Stay: Payer: Medicare PPO | Admitting: Adult Health

## 2022-04-12 ENCOUNTER — Encounter (INDEPENDENT_AMBULATORY_CARE_PROVIDER_SITE_OTHER): Payer: Self-pay

## 2022-04-12 ENCOUNTER — Other Ambulatory Visit: Payer: Self-pay

## 2022-04-12 ENCOUNTER — Inpatient Hospital Stay: Payer: Medicare PPO | Attending: Oncology

## 2022-04-12 ENCOUNTER — Inpatient Hospital Stay: Payer: Medicare PPO

## 2022-04-12 ENCOUNTER — Telehealth: Payer: Self-pay | Admitting: Adult Health

## 2022-04-12 VITALS — BP 150/83 | HR 80 | Temp 97.5°F | Resp 17 | Ht 64.0 in | Wt 217.2 lb

## 2022-04-12 DIAGNOSIS — C50212 Malignant neoplasm of upper-inner quadrant of left female breast: Secondary | ICD-10-CM

## 2022-04-12 DIAGNOSIS — Z170 Estrogen receptor positive status [ER+]: Secondary | ICD-10-CM

## 2022-04-12 DIAGNOSIS — Z5112 Encounter for antineoplastic immunotherapy: Secondary | ICD-10-CM | POA: Diagnosis present

## 2022-04-12 LAB — CBC WITH DIFFERENTIAL (CANCER CENTER ONLY)
Abs Immature Granulocytes: 0.02 10*3/uL (ref 0.00–0.07)
Basophils Absolute: 0.1 10*3/uL (ref 0.0–0.1)
Basophils Relative: 1 %
Eosinophils Absolute: 0.2 10*3/uL (ref 0.0–0.5)
Eosinophils Relative: 4 %
HCT: 36 % (ref 36.0–46.0)
Hemoglobin: 11.6 g/dL — ABNORMAL LOW (ref 12.0–15.0)
Immature Granulocytes: 0 %
Lymphocytes Relative: 31 %
Lymphs Abs: 1.4 10*3/uL (ref 0.7–4.0)
MCH: 28.3 pg (ref 26.0–34.0)
MCHC: 32.2 g/dL (ref 30.0–36.0)
MCV: 87.8 fL (ref 80.0–100.0)
Monocytes Absolute: 0.6 10*3/uL (ref 0.1–1.0)
Monocytes Relative: 13 %
Neutro Abs: 2.3 10*3/uL (ref 1.7–7.7)
Neutrophils Relative %: 51 %
Platelet Count: 215 10*3/uL (ref 150–400)
RBC: 4.1 MIL/uL (ref 3.87–5.11)
RDW: 15.9 % — ABNORMAL HIGH (ref 11.5–15.5)
WBC Count: 4.6 10*3/uL (ref 4.0–10.5)
nRBC: 0 % (ref 0.0–0.2)

## 2022-04-12 LAB — CMP (CANCER CENTER ONLY)
ALT: 11 U/L (ref 0–44)
AST: 14 U/L — ABNORMAL LOW (ref 15–41)
Albumin: 4.1 g/dL (ref 3.5–5.0)
Alkaline Phosphatase: 64 U/L (ref 38–126)
Anion gap: 6 (ref 5–15)
BUN: 22 mg/dL (ref 8–23)
CO2: 28 mmol/L (ref 22–32)
Calcium: 9.9 mg/dL (ref 8.9–10.3)
Chloride: 106 mmol/L (ref 98–111)
Creatinine: 0.88 mg/dL (ref 0.44–1.00)
GFR, Estimated: >60 mL/min (ref >60)
Glucose, Bld: 103 mg/dL — ABNORMAL HIGH (ref 70–99)
Potassium: 3.7 mmol/L (ref 3.5–5.1)
Sodium: 140 mmol/L (ref 135–145)
Total Bilirubin: 0.4 mg/dL (ref 0.3–1.2)
Total Protein: 7.8 g/dL (ref 6.5–8.1)

## 2022-04-12 MED ORDER — TRASTUZUMAB-HYALURONIDASE-OYSK 600-10000 MG-UNT/5ML ~~LOC~~ SOLN
600.0000 mg | Freq: Once | SUBCUTANEOUS | Status: AC
  Administered 2022-04-12: 600 mg via SUBCUTANEOUS
  Filled 2022-04-12: qty 5

## 2022-04-12 MED ORDER — ACETAMINOPHEN 325 MG PO TABS
650.0000 mg | ORAL_TABLET | Freq: Once | ORAL | Status: AC
  Administered 2022-04-12: 650 mg via ORAL
  Filled 2022-04-12: qty 2

## 2022-04-12 NOTE — Patient Instructions (Addendum)
[facility]  Discharge Instructions: Thank you for choosing [facility] to provide your oncology and hematology care.   If you have a lab appointment with the Cancer Center, please go directly to the Cancer Center and check in at the registration area.   Wear comfortable clothing and clothing appropriate for easy access to any Portacath or PICC line.   We strive to give you quality time with your provider. You may need to reschedule your appointment if you arrive late (15 or more minutes).  Arriving late affects you and other patients whose appointments are after yours.  Also, if you miss three or more appointments without notifying the office, you may be dismissed from the clinic at the provider's discretion.      For prescription refill requests, have your pharmacy contact our office and allow 72 hours for refills to be completed.    Today you received the following chemotherapy and/or immunotherapy agent: Trastuzumab Hyaluronidase (Herceptin Hylecta).   To help prevent nausea and vomiting after your treatment, we encourage you to take your nausea medication as directed.  BELOW ARE SYMPTOMS THAT SHOULD BE REPORTED IMMEDIATELY: *FEVER GREATER THAN 100.4 F (38 C) OR HIGHER *CHILLS OR SWEATING *NAUSEA AND VOMITING THAT IS NOT CONTROLLED WITH YOUR NAUSEA MEDICATION *UNUSUAL SHORTNESS OF BREATH *UNUSUAL BRUISING OR BLEEDING *URINARY PROBLEMS (pain or burning when urinating, or frequent urination) *BOWEL PROBLEMS (unusual diarrhea, constipation, pain near the anus) TENDERNESS IN MOUTH AND THROAT WITH OR WITHOUT PRESENCE OF ULCERS (sore throat, sores in mouth, or a toothache) UNUSUAL RASH, SWELLING OR PAIN  UNUSUAL VAGINAL DISCHARGE OR ITCHING   Items with * indicate a potential emergency and should be followed up as soon as possible or go to the Emergency Department if any problems should occur.  Please show the CHEMOTHERAPY ALERT CARD or  IMMUNOTHERAPY ALERT CARD at check-in to the Emergency Department and triage nurse.  Should you have questions after your visit or need to cancel or reschedule your appointment, please contact [facility]  Dept: [phone]  and follow the prompts.  Office hours are 8:00 a.m. to 4:30 p.m. Monday - Friday. Please note that voicemails left after 4:00 p.m. may not be returned until the following business day.  We are closed weekends and major holidays. You have access to a nurse at all times for urgent questions. Please call the main number to the clinic Dept: [phone] and follow the prompts.   For any non-urgent questions, you may also contact your provider using MyChart. We now offer e-Visits for anyone 18 and older to request care online for non-urgent symptoms. For details visit [email or web address].   Also download the MyChart app! Go to the app store, search "MyChart", open the app, select [facility], and log in with your MyChart username and password.  Due to Covid, a mask is required upon entering the hospital/clinic. If you do not have a mask, one will be given to you upon arrival. For doctor visits, patients may have 1 support person aged 18 or older with them. For treatment visits, patients cannot have anyone with them due to current Covid guidelines and our immunocompromised population.  

## 2022-04-12 NOTE — Progress Notes (Signed)
Orders for MM faxed to [facility] per DNP. Instructions added to call pt and schedule. Received fax completion.

## 2022-04-12 NOTE — Telephone Encounter (Signed)
Scheduled appointment per 6/13 los. Left message.

## 2022-04-13 ENCOUNTER — Encounter: Payer: Self-pay | Admitting: Hematology and Oncology

## 2022-04-13 NOTE — Assessment & Plan Note (Signed)
Jillian Buchanan is an 88 year old woman with stage IA triple positive breast cancer s/p lumpectomy and currently receiving maintenance trastuzumab that began in 07/2021 along with anastrozole which started in 07/2021 as well.    Jillian Buchanan is tolerating her treatment well.  She has no clinical signs of breast cancer recurrence and her echocardiogram is up to date.  I will order repeat mammogram to be completed this month.  I ordered repeat echocardiogram to be completed in 05/2022.    Jillian Buchanan will return every 3 weeks for Herceptin and we will conduct her SCP visit in 05/2022.  I discussed with her in detail what this entails and she is looking forward to it.

## 2022-04-13 NOTE — Progress Notes (Signed)
[facility] Follow up:    Seward Carol, MD [address] [place] [address]   DIAGNOSIS:  Cancer Staging  Malignant neoplasm of upper-inner quadrant of left breast in female, estrogen receptor positive ([place]) Staging form: Breast, AJCC 8th Edition - Clinical stage from 05/26/2021: Stage IA (cT1b, cN0, cM0, G2, ER+, PR+, HER2+) - Signed by Chauncey Cruel, MD on 05/26/2021 Stage prefix: Initial diagnosis Histologic grading system: 3 grade system Laterality: Left Staged by: Pathologist and managing physician Stage used in treatment planning: Yes National guidelines used in treatment planning: Yes Type of national guideline used in treatment planning: NCCN   SUMMARY OF ONCOLOGIC HISTORY: Oncology History  Malignant neoplasm of upper-inner quadrant of left breast in female, estrogen receptor positive ([place])  05/18/2021 Initial Diagnosis    left breast upper inner quadrant biopsy 05/18/2021 for a clinical T1b N0, stage IA invasive ductal carcinoma, triple positive, with an MIB-1 of 15%   05/26/2021 Cancer Staging   Staging form: Breast, AJCC 8th Edition - Clinical stage from 05/26/2021: Stage IA (cT1b, cN0, cM0, G2, ER+, PR+, HER2+) - Signed by Chauncey Cruel, MD on 05/26/2021 Stage prefix: Initial diagnosis Histologic grading system: 3 grade system Laterality: Left Staged by: Pathologist and managing physician Stage used in treatment planning: Yes National guidelines used in treatment planning: Yes Type of national guideline used in treatment planning: NCCN   06/15/2021 Surgery   left lumpectomy 06/15/2021 for a pT1c pNX invasive ductal carcinoma grade 2, with negative margins   06/18/2021 Genetic Testing   Negative genetic testing:  No pathogenic variants detected on the [employer] CancerNext-Expanded + RNAinsight panel. Three variants of uncertain significance (VUS) were detected - one in the ATM gene called p.F1265L (c.3793T>C), a second in the  LTZR1 gene called c.2414delA ([id number]*1), and a third in the [place] gene called p.P58L (c.173C>T). The report date is 06/18/2021.  The CancerNext-Expanded + RNAinsight gene panel offered by [employer] and includes sequencing and rearrangement analysis for the following 77 genes: AIP, ALK, APC, ATM, AXIN2, BAP1, BARD1, BLM, BMPR1A, BRCA1, BRCA2, BRIP1, CDC73, CDH1, CDK4, CDKN1B, CDKN2A, CHEK2, CTNNA1, DICER1, FANCC, FH, FLCN, GALNT12, KIF1B, LZTR1, MAX, MEN1, MET, MLH1, MSH2, MSH3, MSH6, MUTYH, NBN, NF1, NF2, NTHL1, PALB2, PHOX2B, PMS2, POT1, PRKAR1A, PTCH1, PTEN, RAD51C, RAD51D, RB1, RECQL, RET, SDHA, SDHAF2, SDHB, SDHC, SDHD, SMAD4, SMARCA4, SMARCB1, SMARCE1, STK11, SUFU, TMEM127, TP53, TSC1, TSC2, VHL and XRCC2 (sequencing and deletion/duplication); EGFR, EGLN1, HOXB13, KIT, MITF, PDGFRA, POLD1 and POLE (sequencing only); EPCAM and GREM1 (deletion/duplication only). RNA data is routinely analyzed for use in variant interpretation for all genes.    07/12/2021 -  Anti-estrogen oral therapy   anastrozole started 07/12/2021   07/15/2021 -  Chemotherapy   Patient is on Treatment Plan : BREAST Trastuzumab q21d       CURRENT THERAPY: Anastrozole/Trastuzumab  INTERVAL HISTORY: ALEXANDR OEHLER 88 y.o. female returns for follow-up of her history of breast cancer currently on treatment with Trastuzumab given every 3 weeks and anastrozole daily.  She is tolerating both of these treatments well.  Her most recent echocardiogram was completed on 03/07/2022 and showed a normal LVEF of 55-60% and normal global longitudinal strain.     Patient Active Problem List   Diagnosis Date Noted   Low back pain 09/02/2021   Trigger finger, right middle finger 08/24/2021   Genetic testing 06/18/2021   Family history of breast cancer 05/26/2021   Family history of bone cancer 05/26/2021   Malignant  neoplasm of upper-inner quadrant of left breast in female, estrogen receptor positive ([place]) 05/24/2021   Bilateral primary  osteoarthritis of knee 11/12/2019   Obesity 11/12/2019   Chronic right shoulder pain 11/27/2017   Unilateral primary osteoarthritis, left knee 11/27/2017    has No Known Allergies.  MEDICAL HISTORY: Past Medical History:  Diagnosis Date   Anxiety    self reported   Breast cancer ([place])    Cataract    Family history of bone cancer    Family history of breast cancer    Hypertension     SURGICAL HISTORY: Past Surgical History:  Procedure Laterality Date   ABDOMINAL HYSTERECTOMY     BREAST LUMPECTOMY  1988   BREAST LUMPECTOMY WITH RADIOACTIVE SEED LOCALIZATION Left 06/15/2021   Procedure: LEFT BREAST LUMPECTOMY WITH RADIOACTIVE SEED LOCALIZATION;  Surgeon: Rolm Bookbinder, MD;  Location: [facility];  Service: General;  Laterality: Left;   INTRAOCULAR PROSTHESES INSERTION  8/248/2005    SOCIAL HISTORY: Social History   Socioeconomic History   Marital status: Married    Spouse name: Not on file   Number of children: Not on file   Years of education: Not on file   Highest education level: Not on file  Occupational History   Not on file  Tobacco Use   Smoking status: Never   Smokeless tobacco: Never  Vaping Use   Vaping Use: Never used  Substance and Sexual Activity   Alcohol use: Yes    Comment: rarely, social   Drug use: No   Sexual activity: Never  Other Topics Concern   Not on file  Social History Narrative   Not on file   Social Determinants of Health   Financial Resource Strain: Not on file  Food Insecurity: No Food Insecurity (05/26/2021)   Hunger Vital Sign    Worried About Running Out of Food in the Last Year: Never true    Ran Out of Food in the Last Year: Never true  Transportation Needs: No Transportation Needs (05/26/2021)   PRAPARE - Hydrologist (Medical): No    Lack of Transportation (Non-Medical): No  Physical Activity: Not on file  Stress: Not on file  Social Connections: Not on file  Intimate  Partner Violence: Not on file    FAMILY HISTORY: Family History  Problem Relation Age of Onset   Breast cancer Maternal Aunt        x5 aunts, dx >50   Breast cancer Paternal Aunt        dx >50   Breast cancer Cousin        x4 maternal first cousins   Bone cancer Daughter 72    Review of Systems  Constitutional:  Negative for appetite change, chills, fatigue, fever and unexpected weight change.  HENT:   Negative for hearing loss, lump/mass and trouble swallowing.   Eyes:  Negative for eye problems and icterus.  Respiratory:  Negative for chest tightness, cough and shortness of breath.   Cardiovascular:  Negative for chest pain, leg swelling and palpitations.  Gastrointestinal:  Negative for abdominal distention, abdominal pain, constipation, diarrhea, nausea and vomiting.  Endocrine: Negative for hot flashes.  Genitourinary:  Negative for difficulty urinating.   Musculoskeletal:  Negative for arthralgias.  Skin:  Negative for itching and rash.  Neurological:  Negative for dizziness, extremity weakness, headaches and numbness.  Hematological:  Negative for adenopathy. Does not bruise/bleed easily.  Psychiatric/Behavioral:  Negative for depression. The patient is not nervous/anxious.  PHYSICAL EXAMINATION  ECOG PERFORMANCE STATUS: 1 - Symptomatic but completely ambulatory  Vitals:   04/12/22 1340  BP: (!) 150/83  Pulse: 80  Resp: 17  Temp: (!) 97.5 F (36.4 C)  SpO2: 98%    Physical Exam Constitutional:      General: She is not in acute distress.    Appearance: Normal appearance. She is not toxic-appearing.  HENT:     Head: Normocephalic and atraumatic.  Eyes:     General: No scleral icterus. Cardiovascular:     Rate and Rhythm: Normal rate and regular rhythm.     Pulses: Normal pulses.     Heart sounds: Normal heart sounds.  Pulmonary:     Effort: Pulmonary effort is normal.     Breath sounds: Normal breath sounds.  Chest:     Comments: Left breast s/p  lumpectomy, no sign of local recurrence, right breast benign Abdominal:     General: Abdomen is flat. Bowel sounds are normal. There is no distension.     Palpations: Abdomen is soft.     Tenderness: There is no abdominal tenderness.  Musculoskeletal:        General: No swelling.     Cervical back: Neck supple.  Lymphadenopathy:     Cervical: No cervical adenopathy.  Skin:    General: Skin is warm and dry.     Findings: No rash.  Neurological:     General: No focal deficit present.     Mental Status: She is alert.  Psychiatric:        Mood and Affect: Mood normal.        Behavior: Behavior normal.     LABORATORY DATA:  CBC    Component Value Date/Time   WBC 4.6 04/12/2022 1325   WBC 4.2 12/09/2021 1123   RBC 4.10 04/12/2022 1325   HGB 11.6 (L) 04/12/2022 1325   HGB 12.3 09/13/2010 0929   HCT 36.0 04/12/2022 1325   HCT 36.3 09/13/2010 0929   PLT 215 04/12/2022 1325   PLT 233 09/13/2010 0929   MCV 87.8 04/12/2022 1325   MCV 84.9 09/13/2010 0929   MCH 28.3 04/12/2022 1325   MCHC 32.2 04/12/2022 1325   RDW 15.9 (H) 04/12/2022 1325   RDW 15.0 (H) 09/13/2010 0929   LYMPHSABS 1.4 04/12/2022 1325   LYMPHSABS 1.4 09/13/2010 0929   MONOABS 0.6 04/12/2022 1325   MONOABS 0.6 09/13/2010 0929   EOSABS 0.2 04/12/2022 1325   EOSABS 0.2 09/13/2010 0929   BASOSABS 0.1 04/12/2022 1325   BASOSABS 0.0 09/13/2010 0929    CMP     Component Value Date/Time   NA 140 04/12/2022 1325   K 3.7 04/12/2022 1325   CL 106 04/12/2022 1325   CO2 28 04/12/2022 1325   GLUCOSE 103 (H) 04/12/2022 1325   BUN 22 04/12/2022 1325   CREATININE 0.88 04/12/2022 1325   CALCIUM 9.9 04/12/2022 1325   PROT 7.8 04/12/2022 1325   ALBUMIN 4.1 04/12/2022 1325   AST 14 (L) 04/12/2022 1325   ALT 11 04/12/2022 1325   ALKPHOS 64 04/12/2022 1325   BILITOT 0.4 04/12/2022 1325   GFRNONAA >60 04/12/2022 1325    ASSESSMENT and THERAPY PLAN:   Malignant neoplasm of upper-inner quadrant of left breast in  female, estrogen receptor positive ([place]) Jillian Buchanan is an 88 year old woman with stage IA triple positive breast cancer s/p lumpectomy and currently receiving maintenance trastuzumab that began in 07/2021 along with anastrozole which started in 07/2021 as well.  Uriah is tolerating her treatment well.  She has no clinical signs of breast cancer recurrence and her echocardiogram is up to date.  I will order repeat mammogram to be completed this month.  I ordered repeat echocardiogram to be completed in 05/2022.    Shameka will return every 3 weeks for Herceptin and we will conduct her SCP visit in 05/2022.  I discussed with her in detail what this entails and she is looking forward to it.    All questions were answered. The patient knows to call the clinic with any problems, questions or concerns. We can certainly see the patient much sooner if necessary.  Total encounter time:20 minutes*in face-to-face visit time, chart review, lab review, care coordination, order entry, and documentation of the encounter time.    [name], NP 04/13/22 9:20 PM Medical Oncology and Hematology [facility] [place], [place] [address] Tel. [phone]    Fax. [phone]  *Total Encounter Time as defined by the Centers for Medicare and Medicaid Services includes, in addition to the face-to-face time of a patient visit (documented in the note above) non-face-to-face time: obtaining and reviewing outside history, ordering and reviewing medications, tests or procedures, care coordination (communications with other health care professionals or caregivers) and documentation in the medical record.

## 2022-04-21 ENCOUNTER — Encounter: Payer: Self-pay | Admitting: *Deleted

## 2022-05-04 ENCOUNTER — Inpatient Hospital Stay: Payer: Medicare PPO | Attending: Oncology

## 2022-05-04 ENCOUNTER — Other Ambulatory Visit: Payer: Self-pay

## 2022-05-04 VITALS — BP 136/64 | HR 69 | Temp 98.2°F | Resp 18 | Wt 215.0 lb

## 2022-05-04 DIAGNOSIS — C50212 Malignant neoplasm of upper-inner quadrant of left female breast: Secondary | ICD-10-CM | POA: Diagnosis present

## 2022-05-04 DIAGNOSIS — Z170 Estrogen receptor positive status [ER+]: Secondary | ICD-10-CM | POA: Diagnosis not present

## 2022-05-04 DIAGNOSIS — Z5112 Encounter for antineoplastic immunotherapy: Secondary | ICD-10-CM | POA: Insufficient documentation

## 2022-05-04 MED ORDER — TRASTUZUMAB-HYALURONIDASE-OYSK 600-10000 MG-UNT/5ML ~~LOC~~ SOLN
600.0000 mg | Freq: Once | SUBCUTANEOUS | Status: AC
  Administered 2022-05-04: 600 mg via SUBCUTANEOUS
  Filled 2022-05-04: qty 5

## 2022-05-04 MED ORDER — ACETAMINOPHEN 325 MG PO TABS
650.0000 mg | ORAL_TABLET | Freq: Once | ORAL | Status: AC
  Administered 2022-05-04: 650 mg via ORAL
  Filled 2022-05-04: qty 2

## 2022-05-04 NOTE — Patient Instructions (Signed)
[facility]  Discharge Instructions: Thank you for choosing [facility] to provide your oncology and hematology care.   If you have a lab appointment with the [facility], please go directly to the [facility] and check in at the registration area.   Wear comfortable clothing and clothing appropriate for easy access to any Portacath or PICC line.   We strive to give you quality time with your provider. You may need to reschedule your appointment if you arrive late (15 or more minutes).  Arriving late affects you and other patients whose appointments are after yours.  Also, if you miss three or more appointments without notifying the office, you may be dismissed from the clinic at the provider's discretion.      For prescription refill requests, have your pharmacy contact our office and allow 72 hours for refills to be completed.    Today you received the following chemotherapy and/or immunotherapy agent: Trastuzumab Hyaluronidase (Herceptin Hylecta).   To help prevent nausea and vomiting after your treatment, we encourage you to take your nausea medication as directed.  BELOW ARE SYMPTOMS THAT SHOULD BE REPORTED IMMEDIATELY: *FEVER GREATER THAN 100.4 F (38 C) OR HIGHER *CHILLS OR SWEATING *NAUSEA AND VOMITING THAT IS NOT CONTROLLED WITH YOUR NAUSEA MEDICATION *UNUSUAL SHORTNESS OF BREATH *UNUSUAL BRUISING OR BLEEDING *URINARY PROBLEMS (pain or burning when urinating, or frequent urination) *BOWEL PROBLEMS (unusual diarrhea, constipation, pain near the anus) TENDERNESS IN MOUTH AND THROAT WITH OR WITHOUT PRESENCE OF ULCERS (sore throat, sores in mouth, or a toothache) UNUSUAL RASH, SWELLING OR PAIN  UNUSUAL VAGINAL DISCHARGE OR ITCHING   Items with * indicate a potential emergency and should be followed up as soon as possible or go to the Emergency Department if any problems should occur.  Please show the CHEMOTHERAPY ALERT CARD or  IMMUNOTHERAPY ALERT CARD at check-in to the Emergency Department and triage nurse.  Should you have questions after your visit or need to cancel or reschedule your appointment, please contact [facility]  Dept: [phone]  and follow the prompts.  Office hours are 8:00 a.m. to 4:30 p.m. Monday - Friday. Please note that voicemails left after 4:00 p.m. may not be returned until the following business day.  We are closed weekends and major holidays. You have access to a nurse at all times for urgent questions. Please call the main number to the clinic Dept: [phone] and follow the prompts.   For any non-urgent questions, you may also contact your provider using MyChart. We now offer e-Visits for anyone 85 and older to request care online for non-urgent symptoms. For details visit [email or web address].   Also download the MyChart app! Go to the app store, search "MyChart", open the app, select [facility], and log in with your MyChart username and password.  Due to Covid, a mask is required upon entering the hospital/clinic. If you do not have a mask, one will be given to you upon arrival. For doctor visits, patients may have 1 support person aged 67 or older with them. For treatment visits, patients cannot have anyone with them due to current Covid guidelines and our immunocompromised population.

## 2022-05-23 ENCOUNTER — Other Ambulatory Visit: Payer: Self-pay

## 2022-05-25 ENCOUNTER — Other Ambulatory Visit: Payer: Self-pay

## 2022-05-25 ENCOUNTER — Inpatient Hospital Stay: Payer: Medicare PPO

## 2022-05-25 VITALS — BP 153/82 | HR 72 | Temp 98.2°F | Resp 18 | Wt 215.8 lb

## 2022-05-25 DIAGNOSIS — C50212 Malignant neoplasm of upper-inner quadrant of left female breast: Secondary | ICD-10-CM

## 2022-05-25 DIAGNOSIS — Z5112 Encounter for antineoplastic immunotherapy: Secondary | ICD-10-CM | POA: Diagnosis not present

## 2022-05-25 MED ORDER — TRASTUZUMAB-HYALURONIDASE-OYSK 600-10000 MG-UNT/5ML ~~LOC~~ SOLN
600.0000 mg | Freq: Once | SUBCUTANEOUS | Status: AC
  Administered 2022-05-25: 600 mg via SUBCUTANEOUS
  Filled 2022-05-25: qty 5

## 2022-05-25 MED ORDER — ACETAMINOPHEN 325 MG PO TABS
650.0000 mg | ORAL_TABLET | Freq: Once | ORAL | Status: AC
  Administered 2022-05-25: 650 mg via ORAL
  Filled 2022-05-25: qty 2

## 2022-05-25 NOTE — Patient Instructions (Signed)
[facility]COL[facility]  Discharge Instructions: Thank you for choosing [facility] to provide your oncology and hematology care.   If you have a lab appointment with the [facility], please go directly to the [facility] and check in at the registration area.   Wear comfortable clothing and clothing appropriate for easy access to any Portacath or PICC line.   We strive to give you quality time with your provider. You may need to reschedule your appointment if you arrive late (15 or more minutes).  Arriving late affects you and other patients whose appointments are after yours.  Also, if you miss three or more appointments without notifying the office, you may be dismissed from the clinic at the provider's discretion.      For prescription refill requests, have your pharmacy contact our office and allow 72 hours for refills to be completed.    Today you received the following chemotherapy and/or immunotherapy agents: Herceptin Hylecta      To help prevent nausea and vomiting after your treatment, we encourage you to take your nausea medication as directed.  BELOW ARE SYMPTOMS THAT SHOULD BE REPORTED IMMEDIATELY: *FEVER GREATER THAN 100.4 F (38 C) OR HIGHER *CHILLS OR SWEATING *NAUSEA AND VOMITING THAT IS NOT CONTROLLED WITH YOUR NAUSEA MEDICATION *UNUSUAL SHORTNESS OF BREATH *UNUSUAL BRUISING OR BLEEDING *URINARY PROBLEMS (pain or burning when urinating, or frequent urination) *BOWEL PROBLEMS (unusual diarrhea, constipation, pain near the anus) TENDERNESS IN MOUTH AND THROAT WITH OR WITHOUT PRESENCE OF ULCERS (sore throat, sores in mouth, or a toothache) UNUSUAL RASH, SWELLING OR PAIN  UNUSUAL VAGINAL DISCHARGE OR ITCHING   Items with * indicate a potential emergency and should be followed up as soon as possible or go to the Emergency Department if any problems should occur.  Please show the CHEMOTHERAPY ALERT CARD or IMMUNOTHERAPY ALERT CARD at  check-in to the Emergency Department and triage nurse.  Should you have questions after your visit or need to cancel or reschedule your appointment, please contact [facility]  Dept: [phone]  and follow the prompts.  Office hours are 8:00 a.m. to 4:30 p.m. Monday - Friday. Please note that voicemails left after 4:00 p.m. may not be returned until the following business day.  We are closed weekends and major holidays. You have access to a nurse at all times for urgent questions. Please call the main number to the clinic Dept: [phone] and follow the prompts.   For any non-urgent questions, you may also contact your provider using MyChart. We now offer e-Visits for anyone 62 and older to request care online for non-urgent symptoms. For details visit [email or web address].   Also download the MyChart app! Go to the app store, search "MyChart", open the app, select [facility], and log in with your MyChart username and password.  Masks are optional in the cancer centers. If you would like for your care team to wear a mask while they are taking care of you, please let them know. For doctor visits, patients may have with them one support person who is at least 87 years old. At this time, visitors are not allowed in the infusion area.

## 2022-05-30 ENCOUNTER — Ambulatory Visit (HOSPITAL_COMMUNITY)
Admission: RE | Admit: 2022-05-30 | Discharge: 2022-05-30 | Disposition: A | Discharge status: Home / Self Care | Payer: Medicare PPO | Source: Ambulatory Visit | Attending: Adult Health | Admitting: Adult Health

## 2022-05-30 ENCOUNTER — Telehealth: Payer: Self-pay | Admitting: Adult Health

## 2022-05-30 DIAGNOSIS — Z01818 Encounter for other preprocedural examination: Secondary | ICD-10-CM | POA: Diagnosis present

## 2022-05-30 DIAGNOSIS — C50212 Malignant neoplasm of upper-inner quadrant of left female breast: Secondary | ICD-10-CM | POA: Diagnosis not present

## 2022-05-30 DIAGNOSIS — Z170 Estrogen receptor positive status [ER+]: Secondary | ICD-10-CM | POA: Diagnosis not present

## 2022-05-30 DIAGNOSIS — Z0189 Encounter for other specified special examinations: Secondary | ICD-10-CM

## 2022-05-30 DIAGNOSIS — I10 Essential (primary) hypertension: Secondary | ICD-10-CM | POA: Diagnosis not present

## 2022-05-30 LAB — ECHOCARDIOGRAM COMPLETE
AR max vel: 2.27 cm2
AV Peak grad: 8.6 mmHg
Ao pk vel: 1.47 m/s
Area-P 1/2: 3.2 cm2
S' Lateral: 4 cm

## 2022-05-30 NOTE — Telephone Encounter (Signed)
error 

## 2022-05-31 ENCOUNTER — Other Ambulatory Visit: Payer: Self-pay

## 2022-05-31 DIAGNOSIS — C50212 Malignant neoplasm of upper-inner quadrant of left female breast: Secondary | ICD-10-CM

## 2022-05-31 NOTE — Progress Notes (Signed)
Spoke with pt about echo results - Pt seemed very pleased with results.  Informed her that Mendel Ryder recommended her to see cardio-oncology and that a referral was placed with Dr. Harl Bowie. Pt verbalized understanding and was told to call with any questions or concerns.  Per [name], NP - Placed referral for cardiology-oncology with Dr. Harl Bowie at [facility]. Spoke with Phiao and confirmed that the referral is in.

## 2022-06-13 ENCOUNTER — Encounter: Payer: Self-pay | Admitting: *Deleted

## 2022-06-15 ENCOUNTER — Inpatient Hospital Stay: Payer: Medicare PPO | Attending: Oncology

## 2022-06-15 ENCOUNTER — Inpatient Hospital Stay (HOSPITAL_BASED_OUTPATIENT_CLINIC_OR_DEPARTMENT_OTHER): Payer: Medicare PPO | Admitting: Adult Health

## 2022-06-15 ENCOUNTER — Encounter: Payer: Self-pay | Admitting: Surgery

## 2022-06-15 ENCOUNTER — Other Ambulatory Visit: Payer: Self-pay

## 2022-06-15 ENCOUNTER — Inpatient Hospital Stay: Payer: Medicare PPO

## 2022-06-15 ENCOUNTER — Encounter: Payer: Self-pay | Admitting: Adult Health

## 2022-06-15 VITALS — BP 151/85 | HR 72 | Temp 97.5°F | Resp 16 | Ht 64.0 in | Wt 213.4 lb

## 2022-06-15 DIAGNOSIS — Z170 Estrogen receptor positive status [ER+]: Secondary | ICD-10-CM

## 2022-06-15 DIAGNOSIS — I10 Essential (primary) hypertension: Secondary | ICD-10-CM | POA: Insufficient documentation

## 2022-06-15 DIAGNOSIS — C50212 Malignant neoplasm of upper-inner quadrant of left female breast: Secondary | ICD-10-CM

## 2022-06-15 DIAGNOSIS — Z5112 Encounter for antineoplastic immunotherapy: Secondary | ICD-10-CM | POA: Diagnosis present

## 2022-06-15 DIAGNOSIS — Z79811 Long term (current) use of aromatase inhibitors: Secondary | ICD-10-CM | POA: Insufficient documentation

## 2022-06-15 LAB — CMP (CANCER CENTER ONLY)
ALT: 13 U/L (ref 0–44)
AST: 16 U/L (ref 15–41)
Albumin: 4.1 g/dL (ref 3.5–5.0)
Alkaline Phosphatase: 72 U/L (ref 38–126)
Anion gap: 5 (ref 5–15)
BUN: 23 mg/dL (ref 8–23)
CO2: 26 mmol/L (ref 22–32)
Calcium: 9.7 mg/dL (ref 8.9–10.3)
Chloride: 107 mmol/L (ref 98–111)
Creatinine: 0.91 mg/dL (ref 0.44–1.00)
GFR, Estimated: >60 mL/min (ref >60)
Glucose, Bld: 99 mg/dL (ref 70–99)
Potassium: 3.9 mmol/L (ref 3.5–5.1)
Sodium: 138 mmol/L (ref 135–145)
Total Bilirubin: 0.5 mg/dL (ref 0.3–1.2)
Total Protein: 7.8 g/dL (ref 6.5–8.1)

## 2022-06-15 LAB — CBC WITH DIFFERENTIAL (CANCER CENTER ONLY)
Abs Immature Granulocytes: 0 10*3/uL (ref 0.00–0.07)
Basophils Absolute: 0 10*3/uL (ref 0.0–0.1)
Basophils Relative: 1 %
Eosinophils Absolute: 0.2 10*3/uL (ref 0.0–0.5)
Eosinophils Relative: 4 %
HCT: 35.3 % — ABNORMAL LOW (ref 36.0–46.0)
Hemoglobin: 11.8 g/dL — ABNORMAL LOW (ref 12.0–15.0)
Immature Granulocytes: 0 %
Lymphocytes Relative: 34 %
Lymphs Abs: 1.3 10*3/uL (ref 0.7–4.0)
MCH: 29 pg (ref 26.0–34.0)
MCHC: 33.4 g/dL (ref 30.0–36.0)
MCV: 86.7 fL (ref 80.0–100.0)
Monocytes Absolute: 0.5 10*3/uL (ref 0.1–1.0)
Monocytes Relative: 12 %
Neutro Abs: 1.9 10*3/uL (ref 1.7–7.7)
Neutrophils Relative %: 49 %
Platelet Count: 211 10*3/uL (ref 150–400)
RBC: 4.07 MIL/uL (ref 3.87–5.11)
RDW: 15.5 % (ref 11.5–15.5)
WBC Count: 3.9 10*3/uL — ABNORMAL LOW (ref 4.0–10.5)
nRBC: 0 % (ref 0.0–0.2)

## 2022-06-15 MED ORDER — CARVEDILOL 3.125 MG PO TABS: 3.1250 mg | ORAL_TABLET | Freq: Two times a day (BID) | ORAL | 0 refills | Status: DC

## 2022-06-15 MED ORDER — ACETAMINOPHEN 325 MG PO TABS
650.0000 mg | ORAL_TABLET | Freq: Once | ORAL | Status: AC
  Administered 2022-06-15: 650 mg via ORAL

## 2022-06-15 MED ORDER — TRASTUZUMAB-HYALURONIDASE-OYSK 600-10000 MG-UNT/5ML ~~LOC~~ SOLN
600.0000 mg | Freq: Once | SUBCUTANEOUS | Status: AC
  Administered 2022-06-15: 600 mg via SUBCUTANEOUS
  Filled 2022-06-15: qty 5

## 2022-06-15 NOTE — Progress Notes (Signed)
Pt is taking Anastrozole as prescribed-she is having hot flashes and sweating, as well as loose BMs.  [name], NP, notified.

## 2022-06-15 NOTE — Patient Instructions (Signed)
Carvedilol Tablets What is this medication? CARVEDILOL (KAR ve dil ol) treats high blood pressure and heart failure. It may also be used to prevent further damage after a heart attack. It works by lowering your blood pressure and heart rate, making it easier for your heart to pump blood to the rest of your body. It belongs to a group of medications called beta blockers. This medicine may be used for other purposes; ask your health care provider or pharmacist if you have questions. COMMON BRAND NAME(S): Coreg What should I tell my care team before I take this medication? They need to know if you have any of these conditions: Circulation problems Diabetes History of heart attack or heart disease Liver disease Lung or breathing disease, like asthma Pheochromocytoma Slow or irregular heartbeat Thyroid disease An unusual or allergic reaction to carvedilol, other beta blockers, medications, foods, dyes, or preservatives Pregnant or trying to get pregnant Breast-feeding How should I use this medication? Take this medication by mouth. Take it as directed on the prescription label at the same time every day. Take it with food. Keep taking it unless your care team tells you to stop. Talk to your care team about the use of this medication in children. Special care may be needed. Overdosage: If you think you have taken too much of this medicine contact a poison control center or emergency room at once. NOTE: This medicine is only for you. Do not share this medicine with others. What if I miss a dose? If you miss a dose, take it as soon as you can. If it is almost time for your next dose, take only that dose. Do not take double or extra doses. What may interact with this medication? This medication may interact with the following: Certain medications for blood pressure, heart disease, irregular heartbeat Certain medications for depression, like fluoxetine or paroxetine Certain medications for  diabetes, like glipizide or glyburide Cimetidine Clonidine Cyclosporine Digoxin MAOIs like Carbex, Eldepryl, Marplan, Nardil, and Parnate Reserpine Rifampin This list may not describe all possible interactions. Give your health care provider a list of all the medicines, herbs, non-prescription drugs, or dietary supplements you use. Also tell them if you smoke, drink alcohol, or use illegal drugs. Some items may interact with your medicine. What should I watch for while using this medication? Visit your care team for regular checks on your progress. Check your blood pressure as directed. Ask your care team what your blood pressure should be. Also, find out when you should contact them. Do not treat yourself for coughs, colds, or pain while you are using this medication without asking your care team for advice. Some medications may increase your blood pressure. You may get drowsy or dizzy. Do not drive, use machinery, or do anything that needs mental alertness until you know how this medication affects you. Do not stand up or sit up quickly, especially if you are an older patient. This reduces the risk of dizzy or fainting spells. Alcohol may interfere with the effect of this medication. Avoid alcoholic drinks. This medication may increase blood sugar. Ask your care team if changes in diet or medications are needed if you have diabetes. If you are going to need surgery or another procedure, tell your care team that you are using this medication. What side effects may I notice from receiving this medication? Side effects that you should report to your care team as soon as possible: Allergic reactions--skin rash, itching, hives, swelling of the face, lips,  tongue, or throat Heart failure--shortness of breath, swelling of the ankles, feet, or hands, sudden weight gain, unusual weakness or fatigue Low blood pressure--dizziness, feeling faint or lightheaded, blurry vision Raynaud's--cool, numb, or  painful fingers or toes that may change color from pale, to blue, to red Slow heartbeat--dizziness, feeling faint or lightheaded, confusion, trouble breathing, unusual weakness or fatigue Worsening mood, feelings of depression Side effects that usually do not require medical attention (report to your care team if they continue or are bothersome): Change in sex drive or performance Diarrhea Dizziness Fatigue Headache This list may not describe all possible side effects. Call your doctor for medical advice about side effects. You may report side effects to FDA at 1-800-FDA-1088. Where should I keep my medication? Keep out of the reach of children and pets. Store at room temperature between 20 and 25 degrees C (68 and 77 degrees F). Protect from moisture. Keep the container tightly closed. Throw away any unused medication after the expiration date. NOTE: This sheet is a summary. It may not cover all possible information. If you have questions about this medicine, talk to your doctor, pharmacist, or health care provider.  2023 Elsevier/Gold Standard (2019-05-24 00:00:00)

## 2022-06-15 NOTE — Progress Notes (Signed)
Per [name], NP, I called the office of Dr. Phineas Inches to get the pt scheduled for an appointment, since the referral was sent 05/31/22.  The earliest available appointment to see Dr. Harl Bowie was September 11, at 11 am.  The office stated that the pt would be put on a waiting list and would be called if Dr. Harl Bowie had a cancellation.  The office also requested that the pt come 15 min early, and bring a list of her medications, her insurance card, and photo ID.    The pt was already in the treatment room receiving chemo when the appointment was scheduled, so the pt's treatment nurse Lulu Riding notified the pt.  The pt verbalized understanding of the appointment and instructions.

## 2022-06-15 NOTE — Progress Notes (Signed)
SURVIVORSHIP VISIT:    BRIEF ONCOLOGIC HISTORY:  Oncology History  Malignant neoplasm of upper-inner quadrant of left breast in female, estrogen receptor positive ([place])  05/18/2021 Initial Diagnosis    left breast upper inner quadrant biopsy 05/18/2021 for a clinical T1b N0, stage IA invasive ductal carcinoma, triple positive, with an MIB-1 of 15%   05/26/2021 Cancer Staging   Staging form: Breast, AJCC 8th Edition - Clinical stage from 05/26/2021: Stage IA (cT1b, cN0, cM0, G2, ER+, PR+, HER2+) - Signed by Chauncey Cruel, MD on 05/26/2021 Stage prefix: Initial diagnosis Histologic grading system: 3 grade system Laterality: Left Staged by: Pathologist and managing physician Stage used in treatment planning: Yes National guidelines used in treatment planning: Yes Type of national guideline used in treatment planning: NCCN   06/15/2021 Surgery   left lumpectomy 06/15/2021 for a pT1c pNX invasive ductal carcinoma grade 2, with negative margins   06/18/2021 Genetic Testing   Negative genetic testing:  No pathogenic variants detected on the [employer] CancerNext-Expanded + RNAinsight panel. Three variants of uncertain significance (VUS) were detected - one in the ATM gene called p.F1265L (c.3793T>C), a second in the LTZR1 gene called c.2414delA (U.V253GUY*4), and a third in the [place] gene called p.P58L (c.173C>T). The report date is 06/18/2021.  The CancerNext-Expanded + RNAinsight gene panel offered by [employer] and includes sequencing and rearrangement analysis for the following 77 genes: AIP, ALK, APC, ATM, AXIN2, BAP1, BARD1, BLM, BMPR1A, BRCA1, BRCA2, BRIP1, CDC73, CDH1, CDK4, CDKN1B, CDKN2A, CHEK2, CTNNA1, DICER1, FANCC, FH, FLCN, GALNT12, KIF1B, LZTR1, MAX, MEN1, MET, MLH1, MSH2, MSH3, MSH6, MUTYH, NBN, NF1, NF2, NTHL1, PALB2, PHOX2B, PMS2, POT1, PRKAR1A, PTCH1, PTEN, RAD51C, RAD51D, RB1, RECQL, RET, SDHA, SDHAF2, SDHB, SDHC, SDHD, SMAD4, SMARCA4, SMARCB1, SMARCE1, STK11, SUFU, TMEM127,  TP53, TSC1, TSC2, VHL and XRCC2 (sequencing and deletion/duplication); EGFR, EGLN1, HOXB13, KIT, MITF, PDGFRA, POLD1 and POLE (sequencing only); EPCAM and GREM1 (deletion/duplication only). RNA data is routinely analyzed for use in variant interpretation for all genes.    07/12/2021 -  Anti-estrogen oral therapy   anastrozole started 07/12/2021   07/15/2021 -  Chemotherapy   Patient is on Treatment Plan : BREAST Trastuzumab q21d       INTERVAL HISTORY:  Jillian Buchanan to review her survivorship care plan detailing her treatment course for breast cancer, as well as monitoring long-term side effects of that treatment, education regarding health maintenance, screening, and overall wellness and health promotion.     Overall, Jillian Buchanan reports feeling quite well.  Since starting Anastrozole she has experienced hot flashes, sweating, loose bm since starting anastrozole.  First thing in morning her BM is loose.  then takes a thyroid pill and then when drinking water she goes back to have another loose bm, then subsides.    The hot flashes are worse at night, and include sweating.  Occasionally wakes her up at night.  She uses a fan.    Echo on 7/30 EF 50-55%, a referral was placed to Dr. Harl Bowie on 8/1, however appoitnemnt has not yet been scheduled.    REVIEW OF SYSTEMS:  Review of Systems  Constitutional:  Positive for fatigue. Negative for appetite change, chills, fever and unexpected weight change.  HENT:   Negative for hearing loss, lump/mass and trouble swallowing.   Eyes:  Negative for eye problems and icterus.  Respiratory:  Negative for chest tightness, cough and shortness of breath.   Cardiovascular:  Negative for chest pain, leg swelling and palpitations.  Gastrointestinal:  Negative for abdominal distention,  abdominal pain, constipation, diarrhea, nausea and vomiting.  Endocrine: Positive for hot flashes.  Genitourinary:  Negative for difficulty urinating.   Musculoskeletal:  Positive for  arthralgias.  Skin:  Negative for itching and rash.  Neurological:  Negative for dizziness, extremity weakness, headaches and numbness.  Hematological:  Negative for adenopathy. Does not bruise/bleed easily.  Psychiatric/Behavioral:  Negative for depression. The patient is not nervous/anxious.     ONCOLOGY TREATMENT TEAM:  1. Surgeon:  Dr. Donne Hazel at [facility] 2. Medical Oncologist: Dr. Lindi Adie  3. Radiation Oncologist: Dr. Isidore Moos    PAST MEDICAL/SURGICAL HISTORY:  Past Medical History:  Diagnosis Date   Anxiety    self reported   Breast cancer ([place])    Cataract    Family history of bone cancer    Family history of breast cancer    Hypertension    Past Surgical History:  Procedure Laterality Date   ABDOMINAL HYSTERECTOMY     BREAST LUMPECTOMY  1988   BREAST LUMPECTOMY WITH RADIOACTIVE SEED LOCALIZATION Left 06/15/2021   Procedure: LEFT BREAST LUMPECTOMY WITH RADIOACTIVE SEED LOCALIZATION;  Surgeon: Rolm Bookbinder, MD;  Location: [facility];  Service: General;  Laterality: Left;   INTRAOCULAR PROSTHESES INSERTION  8/248/2005     ALLERGIES:  No Known Allergies   CURRENT MEDICATIONS:  Outpatient Encounter Medications as of 06/15/2022  Medication Sig   carvedilol (COREG) 3.125 MG tablet Take 1 tablet (3.125 mg total) by mouth 2 (two) times daily with a meal.   acetaminophen (TYLENOL) 325 MG tablet    ALPRAZolam (XANAX) 0.5 MG tablet TAKE 1 TABLET BY MOUTH EVERY DAY AS NEEDED FOR ANXIETY   amLODipine (NORVASC) 5 MG tablet Take 5 mg by mouth daily.     anastrozole (ARIMIDEX) 1 MG tablet Take 1 tablet (1 mg total) by mouth daily.   aspirin 81 MG EC tablet TAKE ONE (1) TABLET EACH DAY   bumetanide (BUMEX) 0.5 MG tablet Take 0.5 mg by mouth daily.     hydroquinone 4 % cream AS DIRECTED TO AFFECTED AREA TWICE A DAY AS NEEDED EXTERNALLY   levothyroxine (SYNTHROID, LEVOTHROID) 88 MCG tablet Take 88 mcg by mouth daily before breakfast.   losartan  (COZAAR) 100 MG tablet Take 100 mg by mouth daily.   naproxen sodium (ALEVE) 220 MG tablet Take 220 mg by mouth as needed.   rosuvastatin (CRESTOR) 5 MG tablet Take 5 mg by mouth daily.   triamcinolone cream (KENALOG) 0.1 % Apply 1 application  topically 4 (four) times daily.   triamcinolone cream (KENALOG) 0.5 % APPLY TO AFFECTED AREA OF LEG TWICE A DAY AS NEEDED   No facility-administered encounter medications on file as of 06/15/2022.     ONCOLOGIC FAMILY HISTORY:  Family History  Problem Relation Age of Onset   Breast cancer Maternal Aunt        x5 aunts, dx >50   Breast cancer Paternal Aunt        dx >50   Breast cancer Cousin        x4 maternal first cousins   Bone cancer Daughter 16     SOCIAL HISTORY:  Social History   Socioeconomic History   Marital status: Married    Spouse name: Not on file   Number of children: Not on file   Years of education: Not on file   Highest education level: Not on file  Occupational History   Not on file  Tobacco Use   Smoking status: Never  Smokeless tobacco: Never  Vaping Use   Vaping Use: Never used  Substance and Sexual Activity   Alcohol use: Yes    Comment: rarely, social   Drug use: No   Sexual activity: Never  Other Topics Concern   Not on file  Social History Narrative   Not on file   Social Determinants of Health   Financial Resource Strain: Not on file  Food Insecurity: No Food Insecurity (05/26/2021)   Hunger Vital Sign    Worried About Running Out of Food in the Last Year: Never true    Ran Out of Food in the Last Year: Never true  Transportation Needs: No Transportation Needs (05/26/2021)   PRAPARE - Hydrologist (Medical): No    Lack of Transportation (Non-Medical): No  Physical Activity: Not on file  Stress: Not on file  Social Connections: Not on file  Intimate Partner Violence: Not on file     OBSERVATIONS/OBJECTIVE:  BP (!) 151/85 (BP Location: Left Arm, Patient  Position: Sitting)   Pulse 72   Temp (!) 97.5 F (36.4 C) (Temporal)   Resp 16   Ht 5' 4"  (1.626 m)   Wt 213 lb 6.4 oz (96.8 kg)   SpO2 99%   BMI 36.63 kg/m  GENERAL: Patient is a well appearing female in no acute distress HEENT:  Sclerae anicteric.  Oropharynx clear and moist. No ulcerations or evidence of oropharyngeal candidiasis. Neck is supple.  NODES:  No cervical, supraclavicular, or axillary lymphadenopathy palpated.  BREAST EXAM: Right breast is benign, left breast status postlumpectomy  seroma is noted, no sign of local recurrence. LUNGS:  Clear to auscultation bilaterally.  No wheezes or rhonchi. HEART:  Regular rate and rhythm. No murmur appreciated. ABDOMEN:  Soft, nontender.  Positive, normoactive bowel sounds. No organomegaly palpated. MSK:  No focal spinal tenderness to palpation. Full range of motion bilaterally in the upper extremities. EXTREMITIES:  No peripheral edema.   SKIN:  Clear with no obvious rashes or skin changes. No nail dyscrasia. NEURO:  Nonfocal. Well oriented.  Appropriate affect.   LABORATORY DATA:  None for this visit.  DIAGNOSTIC IMAGING:  None for this visit.      ASSESSMENT AND PLAN:  Ms.. Buchanan is a pleasant 88 y.o. female with Stage 1A left breast invasive ductal carcinoma, ER+/PR+/HER2+, diagnosed in July 2022, treated with lumpectomy, maintenance trastuzumab and anti-estrogen therapy with anastrozole beginning in September 2022.  She presents to the Survivorship Clinic for our initial meeting and routine follow-up post-completion of treatment for breast cancer.    1. Stage 1A left breast cancer:  Jillian Buchanan is continuing to recover from definitive treatment for breast cancer. She will follow-up with her medical oncologist, Dr. Lindi Adie in 3 months with history and physical exam per surveillance protocol.  She will continue her anti-estrogen therapy with anastrozole. Thus far, she is tolerating the anastrozole well, with tolerable side effects  including hot flashes.  The loose bowels will likely improve once she finishes Herceptin.Marland Kitchen She was instructed to make Dr. Lindi Adie or myself aware if she begins to experience any worsening side effects of the medication and I could see her back in clinic to help manage those side effects, as needed. Her mammogram is due 05/2023. Today, a comprehensive survivorship care plan and treatment summary was reviewed with the patient today detailing her breast cancer diagnosis, treatment course, potential late/long-term effects of treatment, appropriate follow-up care with recommendations for the future, and patient education resources.  A copy of this summary, along with a letter will be sent to the patient's primary care provider via mail/fax/In Basket message after today's visit.    2.  Slight decrease in ejection fraction: I spoke with Dr. Harl Bowie who reviewed her echocardiogram today.  She recommends that Jillian Buchanan begin carvedilol twice daily to decrease her afterload and she will see her in clinic.  I will have my nurse call to get this scheduled.  Dr. Harl Bowie inform me that Jillian Buchanan was okay to proceed with Herceptin.  3. Bone health:  Given Jillian Buchanan's age/history of breast cancer and her current treatment regimen including anti-estrogen therapy with Anastrozole, she is at risk for bone demineralization.  Her last DEXA scan was 04/27/2021, and was normal. I recommended that we repeat this every 2 years while she is taking Anastrozole.  In the meantime, she was encouraged to increase her consumption of foods rich in calcium, as well as increase her weight-bearing activities.  She was given education on specific activities to promote bone health.  4. Cancer screening:  Due to Jillian Buchanan's history and her age, she should receive screening for skin cancers.  The information and recommendations are listed on the patient's comprehensive care plan/treatment summary and were reviewed in detail with the patient.    5. Health  maintenance and wellness promotion: Jillian Buchanan was encouraged to consume 5-7 servings of fruits and vegetables per day. We reviewed the "Nutrition Rainbow" handout.  She was also encouraged to engage in moderate to vigorous exercise for 30 minutes per day most days of the week. We discussed the [employer] fitness program, which is designed for cancer survivors to help them become more physically fit after cancer treatments.  She was instructed to limit her alcohol consumption and continue to abstain from tobacco use.     6. Support services/counseling: It is not uncommon for this period of the patient's cancer care trajectory to be one of many emotions and stressors.  She was given information regarding our available services and encouraged to contact me with any questions or for help enrolling in any of our support group/programs.    Follow up instructions:    -Return to cancer center in 3 months for f/u with Dr. Lindi Adie  -Mammogram due in 05/2023 -Bone density due 04/2023 -Follow up with Dr. Donne Hazel -She is welcome to return back to the Survivorship Clinic at any time; no additional follow-up needed at this time.  -Consider referral back to survivorship as a long-term survivor for continued surveillance  The patient was provided an opportunity to ask questions and all were answered. The patient agreed with the plan and demonstrated an understanding of the instructions.   Total encounter time:45 minutes*in face-to-face visit time, chart review, lab review, care coordination, order entry, and documentation of the encounter time.    [name], NP 06/15/22 1:31 PM Medical Oncology and Hematology [facility] [place], [place] [address] Tel. [phone]    Fax. [phone]  *Total Encounter Time as defined by the Centers for Medicare and Medicaid Services includes, in addition to the face-to-face time of a patient visit (documented in the note above)  non-face-to-face time: obtaining and reviewing outside history, ordering and reviewing medications, tests or procedures, care coordination (communications with other health care professionals or caregivers) and documentation in the medical record.

## 2022-06-16 ENCOUNTER — Telehealth: Payer: Self-pay | Admitting: Adult Health

## 2022-06-16 ENCOUNTER — Other Ambulatory Visit: Payer: Self-pay | Admitting: Hematology and Oncology

## 2022-06-16 NOTE — Telephone Encounter (Signed)
Scheduled appointment per 8/16 los. Patient is aware. 

## 2022-06-17 ENCOUNTER — Other Ambulatory Visit: Payer: Self-pay

## 2022-07-06 ENCOUNTER — Inpatient Hospital Stay: Payer: Medicare PPO | Attending: Oncology

## 2022-07-06 ENCOUNTER — Other Ambulatory Visit: Payer: Self-pay

## 2022-07-06 ENCOUNTER — Encounter: Payer: Self-pay | Admitting: *Deleted

## 2022-07-06 VITALS — BP 143/76 | HR 70 | Temp 98.9°F | Resp 18 | Wt 216.4 lb

## 2022-07-06 DIAGNOSIS — C50212 Malignant neoplasm of upper-inner quadrant of left female breast: Secondary | ICD-10-CM

## 2022-07-06 DIAGNOSIS — Z5112 Encounter for antineoplastic immunotherapy: Secondary | ICD-10-CM | POA: Insufficient documentation

## 2022-07-06 DIAGNOSIS — Z170 Estrogen receptor positive status [ER+]: Secondary | ICD-10-CM | POA: Diagnosis not present

## 2022-07-06 MED ORDER — TRASTUZUMAB-HYALURONIDASE-OYSK 600-10000 MG-UNT/5ML ~~LOC~~ SOLN
600.0000 mg | Freq: Once | SUBCUTANEOUS | Status: AC
  Administered 2022-07-06: 600 mg via SUBCUTANEOUS
  Filled 2022-07-06: qty 5

## 2022-07-06 MED ORDER — ACETAMINOPHEN 325 MG PO TABS
650.0000 mg | ORAL_TABLET | Freq: Once | ORAL | Status: AC
  Administered 2022-07-06: 650 mg via ORAL
  Filled 2022-07-06: qty 2

## 2022-07-06 NOTE — Patient Instructions (Signed)
[facility]  Discharge Instructions: Thank you for choosing [facility] to provide your oncology and hematology care.   If you have a lab appointment with the [facility], please go directly to the [facility] and check in at the registration area.   Wear comfortable clothing and clothing appropriate for easy access to any Portacath or PICC line.   We strive to give you quality time with your provider. You may need to reschedule your appointment if you arrive late (15 or more minutes).  Arriving late affects you and other patients whose appointments are after yours.  Also, if you miss three or more appointments without notifying the office, you may be dismissed from the clinic at the provider's discretion.      For prescription refill requests, have your pharmacy contact our office and allow 72 hours for refills to be completed.    Today you received the following chemotherapy and/or immunotherapy agents: Herceptin Hylecta      To help prevent nausea and vomiting after your treatment, we encourage you to take your nausea medication as directed.  BELOW ARE SYMPTOMS THAT SHOULD BE REPORTED IMMEDIATELY: *FEVER GREATER THAN 100.4 F (38 C) OR HIGHER *CHILLS OR SWEATING *NAUSEA AND VOMITING THAT IS NOT CONTROLLED WITH YOUR NAUSEA MEDICATION *UNUSUAL SHORTNESS OF BREATH *UNUSUAL BRUISING OR BLEEDING *URINARY PROBLEMS (pain or burning when urinating, or frequent urination) *BOWEL PROBLEMS (unusual diarrhea, constipation, pain near the anus) TENDERNESS IN MOUTH AND THROAT WITH OR WITHOUT PRESENCE OF ULCERS (sore throat, sores in mouth, or a toothache) UNUSUAL RASH, SWELLING OR PAIN  UNUSUAL VAGINAL DISCHARGE OR ITCHING   Items with * indicate a potential emergency and should be followed up as soon as possible or go to the Emergency Department if any problems should occur.  Please show the CHEMOTHERAPY ALERT CARD or IMMUNOTHERAPY ALERT CARD at  check-in to the Emergency Department and triage nurse.  Should you have questions after your visit or need to cancel or reschedule your appointment, please contact [facility]  Dept: [phone]  and follow the prompts.  Office hours are 8:00 a.m. to 4:30 p.m. Monday - Friday. Please note that voicemails left after 4:00 p.m. may not be returned until the following business day.  We are closed weekends and major holidays. You have access to a nurse at all times for urgent questions. Please call the main number to the clinic Dept: [phone] and follow the prompts.   For any non-urgent questions, you may also contact your provider using MyChart. We now offer e-Visits for anyone 79 and older to request care online for non-urgent symptoms. For details visit [email or web address].   Also download the MyChart app! Go to the app store, search "MyChart", open the app, select [facility], and log in with your MyChart username and password.  Masks are optional in the cancer centers. If you would like for your care team to wear a mask while they are taking care of you, please let them know. For doctor visits, patients may have with them one support person who is at least 86 years old. At this time, visitors are not allowed in the infusion area.

## 2022-07-07 ENCOUNTER — Other Ambulatory Visit: Payer: Self-pay | Admitting: Adult Health

## 2022-07-11 ENCOUNTER — Ambulatory Visit: Payer: Medicare PPO | Attending: Internal Medicine | Admitting: Internal Medicine

## 2022-07-11 ENCOUNTER — Encounter: Payer: Self-pay | Admitting: Hematology and Oncology

## 2022-07-11 ENCOUNTER — Encounter: Payer: Self-pay | Admitting: Internal Medicine

## 2022-07-11 VITALS — BP 128/76 | HR 70 | Ht 64.0 in | Wt 214.0 lb

## 2022-07-11 DIAGNOSIS — I427 Cardiomyopathy due to drug and external agent: Secondary | ICD-10-CM | POA: Diagnosis not present

## 2022-07-11 MED ORDER — CARVEDILOL 3.125 MG PO TABS: ORAL_TABLET | ORAL | 3 refills | Status: DC

## 2022-07-11 NOTE — Progress Notes (Signed)
Cardiology Office Note:    Date:  07/11/2022   ID:  Jillian Buchanan, DOB Aug 15, 1933, MRN [id number]  PCP:  Seward Carol, MD   [facility] Providers Cardiologist:  None     Referring MD: Seward Carol, MD   No chief complaint on file. Cardiotoxicity  History of Present Illness:    Jillian Buchanan is a 88 y.o. female with a hx of L BC Er/PR/HER2+, s/p L lumpectomy 06/15/2021,   s/p completion of herceptin therapy, on anastrozole referral from [facility] for low GLS  She has had surveillance echoes and most recent EF looked closer to 50%, GLS was not interpretable, Her RV is normal. No pulmonary HTN. She has had low global longitudinal strain She has no significant valve disease. I spoke with [name] and recommended starting low dose carvedilol.  In terms of her cancer therapy, breast cancer 1988,  L breast, s/p radiation.  She was s/p lumpectomy. She took tamoxifen for 5 years. No chemotherapy. She had recurrence 05/18/2021. She is s/p lumpectomy 06/15/2021. She underwent herceptin therapy and has completed 12 months.  She notes low energy. She does water aerobics. She has felt exhausted for a while. She worries may be related to deconditioning.  She denies SOB or chest pressure. No orthopnea or PND. She notes swelling in her legs; R>L. She notes , she takes bumex for HTN. No hx of HFpEF. No cardiac w/u beyond echoes. No hx of CAD  Family Hx: Mother had heart dx. Father had a stroke  Social Hx: no cigarette smoking  Echoes: 06/07/2021 LVEF 55%, GLS -14%,  RV function is normal. Grade 1 DD 10/26/2021 LVEF 55-60%, GLS -16%, grade II DD 03/07/2022:  EF 55%, GLS -16%,  05/30/2022  EF 505  No pulmonary HTN  Past Medical History:  Diagnosis Date   Anxiety    self reported   Breast cancer ([facility])    Cataract    Family history of bone cancer    Family history of breast cancer    Hypertension     Past Surgical History:  Procedure Laterality Date   ABDOMINAL  HYSTERECTOMY     BREAST LUMPECTOMY  1988   BREAST LUMPECTOMY WITH RADIOACTIVE SEED LOCALIZATION Left 06/15/2021   Procedure: LEFT BREAST LUMPECTOMY WITH RADIOACTIVE SEED LOCALIZATION;  Surgeon: Rolm Bookbinder, MD;  Location: [facility];  Service: General;  Laterality: Left;   INTRAOCULAR PROSTHESES INSERTION  8/248/2005    Current Medications: Current Meds  Medication Sig   acetaminophen (TYLENOL) 325 MG tablet    ALPRAZolam (XANAX) 0.5 MG tablet TAKE 1 TABLET BY MOUTH EVERY DAY AS NEEDED FOR ANXIETY   amLODipine (NORVASC) 5 MG tablet Take 5 mg by mouth daily.     anastrozole (ARIMIDEX) 1 MG tablet Take 1 tablet (1 mg total) by mouth daily.   aspirin 81 MG EC tablet TAKE ONE (1) TABLET EACH DAY   bumetanide (BUMEX) 0.5 MG tablet Take 0.5 mg by mouth daily.     hydroquinone 4 % cream AS DIRECTED TO AFFECTED AREA TWICE A DAY AS NEEDED EXTERNALLY   levothyroxine (SYNTHROID, LEVOTHROID) 88 MCG tablet Take 88 mcg by mouth daily before breakfast.   losartan (COZAAR) 100 MG tablet Take 100 mg by mouth daily.   naproxen sodium (ALEVE) 220 MG tablet Take 220 mg by mouth as needed.   rosuvastatin (CRESTOR) 5 MG tablet Take 5 mg by mouth daily.   triamcinolone cream (KENALOG) 0.5 % APPLY TO AFFECTED AREA OF  LEG TWICE A DAY AS NEEDED   [DISCONTINUED] carvedilol (COREG) 3.125 MG tablet TAKE 1 TABLET BY MOUTH TWICE A DAY WITH A MEAL     Allergies:   Patient has no known allergies.   Social History   Socioeconomic History   Marital status: Married    Spouse name: Not on file   Number of children: Not on file   Years of education: Not on file   Highest education level: Not on file  Occupational History   Not on file  Tobacco Use   Smoking status: Never   Smokeless tobacco: Never  Vaping Use   Vaping Use: Never used  Substance and Sexual Activity   Alcohol use: Yes    Comment: rarely, social   Drug use: No   Sexual activity: Never  Other Topics Concern   Not on file   Social History Narrative   Not on file   Social Determinants of Health   Financial Resource Strain: Not on file  Food Insecurity: No Food Insecurity (05/26/2021)   Hunger Vital Sign    Worried About Running Out of Food in the Last Year: Never true    Ran Out of Food in the Last Year: Never true  Transportation Needs: No Transportation Needs (05/26/2021)   PRAPARE - Hydrologist (Medical): No    Lack of Transportation (Non-Medical): No  Physical Activity: Not on file  Stress: Not on file  Social Connections: Not on file     Family History: The patient's family history includes Bone cancer (age of onset: 37) in her daughter; Breast cancer in her cousin, maternal aunt, and paternal aunt.  ROS:   Please see the history of present illness.     All other systems reviewed and are negative.  EKGs/Labs/Other Studies Reviewed:    The following studies were reviewed today:   EKG:  EKG is  ordered today.  The ekg ordered today demonstrates   9/11- NSR, 1st degree AV block 240 ms,poor R wave progression/anterior Q waves (unchanged from August 2022)  Recent Labs: 06/15/2022: ALT 13; BUN 23; Creatinine 0.91; Hemoglobin 11.8; Platelet Count 211; Potassium 3.9; Sodium 138   Recent Lipid Panel No results found for: "CHOL", "TRIG", "HDL", "CHOLHDL", "VLDL", "LDLCALC", "LDLDIRECT"   Risk Assessment/Calculations:     Physical Exam:    VS:  BP 128/76   Pulse 70   Ht 5' 4"  (1.626 m)   Wt 214 lb (97.1 kg)   SpO2 98%   BMI 36.73 kg/m     Wt Readings from Last 3 Encounters:  07/11/22 214 lb (97.1 kg)  07/06/22 216 lb 6.4 oz (98.2 kg)  06/15/22 213 lb 6.4 oz (96.8 kg)     GEN:  Well nourished, well developed in no acute distress HEENT: Normal NECK: No JVD; No carotid bruits LYMPHATICS: No lymphadenopathy CARDIAC: RRR, no murmurs, rubs, gallops RESPIRATORY:  Clear to auscultation without rales, wheezing or rhonchi  ABDOMEN: Soft, non-tender,  non-distended MUSCULOSKELETAL:  BL LE edema R>L, no pitting SKIN: Warm and dry NEUROLOGIC:  Alert and oriented x 3 PSYCHIATRIC:  Normal affect   ASSESSMENT:   Cardio-Onc: She is s/p her final dose of herceptin after 1 year of therapy. She notes some leg swelling but this is chronic. She denies CHF symptoms. Her strain has been low. Recently started her on carvediolol 3.125 mg BID for cardioprotection. She is tolerating well. She is euvolemic and asymptomatic. Will plan for FU limited TTE in 3  months after cessation. She has no signs of radiation induced fibrotic disease. She has no signs of XRT induced coronary dx.  HTN: continue carvedilol 3.125 mg BID and losartan 100 mg daily.  She has normal renal function.  Lymphedema: trial compression stockings, can continue bumex.   PLAN:    In order of problems listed above:  BNP Limted TTE/GLS 09/2022 Follow up 6 months Compression stockings Refill carvediolol         Medication Adjustments/Labs and Tests Ordered: Current medicines are reviewed at length with the patient today.  Concerns regarding medicines are outlined above.  Orders Placed This Encounter  Procedures   Brain natriuretic peptide   EKG 12-Lead   ECHOCARDIOGRAM LIMITED   Meds ordered this encounter  Medications   carvedilol (COREG) 3.125 MG tablet    Sig: TAKE 1 TABLET BY MOUTH TWICE A DAY WITH A MEAL    Dispense:  180 tablet    Refill:  3    Patient Instructions  Medication Instructions:  Your physician recommends that you continue on your current medications as directed. Please refer to the Current Medication list given to you today.  *If you need a refill on your cardiac medications before your next appointment, please call your pharmacy*   Lab Work: Your physician recommends that you have labs drawn today: BNP  If you have labs (blood work) drawn today and your tests are completely normal, you will receive your results only by: [place] (if you  have MyChart) OR A paper copy in the mail If you have any lab test that is abnormal or we need to change your treatment, we will call you to review the results.   Testing/Procedures: Your physician has requested that you have a limited echocardiogram. Echocardiography is a painless test that uses sound waves to create images of your heart. It provides your doctor with information about the size and shape of your heart and how well your heart's chambers and valves are working. This procedure takes approximately one hour. There are no restrictions for this procedure. To be done in December. This procedure will be done at [address]    Follow-Up: At [facility], you and your health needs are our priority.  As part of our continuing mission to provide you with exceptional heart care, we have created designated Provider Care Teams.  These Care Teams include your primary Cardiologist (physician) and Advanced Practice Providers (APPs -  Physician Assistants and Nurse Practitioners) who all work together to provide you with the care you need, when you need it.  We recommend signing up for the patient portal called "MyChart".  Sign up information is provided on this After Visit Summary.  MyChart is used to connect with patients for Virtual Visits (Telemedicine).  Patients are able to view lab/test results, encounter notes, upcoming appointments, etc.  Non-urgent messages can be sent to your provider as well.   To learn more about what you can do with MyChart, go to [email or web address].    Your next appointment:   6 month(s)  The format for your next appointment:   In Person  Provider:   Phineas Inches, MD   Other Instructions Dr. Harl Bowie recommends compression stockings at compression level 15-23mHg. You can get these at any medical supply store or [facility]   Signed, BJanina Mayo MD  07/11/2022 4:43 PM    [place]

## 2022-07-11 NOTE — Patient Instructions (Signed)
Medication Instructions:  Your physician recommends that you continue on your current medications as directed. Please refer to the Current Medication list given to you today.  *If you need a refill on your cardiac medications before your next appointment, please call your pharmacy*   Lab Work: Your physician recommends that you have labs drawn today: BNP  If you have labs (blood work) drawn today and your tests are completely normal, you will receive your results only by: [place] (if you have MyChart) OR A paper copy in the mail If you have any lab test that is abnormal or we need to change your treatment, we will call you to review the results.   Testing/Procedures: Your physician has requested that you have a limited echocardiogram. Echocardiography is a painless test that uses sound waves to create images of your heart. It provides your doctor with information about the size and shape of your heart and how well your heart's chambers and valves are working. This procedure takes approximately one hour. There are no restrictions for this procedure. To be done in December. This procedure will be done at [address]    Follow-Up: At [facility], you and your health needs are our priority.  As part of our continuing mission to provide you with exceptional heart care, we have created designated Provider Care Teams.  These Care Teams include your primary Cardiologist (physician) and Advanced Practice Providers (APPs -  Physician Assistants and Nurse Practitioners) who all work together to provide you with the care you need, when you need it.  We recommend signing up for the patient portal called "MyChart".  Sign up information is provided on this After Visit Summary.  MyChart is used to connect with patients for Virtual Visits (Telemedicine).  Patients are able to view lab/test results, encounter notes, upcoming appointments, etc.  Non-urgent messages can be sent to  your provider as well.   To learn more about what you can do with MyChart, go to [email or web address].    Your next appointment:   6 month(s)  The format for your next appointment:   In Person  Provider:   Phineas Inches, MD   Other Instructions Dr. Harl Bowie recommends compression stockings at compression level 15-48mHg. You can get these at any medical supply store or [facility]

## 2022-07-12 LAB — BRAIN NATRIURETIC PEPTIDE: BNP: 110.3 pg/mL — ABNORMAL HIGH (ref 0.0–100.0)

## 2022-07-20 ENCOUNTER — Other Ambulatory Visit: Payer: Self-pay | Admitting: Oncology

## 2022-08-18 ENCOUNTER — Other Ambulatory Visit: Payer: Self-pay

## 2022-09-11 NOTE — Progress Notes (Signed)
Patient Care Team: Seward Carol, MD as PCP - General (Internal Medicine) Rolm Bookbinder, MD as Consulting Physician (General Surgery) Eppie Gibson, MD as Attending Physician (Radiation Oncology) Garald Balding, MD as Consulting Physician (Orthopedic Surgery) Shon Hough, MD as Consulting Physician (Ophthalmology) Nicholas Lose, MD as Consulting Physician (Hematology and Oncology)  DIAGNOSIS: No diagnosis found.  SUMMARY OF ONCOLOGIC HISTORY: Oncology History  Malignant neoplasm of upper-inner quadrant of left breast in female, estrogen receptor positive ([facility])  05/18/2021 Initial Diagnosis    left breast upper inner quadrant biopsy 05/18/2021 for a clinical T1b N0, stage IA invasive ductal carcinoma, triple positive, with an MIB-1 of 15%   05/26/2021 Cancer Staging   Staging form: Breast, AJCC 8th Edition - Clinical stage from 05/26/2021: Stage IA (cT1b, cN0, cM0, G2, ER+, PR+, HER2+) - Signed by Chauncey Cruel, MD on 05/26/2021 Stage prefix: Initial diagnosis Histologic grading system: 3 grade system Laterality: Left Staged by: Pathologist and managing physician Stage used in treatment planning: Yes National guidelines used in treatment planning: Yes Type of national guideline used in treatment planning: NCCN   06/15/2021 Surgery   left lumpectomy 06/15/2021 for a pT1c pNX invasive ductal carcinoma grade 2, with negative margins   06/18/2021 Genetic Testing   Negative genetic testing:  No pathogenic variants detected on the [employer] CancerNext-Expanded + RNAinsight panel. Three variants of uncertain significance (VUS) were detected - one in the ATM gene called p.F1265L (c.3793T>C), a second in the LTZR1 gene called c.2414delA (H.F026VZC*5), and a third in the Egan gene called p.P58L (c.173C>T). The report date is 06/18/2021.  The CancerNext-Expanded + RNAinsight gene panel offered by [employer] and includes sequencing and rearrangement analysis for the following  77 genes: AIP, ALK, APC, ATM, AXIN2, BAP1, BARD1, BLM, BMPR1A, BRCA1, BRCA2, BRIP1, CDC73, CDH1, CDK4, CDKN1B, CDKN2A, CHEK2, CTNNA1, DICER1, FANCC, FH, FLCN, GALNT12, KIF1B, LZTR1, MAX, MEN1, MET, MLH1, MSH2, MSH3, MSH6, MUTYH, NBN, NF1, NF2, NTHL1, PALB2, PHOX2B, PMS2, POT1, PRKAR1A, PTCH1, PTEN, RAD51C, RAD51D, RB1, RECQL, RET, SDHA, SDHAF2, SDHB, SDHC, SDHD, SMAD4, SMARCA4, SMARCB1, SMARCE1, STK11, SUFU, TMEM127, TP53, TSC1, TSC2, VHL and XRCC2 (sequencing and deletion/duplication); EGFR, EGLN1, HOXB13, KIT, MITF, PDGFRA, POLD1 and POLE (sequencing only); EPCAM and GREM1 (deletion/duplication only). RNA data is routinely analyzed for use in variant interpretation for all genes.    07/12/2021 -  Anti-estrogen oral therapy   anastrozole started 07/12/2021   07/15/2021 -  Chemotherapy   Patient is on Treatment Plan : BREAST Trastuzumab q21d       CHIEF COMPLIANT:   INTERVAL HISTORY: PEGGY LOGE is a   ALLERGIES:  has No Known Allergies.  MEDICATIONS:  Current Outpatient Medications  Medication Sig Dispense Refill   acetaminophen (TYLENOL) 325 MG tablet      ALPRAZolam (XANAX) 0.5 MG tablet TAKE 1 TABLET BY MOUTH EVERY DAY AS NEEDED FOR ANXIETY  3   amLODipine (NORVASC) 5 MG tablet Take 5 mg by mouth daily.       anastrozole (ARIMIDEX) 1 MG tablet Take 1 tablet (1 mg total) by mouth daily. 90 tablet 3   aspirin 81 MG EC tablet TAKE ONE (1) TABLET EACH DAY  3   bumetanide (BUMEX) 0.5 MG tablet Take 0.5 mg by mouth daily.       carvedilol (COREG) 3.125 MG tablet TAKE 1 TABLET BY MOUTH TWICE A DAY WITH A MEAL 180 tablet 3   hydroquinone 4 % cream AS DIRECTED TO AFFECTED AREA TWICE A DAY AS NEEDED EXTERNALLY  levothyroxine (SYNTHROID, LEVOTHROID) 88 MCG tablet Take 88 mcg by mouth daily before breakfast.     losartan (COZAAR) 100 MG tablet Take 100 mg by mouth daily.     naproxen sodium (ALEVE) 220 MG tablet Take 220 mg by mouth as needed.     rosuvastatin (CRESTOR) 5 MG tablet Take 5 mg  by mouth daily.     triamcinolone cream (KENALOG) 0.5 % APPLY TO AFFECTED AREA OF LEG TWICE A DAY AS NEEDED  3   No current facility-administered medications for this visit.    PHYSICAL EXAMINATION: ECOG PERFORMANCE STATUS: {CHL ONC ECOG PS:[id number]}  There were no vitals filed for this visit. There were no vitals filed for this visit.  BREAST:*** No palpable masses or nodules in either right or left breasts. No palpable axillary supraclavicular or infraclavicular adenopathy no breast tenderness or nipple discharge. (exam performed in the presence of a chaperone)  LABORATORY DATA:  I have reviewed the data as listed    Latest Ref Rng & Units 06/15/2022   10:14 AM 04/12/2022    1:25 PM 02/09/2022    1:34 PM  CMP  Glucose 70 - 99 mg/dL 99  103  106   BUN 8 - 23 mg/dL _0 Creatinine 0.44 - 1.00 mg/dL 0.91  0.88  0.91   Sodium 135 - 145 mmol/L 138  140  138   Potassium 3.5 - 5.1 mmol/L 3.9  3.7  3.5   Chloride 98 - 111 mmol/L 107  106  105   CO2 22 - 32 mmol/L _1 Calcium 8.9 - 10.3 mg/dL 9.7  9.9  9.6   Total Protein 6.5 - 8.1 g/dL 7.8  7.8  7.6   Total Bilirubin 0.3 - 1.2 mg/dL 0.5  0.4  0.6   Alkaline Phos 38 - 126 U/L 72  64  65   AST 15 - 41 U/L _2 ALT 0 - 44 U/L _3 Lab Results  Component Value Date   WBC 3.9 (L) 06/15/2022   HGB 11.8 (L) 06/15/2022   HCT 35.3 (L) 06/15/2022   MCV 86.7 06/15/2022   PLT 211 06/15/2022   NEUTROABS 1.9 06/15/2022    ASSESSMENT & PLAN:  No problem-specific Assessment & Plan notes found for this encounter.    No orders of the defined types were placed in this encounter.  The patient has a good understanding of the overall plan. she agrees with it. she will call with any problems that may develop before the next visit here. Total time spent: 30 mins including face to face time and time spent for planning, charting and co-ordination of care   Suzzette Righter, [place] 09/11/22    I Gardiner Coins am scribing for Dr. Lindi Adie  ***

## 2022-09-15 ENCOUNTER — Inpatient Hospital Stay: Payer: Medicare PPO | Attending: Oncology | Admitting: Hematology and Oncology

## 2022-09-15 VITALS — BP 147/75 | HR 76 | Temp 97.5°F | Resp 18 | Ht 64.0 in | Wt 214.0 lb

## 2022-09-15 DIAGNOSIS — C50212 Malignant neoplasm of upper-inner quadrant of left female breast: Secondary | ICD-10-CM | POA: Diagnosis present

## 2022-09-15 DIAGNOSIS — R232 Flushing: Secondary | ICD-10-CM | POA: Diagnosis not present

## 2022-09-15 DIAGNOSIS — Z170 Estrogen receptor positive status [ER+]: Secondary | ICD-10-CM

## 2022-09-15 DIAGNOSIS — Z79811 Long term (current) use of aromatase inhibitors: Secondary | ICD-10-CM | POA: Insufficient documentation

## 2022-09-15 DIAGNOSIS — Z9221 Personal history of antineoplastic chemotherapy: Secondary | ICD-10-CM | POA: Insufficient documentation

## 2022-09-15 DIAGNOSIS — Z79899 Other long term (current) drug therapy: Secondary | ICD-10-CM | POA: Insufficient documentation

## 2022-09-15 DIAGNOSIS — Z79890 Hormone replacement therapy (postmenopausal): Secondary | ICD-10-CM | POA: Insufficient documentation

## 2022-09-15 MED ORDER — ANASTROZOLE 1 MG PO TABS: 1.0000 mg | ORAL_TABLET | Freq: Every day | ORAL | 3 refills | Status: DC

## 2022-09-15 NOTE — Assessment & Plan Note (Addendum)
left lumpectomy 06/15/2021 for a pT1c pNX invasive ductal carcinoma grade 2 size: 1.4 cm with intermediate grade DCIS, with negative margins, ER 100%, PR 2%, HER2 positive, Ki-67 15% Adjuvant Herceptin completed 07/06/2022  Current treatment:  anastrozole started 07/12/2021 Anastrozole toxicities:  Intermittent fatigue and hot flashes  Joint stiffness and achiness which gets better with activity patient uses a cane to get around and has knee brace.  Breast cancer surveillance: Breast exam 09/15/2022: Benign Mammogram 05/09/2022 at [facility]: Benign breast density category B  Return to clinic in 1 year for follow-up

## 2022-09-17 ENCOUNTER — Other Ambulatory Visit: Payer: Self-pay

## 2022-10-10 ENCOUNTER — Ambulatory Visit (HOSPITAL_COMMUNITY): Payer: Medicare PPO | Attending: Cardiovascular Disease

## 2022-10-10 DIAGNOSIS — I427 Cardiomyopathy due to drug and external agent: Secondary | ICD-10-CM | POA: Diagnosis present

## 2022-10-10 LAB — ECHOCARDIOGRAM LIMITED
Area-P 1/2: 2.3 cm2
S' Lateral: 2.8 cm

## 2022-11-08 ENCOUNTER — Encounter: Payer: Self-pay | Admitting: *Deleted

## 2022-11-09 ENCOUNTER — Ambulatory Visit
Admission: RE | Admit: 2022-11-09 | Discharge: 2022-11-09 | Disposition: A | Discharge status: Home / Self Care | Payer: Medicare PPO | Source: Ambulatory Visit | Attending: Physician Assistant | Admitting: Physician Assistant

## 2022-11-09 ENCOUNTER — Other Ambulatory Visit: Payer: Self-pay | Admitting: Physician Assistant

## 2022-11-09 DIAGNOSIS — R058 Other specified cough: Secondary | ICD-10-CM

## 2023-02-01 DIAGNOSIS — M5416 Radiculopathy, lumbar region: Secondary | ICD-10-CM | POA: Diagnosis not present

## 2023-02-01 DIAGNOSIS — M25561 Pain in right knee: Secondary | ICD-10-CM | POA: Diagnosis not present

## 2023-02-01 DIAGNOSIS — R260 Ataxic gait: Secondary | ICD-10-CM | POA: Diagnosis not present

## 2023-02-01 DIAGNOSIS — M25562 Pain in left knee: Secondary | ICD-10-CM | POA: Diagnosis not present

## 2023-02-06 DIAGNOSIS — M25562 Pain in left knee: Secondary | ICD-10-CM | POA: Diagnosis not present

## 2023-02-06 DIAGNOSIS — R260 Ataxic gait: Secondary | ICD-10-CM | POA: Diagnosis not present

## 2023-02-06 DIAGNOSIS — M25561 Pain in right knee: Secondary | ICD-10-CM | POA: Diagnosis not present

## 2023-02-06 DIAGNOSIS — M5416 Radiculopathy, lumbar region: Secondary | ICD-10-CM | POA: Diagnosis not present

## 2023-02-15 DIAGNOSIS — M25561 Pain in right knee: Secondary | ICD-10-CM | POA: Diagnosis not present

## 2023-02-15 DIAGNOSIS — M25562 Pain in left knee: Secondary | ICD-10-CM | POA: Diagnosis not present

## 2023-02-15 DIAGNOSIS — M5416 Radiculopathy, lumbar region: Secondary | ICD-10-CM | POA: Diagnosis not present

## 2023-02-15 DIAGNOSIS — R260 Ataxic gait: Secondary | ICD-10-CM | POA: Diagnosis not present

## 2023-02-20 DIAGNOSIS — R260 Ataxic gait: Secondary | ICD-10-CM | POA: Diagnosis not present

## 2023-02-20 DIAGNOSIS — M25561 Pain in right knee: Secondary | ICD-10-CM | POA: Diagnosis not present

## 2023-02-20 DIAGNOSIS — M5416 Radiculopathy, lumbar region: Secondary | ICD-10-CM | POA: Diagnosis not present

## 2023-02-20 DIAGNOSIS — M25562 Pain in left knee: Secondary | ICD-10-CM | POA: Diagnosis not present

## 2023-02-22 DIAGNOSIS — M25562 Pain in left knee: Secondary | ICD-10-CM | POA: Diagnosis not present

## 2023-02-22 DIAGNOSIS — R260 Ataxic gait: Secondary | ICD-10-CM | POA: Diagnosis not present

## 2023-02-22 DIAGNOSIS — M5416 Radiculopathy, lumbar region: Secondary | ICD-10-CM | POA: Diagnosis not present

## 2023-02-22 DIAGNOSIS — M25561 Pain in right knee: Secondary | ICD-10-CM | POA: Diagnosis not present

## 2023-02-27 DIAGNOSIS — R260 Ataxic gait: Secondary | ICD-10-CM | POA: Diagnosis not present

## 2023-02-27 DIAGNOSIS — M25562 Pain in left knee: Secondary | ICD-10-CM | POA: Diagnosis not present

## 2023-02-27 DIAGNOSIS — M25561 Pain in right knee: Secondary | ICD-10-CM | POA: Diagnosis not present

## 2023-02-27 DIAGNOSIS — M5416 Radiculopathy, lumbar region: Secondary | ICD-10-CM | POA: Diagnosis not present

## 2023-03-01 DIAGNOSIS — M5416 Radiculopathy, lumbar region: Secondary | ICD-10-CM | POA: Diagnosis not present

## 2023-03-01 DIAGNOSIS — R260 Ataxic gait: Secondary | ICD-10-CM | POA: Diagnosis not present

## 2023-03-01 DIAGNOSIS — M25562 Pain in left knee: Secondary | ICD-10-CM | POA: Diagnosis not present

## 2023-03-01 DIAGNOSIS — M25561 Pain in right knee: Secondary | ICD-10-CM | POA: Diagnosis not present

## 2023-03-06 DIAGNOSIS — R260 Ataxic gait: Secondary | ICD-10-CM | POA: Diagnosis not present

## 2023-03-06 DIAGNOSIS — M25562 Pain in left knee: Secondary | ICD-10-CM | POA: Diagnosis not present

## 2023-03-06 DIAGNOSIS — M25561 Pain in right knee: Secondary | ICD-10-CM | POA: Diagnosis not present

## 2023-03-06 DIAGNOSIS — M5416 Radiculopathy, lumbar region: Secondary | ICD-10-CM | POA: Diagnosis not present

## 2023-03-08 DIAGNOSIS — M25562 Pain in left knee: Secondary | ICD-10-CM | POA: Diagnosis not present

## 2023-03-08 DIAGNOSIS — M25561 Pain in right knee: Secondary | ICD-10-CM | POA: Diagnosis not present

## 2023-03-08 DIAGNOSIS — R260 Ataxic gait: Secondary | ICD-10-CM | POA: Diagnosis not present

## 2023-03-08 DIAGNOSIS — M5416 Radiculopathy, lumbar region: Secondary | ICD-10-CM | POA: Diagnosis not present

## 2023-03-13 DIAGNOSIS — M25562 Pain in left knee: Secondary | ICD-10-CM | POA: Diagnosis not present

## 2023-03-13 DIAGNOSIS — M5416 Radiculopathy, lumbar region: Secondary | ICD-10-CM | POA: Diagnosis not present

## 2023-03-13 DIAGNOSIS — R260 Ataxic gait: Secondary | ICD-10-CM | POA: Diagnosis not present

## 2023-03-13 DIAGNOSIS — M25561 Pain in right knee: Secondary | ICD-10-CM | POA: Diagnosis not present

## 2023-03-15 DIAGNOSIS — M25562 Pain in left knee: Secondary | ICD-10-CM | POA: Diagnosis not present

## 2023-03-15 DIAGNOSIS — M5416 Radiculopathy, lumbar region: Secondary | ICD-10-CM | POA: Diagnosis not present

## 2023-03-15 DIAGNOSIS — R260 Ataxic gait: Secondary | ICD-10-CM | POA: Diagnosis not present

## 2023-03-15 DIAGNOSIS — M25561 Pain in right knee: Secondary | ICD-10-CM | POA: Diagnosis not present

## 2023-03-20 DIAGNOSIS — M25561 Pain in right knee: Secondary | ICD-10-CM | POA: Diagnosis not present

## 2023-03-20 DIAGNOSIS — M25562 Pain in left knee: Secondary | ICD-10-CM | POA: Diagnosis not present

## 2023-03-20 DIAGNOSIS — M5416 Radiculopathy, lumbar region: Secondary | ICD-10-CM | POA: Diagnosis not present

## 2023-03-20 DIAGNOSIS — R260 Ataxic gait: Secondary | ICD-10-CM | POA: Diagnosis not present

## 2023-03-22 DIAGNOSIS — R260 Ataxic gait: Secondary | ICD-10-CM | POA: Diagnosis not present

## 2023-03-22 DIAGNOSIS — M25562 Pain in left knee: Secondary | ICD-10-CM | POA: Diagnosis not present

## 2023-03-22 DIAGNOSIS — M25561 Pain in right knee: Secondary | ICD-10-CM | POA: Diagnosis not present

## 2023-03-22 DIAGNOSIS — M5416 Radiculopathy, lumbar region: Secondary | ICD-10-CM | POA: Diagnosis not present

## 2023-04-04 DIAGNOSIS — R260 Ataxic gait: Secondary | ICD-10-CM | POA: Diagnosis not present

## 2023-04-04 DIAGNOSIS — M25561 Pain in right knee: Secondary | ICD-10-CM | POA: Diagnosis not present

## 2023-04-04 DIAGNOSIS — M5416 Radiculopathy, lumbar region: Secondary | ICD-10-CM | POA: Diagnosis not present

## 2023-04-04 DIAGNOSIS — M25562 Pain in left knee: Secondary | ICD-10-CM | POA: Diagnosis not present

## 2023-04-06 DIAGNOSIS — M25561 Pain in right knee: Secondary | ICD-10-CM | POA: Diagnosis not present

## 2023-04-06 DIAGNOSIS — M25562 Pain in left knee: Secondary | ICD-10-CM | POA: Diagnosis not present

## 2023-04-06 DIAGNOSIS — M5416 Radiculopathy, lumbar region: Secondary | ICD-10-CM | POA: Diagnosis not present

## 2023-04-06 DIAGNOSIS — R260 Ataxic gait: Secondary | ICD-10-CM | POA: Diagnosis not present

## 2023-04-10 DIAGNOSIS — M25562 Pain in left knee: Secondary | ICD-10-CM | POA: Diagnosis not present

## 2023-04-10 DIAGNOSIS — M545 Low back pain, unspecified: Secondary | ICD-10-CM | POA: Diagnosis not present

## 2023-04-10 DIAGNOSIS — F331 Major depressive disorder, recurrent, moderate: Secondary | ICD-10-CM | POA: Diagnosis not present

## 2023-04-10 DIAGNOSIS — E7800 Pure hypercholesterolemia, unspecified: Secondary | ICD-10-CM | POA: Diagnosis not present

## 2023-04-10 DIAGNOSIS — M25561 Pain in right knee: Secondary | ICD-10-CM | POA: Diagnosis not present

## 2023-04-10 DIAGNOSIS — E039 Hypothyroidism, unspecified: Secondary | ICD-10-CM | POA: Diagnosis not present

## 2023-04-10 DIAGNOSIS — I10 Essential (primary) hypertension: Secondary | ICD-10-CM | POA: Diagnosis not present

## 2023-04-10 DIAGNOSIS — Z5181 Encounter for therapeutic drug level monitoring: Secondary | ICD-10-CM | POA: Diagnosis not present

## 2023-04-10 DIAGNOSIS — Z79899 Other long term (current) drug therapy: Secondary | ICD-10-CM | POA: Diagnosis not present

## 2023-04-10 DIAGNOSIS — C50912 Malignant neoplasm of unspecified site of left female breast: Secondary | ICD-10-CM | POA: Diagnosis not present

## 2023-04-10 DIAGNOSIS — M5416 Radiculopathy, lumbar region: Secondary | ICD-10-CM | POA: Diagnosis not present

## 2023-04-10 DIAGNOSIS — R260 Ataxic gait: Secondary | ICD-10-CM | POA: Diagnosis not present

## 2023-04-17 DIAGNOSIS — M25562 Pain in left knee: Secondary | ICD-10-CM | POA: Diagnosis not present

## 2023-04-17 DIAGNOSIS — M5416 Radiculopathy, lumbar region: Secondary | ICD-10-CM | POA: Diagnosis not present

## 2023-04-17 DIAGNOSIS — R260 Ataxic gait: Secondary | ICD-10-CM | POA: Diagnosis not present

## 2023-04-17 DIAGNOSIS — M25561 Pain in right knee: Secondary | ICD-10-CM | POA: Diagnosis not present

## 2023-04-20 DIAGNOSIS — R260 Ataxic gait: Secondary | ICD-10-CM | POA: Diagnosis not present

## 2023-04-20 DIAGNOSIS — M5416 Radiculopathy, lumbar region: Secondary | ICD-10-CM | POA: Diagnosis not present

## 2023-04-20 DIAGNOSIS — M25561 Pain in right knee: Secondary | ICD-10-CM | POA: Diagnosis not present

## 2023-04-20 DIAGNOSIS — M25562 Pain in left knee: Secondary | ICD-10-CM | POA: Diagnosis not present

## 2023-05-01 DIAGNOSIS — M25562 Pain in left knee: Secondary | ICD-10-CM | POA: Diagnosis not present

## 2023-05-01 DIAGNOSIS — M25561 Pain in right knee: Secondary | ICD-10-CM | POA: Diagnosis not present

## 2023-05-01 DIAGNOSIS — M5416 Radiculopathy, lumbar region: Secondary | ICD-10-CM | POA: Diagnosis not present

## 2023-05-01 DIAGNOSIS — R260 Ataxic gait: Secondary | ICD-10-CM | POA: Diagnosis not present

## 2023-05-15 DIAGNOSIS — M5416 Radiculopathy, lumbar region: Secondary | ICD-10-CM | POA: Diagnosis not present

## 2023-05-15 DIAGNOSIS — M25561 Pain in right knee: Secondary | ICD-10-CM | POA: Diagnosis not present

## 2023-05-15 DIAGNOSIS — R260 Ataxic gait: Secondary | ICD-10-CM | POA: Diagnosis not present

## 2023-05-15 DIAGNOSIS — M25562 Pain in left knee: Secondary | ICD-10-CM | POA: Diagnosis not present

## 2023-05-17 DIAGNOSIS — Z853 Personal history of malignant neoplasm of breast: Secondary | ICD-10-CM | POA: Diagnosis not present

## 2023-05-17 DIAGNOSIS — C50912 Malignant neoplasm of unspecified site of left female breast: Secondary | ICD-10-CM | POA: Diagnosis not present

## 2023-05-17 DIAGNOSIS — Z08 Encounter for follow-up examination after completed treatment for malignant neoplasm: Secondary | ICD-10-CM | POA: Diagnosis not present

## 2023-05-22 DIAGNOSIS — R260 Ataxic gait: Secondary | ICD-10-CM | POA: Diagnosis not present

## 2023-05-22 DIAGNOSIS — M25562 Pain in left knee: Secondary | ICD-10-CM | POA: Diagnosis not present

## 2023-05-22 DIAGNOSIS — M5416 Radiculopathy, lumbar region: Secondary | ICD-10-CM | POA: Diagnosis not present

## 2023-05-22 DIAGNOSIS — M25561 Pain in right knee: Secondary | ICD-10-CM | POA: Diagnosis not present

## 2023-05-29 DIAGNOSIS — M25562 Pain in left knee: Secondary | ICD-10-CM | POA: Diagnosis not present

## 2023-05-29 DIAGNOSIS — M5416 Radiculopathy, lumbar region: Secondary | ICD-10-CM | POA: Diagnosis not present

## 2023-05-29 DIAGNOSIS — M25561 Pain in right knee: Secondary | ICD-10-CM | POA: Diagnosis not present

## 2023-05-29 DIAGNOSIS — R260 Ataxic gait: Secondary | ICD-10-CM | POA: Diagnosis not present

## 2023-06-05 DIAGNOSIS — R260 Ataxic gait: Secondary | ICD-10-CM | POA: Diagnosis not present

## 2023-06-05 DIAGNOSIS — M5416 Radiculopathy, lumbar region: Secondary | ICD-10-CM | POA: Diagnosis not present

## 2023-06-05 DIAGNOSIS — M25562 Pain in left knee: Secondary | ICD-10-CM | POA: Diagnosis not present

## 2023-06-05 DIAGNOSIS — M25561 Pain in right knee: Secondary | ICD-10-CM | POA: Diagnosis not present

## 2023-06-19 DIAGNOSIS — R260 Ataxic gait: Secondary | ICD-10-CM | POA: Diagnosis not present

## 2023-06-19 DIAGNOSIS — M5416 Radiculopathy, lumbar region: Secondary | ICD-10-CM | POA: Diagnosis not present

## 2023-06-19 DIAGNOSIS — M25562 Pain in left knee: Secondary | ICD-10-CM | POA: Diagnosis not present

## 2023-06-19 DIAGNOSIS — M25561 Pain in right knee: Secondary | ICD-10-CM | POA: Diagnosis not present

## 2023-06-26 DIAGNOSIS — M25562 Pain in left knee: Secondary | ICD-10-CM | POA: Diagnosis not present

## 2023-06-26 DIAGNOSIS — M5416 Radiculopathy, lumbar region: Secondary | ICD-10-CM | POA: Diagnosis not present

## 2023-06-26 DIAGNOSIS — R260 Ataxic gait: Secondary | ICD-10-CM | POA: Diagnosis not present

## 2023-06-26 DIAGNOSIS — M25561 Pain in right knee: Secondary | ICD-10-CM | POA: Diagnosis not present

## 2023-07-10 DIAGNOSIS — R260 Ataxic gait: Secondary | ICD-10-CM | POA: Diagnosis not present

## 2023-07-10 DIAGNOSIS — M25561 Pain in right knee: Secondary | ICD-10-CM | POA: Diagnosis not present

## 2023-07-10 DIAGNOSIS — M25562 Pain in left knee: Secondary | ICD-10-CM | POA: Diagnosis not present

## 2023-07-10 DIAGNOSIS — M5416 Radiculopathy, lumbar region: Secondary | ICD-10-CM | POA: Diagnosis not present

## 2023-07-17 DIAGNOSIS — M5416 Radiculopathy, lumbar region: Secondary | ICD-10-CM | POA: Diagnosis not present

## 2023-07-17 DIAGNOSIS — M25561 Pain in right knee: Secondary | ICD-10-CM | POA: Diagnosis not present

## 2023-07-17 DIAGNOSIS — R260 Ataxic gait: Secondary | ICD-10-CM | POA: Diagnosis not present

## 2023-07-17 DIAGNOSIS — M25562 Pain in left knee: Secondary | ICD-10-CM | POA: Diagnosis not present

## 2023-07-24 DIAGNOSIS — R260 Ataxic gait: Secondary | ICD-10-CM | POA: Diagnosis not present

## 2023-07-24 DIAGNOSIS — M5416 Radiculopathy, lumbar region: Secondary | ICD-10-CM | POA: Diagnosis not present

## 2023-07-24 DIAGNOSIS — M25562 Pain in left knee: Secondary | ICD-10-CM | POA: Diagnosis not present

## 2023-07-24 DIAGNOSIS — M25561 Pain in right knee: Secondary | ICD-10-CM | POA: Diagnosis not present

## 2023-08-01 DIAGNOSIS — R260 Ataxic gait: Secondary | ICD-10-CM | POA: Diagnosis not present

## 2023-08-01 DIAGNOSIS — M25562 Pain in left knee: Secondary | ICD-10-CM | POA: Diagnosis not present

## 2023-08-01 DIAGNOSIS — M5416 Radiculopathy, lumbar region: Secondary | ICD-10-CM | POA: Diagnosis not present

## 2023-08-01 DIAGNOSIS — M25561 Pain in right knee: Secondary | ICD-10-CM | POA: Diagnosis not present

## 2023-08-07 DIAGNOSIS — M5416 Radiculopathy, lumbar region: Secondary | ICD-10-CM | POA: Diagnosis not present

## 2023-08-07 DIAGNOSIS — M25561 Pain in right knee: Secondary | ICD-10-CM | POA: Diagnosis not present

## 2023-08-07 DIAGNOSIS — R260 Ataxic gait: Secondary | ICD-10-CM | POA: Diagnosis not present

## 2023-08-07 DIAGNOSIS — M25562 Pain in left knee: Secondary | ICD-10-CM | POA: Diagnosis not present

## 2023-08-14 DIAGNOSIS — R260 Ataxic gait: Secondary | ICD-10-CM | POA: Diagnosis not present

## 2023-08-14 DIAGNOSIS — M25562 Pain in left knee: Secondary | ICD-10-CM | POA: Diagnosis not present

## 2023-08-14 DIAGNOSIS — M5416 Radiculopathy, lumbar region: Secondary | ICD-10-CM | POA: Diagnosis not present

## 2023-08-14 DIAGNOSIS — M25561 Pain in right knee: Secondary | ICD-10-CM | POA: Diagnosis not present

## 2023-08-26 ENCOUNTER — Other Ambulatory Visit: Payer: Self-pay | Admitting: Hematology and Oncology

## 2023-08-28 ENCOUNTER — Encounter: Payer: Self-pay | Admitting: Hematology and Oncology

## 2023-08-28 DIAGNOSIS — H04123 Dry eye syndrome of bilateral lacrimal glands: Secondary | ICD-10-CM | POA: Diagnosis not present

## 2023-08-28 DIAGNOSIS — H52203 Unspecified astigmatism, bilateral: Secondary | ICD-10-CM | POA: Diagnosis not present

## 2023-09-05 DIAGNOSIS — M25561 Pain in right knee: Secondary | ICD-10-CM | POA: Diagnosis not present

## 2023-09-05 DIAGNOSIS — M25562 Pain in left knee: Secondary | ICD-10-CM | POA: Diagnosis not present

## 2023-09-05 DIAGNOSIS — M5416 Radiculopathy, lumbar region: Secondary | ICD-10-CM | POA: Diagnosis not present

## 2023-09-05 DIAGNOSIS — R260 Ataxic gait: Secondary | ICD-10-CM | POA: Diagnosis not present

## 2023-09-11 DIAGNOSIS — R260 Ataxic gait: Secondary | ICD-10-CM | POA: Diagnosis not present

## 2023-09-11 DIAGNOSIS — M25561 Pain in right knee: Secondary | ICD-10-CM | POA: Diagnosis not present

## 2023-09-11 DIAGNOSIS — M5416 Radiculopathy, lumbar region: Secondary | ICD-10-CM | POA: Diagnosis not present

## 2023-09-11 DIAGNOSIS — M25562 Pain in left knee: Secondary | ICD-10-CM | POA: Diagnosis not present

## 2023-09-15 ENCOUNTER — Other Ambulatory Visit: Payer: Self-pay

## 2023-09-15 ENCOUNTER — Telehealth: Payer: Self-pay

## 2023-09-15 ENCOUNTER — Other Ambulatory Visit (INDEPENDENT_AMBULATORY_CARE_PROVIDER_SITE_OTHER): Payer: Self-pay

## 2023-09-15 ENCOUNTER — Ambulatory Visit: Payer: Medicare PPO | Admitting: Physician Assistant

## 2023-09-15 DIAGNOSIS — M25551 Pain in right hip: Secondary | ICD-10-CM

## 2023-09-15 DIAGNOSIS — M545 Low back pain, unspecified: Secondary | ICD-10-CM

## 2023-09-15 NOTE — Progress Notes (Signed)
Office Visit Note   Patient: Jillian Buchanan           Date of Birth: 28-May-1933           MRN: [id number] Visit Date: 09/15/2023              Requested by: Renford Dills, MD [address] [place],  [place] [address] PCP: Renford Dills, MD  Chief Complaint  Patient presents with   Right Thigh - Pain   Left Thigh - Pain   Right Hip - Pain      HPI: Jillian Buchanan is a very pleasant 89 year old woman who is a former patient of Dr. Cleophas Dunker.  She comes in today complaining of posterior right lower back pain side pain going down to her leg.  Also has bilateral lower extremity swelling.  Denies any recent injury.  She has seen Dr. Cleophas Dunker for her knees in the past.  She especially notices symptoms when she tries to walk for a bit and has to sit down and rest  Assessment & Plan: Visit Diagnoses:  1. Acute bilateral low back pain, unspecified whether sciatica present   2. Pain in right hip     Plan: I think by exam and x-ray most of her difficulties are coming from her lower back.  She did see Dr. Cleophas Dunker for this 2 years ago.  She complains of more radicular type symptoms today.  Could have some stenosis based on her history.  She does have a slight increase in an anterior listhesis at L4-L5.  I had a long conversation and reviewed Dr. Hoy Register notes.  I think she would do well getting an MRI and then following up with Ellin Goodie to see what the next course of action if perhaps a steroid injection might be helpful with regards to her swelling in her legs I think this is more vascular in nature she says she is on a diuretic and I have instructed her to follow-up with her primary care to see if this needs to be adjusted  Follow-Up Instructions: No follow-ups on file.   Ortho Exam  Patient is alert, oriented, no adenopathy, well-dressed, normal affect, normal respiratory effort. Examination of her lower back she is neurovascularly intact compartments are soft and nontender  she has good dorsiflexion plantarflexion eversion inversion fairly good strength with resisted flexion and extension.  She does have some tenderness to palpation over the lower back around L4  Imaging: XR HIP UNILAT W OR W/O PELVIS 2-3 VIEWS RIGHT  Result Date: 09/15/2023 Radiographs of her hip demonstrate some joint space narrowing and sclerotic changes  XR Lumbar Spine 2-3 Views  Result Date: 09/15/2023 Radiographs of the lumbar spine compared to 2 years ago she still has facet arthropathy which is advanced a little bit anterior listhesis at L4-5 has progressed just a small amount no acute fractures noted  No images are attached to the encounter.  Labs: No results found for: "HGBA1C", "ESRSEDRATE", "CRP", "LABURIC", "REPTSTATUS", "GRAMSTAIN", "CULT", "LABORGA"   Lab Results  Component Value Date   ALBUMIN 4.1 06/15/2022   ALBUMIN 4.1 04/12/2022   ALBUMIN 3.9 02/09/2022    No results found for: "MG" No results found for: "VD25OH"  No results found for: "PREALBUMIN"    Latest Ref Rng & Units 06/15/2022   10:14 AM 04/12/2022    1:25 PM 02/09/2022    1:33 PM  CBC EXTENDED  WBC 4.0 - 10.5 K/uL 3.9  4.6  4.6   RBC  3.87 - 5.11 MIL/uL 4.07  4.10  4.05   Hemoglobin 12.0 - 15.0 g/dL 24.4  01.0  27.2   HCT 36.0 - 46.0 % 35.3  36.0  35.0   Platelets 150 - 400 K/uL 211  215  211   NEUT# 1.7 - 7.7 K/uL 1.9  2.3  2.5   Lymph# 0.7 - 4.0 K/uL 1.3  1.4  1.4      There is no height or weight on file to calculate BMI.  Orders:  Orders Placed This Encounter  Procedures   XR Lumbar Spine 2-3 Views   XR HIP UNILAT W OR W/O PELVIS 2-3 VIEWS RIGHT   MR Lumbar Spine w/o contrast   No orders of the defined types were placed in this encounter.    Procedures: No procedures performed  Clinical Data: No additional findings.  ROS:  All other systems negative, except as noted in the HPI. Review of Systems  Objective: Vital Signs: There were no vitals taken for this  visit.  Specialty Comments:  No specialty comments available.  PMFS History: Patient Active Problem List   Diagnosis Date Noted   Low back pain 09/02/2021   Trigger finger, right middle finger 08/24/2021   Genetic testing 06/18/2021   Family history of breast cancer 05/26/2021   Family history of bone cancer 05/26/2021   Malignant neoplasm of upper-inner quadrant of left breast in female, estrogen receptor positive ([facility]) 05/24/2021   Bilateral primary osteoarthritis of knee 11/12/2019   Obesity 11/12/2019   Chronic right shoulder pain 11/27/2017   Unilateral primary osteoarthritis, left knee 11/27/2017   Past Medical History:  Diagnosis Date   Anxiety    self reported   Breast cancer ([facility])    Cataract    Family history of bone cancer    Family history of breast cancer    Hypertension     Family History  Problem Relation Age of Onset   Breast cancer Maternal Aunt        x5 aunts, dx >50   Breast cancer Paternal Aunt        dx >50   Breast cancer Cousin        x4 maternal first cousins   Bone cancer Daughter 60    Past Surgical History:  Procedure Laterality Date   ABDOMINAL HYSTERECTOMY     BREAST LUMPECTOMY  1988   BREAST LUMPECTOMY WITH RADIOACTIVE SEED LOCALIZATION Left 06/15/2021   Procedure: LEFT BREAST LUMPECTOMY WITH RADIOACTIVE SEED LOCALIZATION;  Surgeon: Emelia Loron, MD;  Location: [facility];  Service: General;  Laterality: Left;   INTRAOCULAR PROSTHESES INSERTION  8/248/2005   Social History   Occupational History   Not on file  Tobacco Use   Smoking status: Never   Smokeless tobacco: Never  Vaping Use   Vaping status: Never Used  Substance and Sexual Activity   Alcohol use: Yes    Comment: rarely, social   Drug use: No   Sexual activity: Never

## 2023-09-15 NOTE — Telephone Encounter (Signed)
Patient called the triage line because [facility] [place] had not sent in the muscle relaxer for her lower back. Mary Anne's intent was to send in #30 of Methocarbamol 500 mg 1 q 8 hrs prn to [facility] [address]. Would you please send this in for the patient since [facility] is no longer here?

## 2023-09-16 ENCOUNTER — Other Ambulatory Visit: Payer: Self-pay | Admitting: Surgical

## 2023-09-16 MED ORDER — METHOCARBAMOL 500 MG PO TABS: 500.0000 mg | ORAL_TABLET | Freq: Three times a day (TID) | ORAL | 1 refills | Status: AC | PRN

## 2023-09-16 NOTE — Telephone Encounter (Signed)
Sent in

## 2023-09-18 ENCOUNTER — Inpatient Hospital Stay: Payer: Medicare PPO | Attending: Hematology and Oncology | Admitting: Hematology and Oncology

## 2023-09-18 ENCOUNTER — Other Ambulatory Visit: Payer: Self-pay

## 2023-09-18 VITALS — BP 149/65 | HR 74 | Temp 97.7°F | Resp 18 | Ht 64.0 in | Wt 216.1 lb

## 2023-09-18 DIAGNOSIS — C50212 Malignant neoplasm of upper-inner quadrant of left female breast: Secondary | ICD-10-CM | POA: Insufficient documentation

## 2023-09-18 DIAGNOSIS — Z9221 Personal history of antineoplastic chemotherapy: Secondary | ICD-10-CM | POA: Diagnosis not present

## 2023-09-18 DIAGNOSIS — Z170 Estrogen receptor positive status [ER+]: Secondary | ICD-10-CM | POA: Diagnosis not present

## 2023-09-18 DIAGNOSIS — Z79811 Long term (current) use of aromatase inhibitors: Secondary | ICD-10-CM | POA: Insufficient documentation

## 2023-09-18 DIAGNOSIS — M5430 Sciatica, unspecified side: Secondary | ICD-10-CM | POA: Insufficient documentation

## 2023-09-18 MED ORDER — ANASTROZOLE 1 MG PO TABS: 1.0000 mg | ORAL_TABLET | Freq: Every day | ORAL | 3 refills | Status: DC

## 2023-09-18 NOTE — Assessment & Plan Note (Addendum)
left lumpectomy 06/15/2021 for a pT1c pNX invasive ductal carcinoma grade 2 size: 1.4 cm with intermediate grade DCIS, with negative margins, ER 100%, PR 2%, HER2 positive, Ki-67 15% Adjuvant Herceptin completed 07/06/2022   Current treatment:  anastrozole started 07/12/2021 Anastrozole toxicities:  Intermittent fatigue and hot flashes  Joint stiffness and achiness which gets better with activity patient uses a cane to get around and has knee brace.   Breast cancer surveillance: Breast exam 09/18/2023: Benign Mammogram 05/17/2023 at [facility]: Benign breast density category B   Return to clinic in 1 year for follow-up

## 2023-09-18 NOTE — Progress Notes (Signed)
Patient Care Team: Renford Dills, MD as PCP - General (Internal Medicine) Emelia Loron, MD as Consulting Physician (General Surgery) Lonie Peak, MD as Attending Physician (Radiation Oncology) Valeria Batman, MD (Inactive) as Consulting Physician (Orthopedic Surgery) Mckinley Jewel, MD as Consulting Physician (Ophthalmology) Serena Croissant, MD as Consulting Physician (Hematology and Oncology)  DIAGNOSIS:  Encounter Diagnosis  Name Primary?   Malignant neoplasm of upper-inner quadrant of left breast in female, estrogen receptor positive (HCC) Yes    SUMMARY OF ONCOLOGIC HISTORY: Oncology History  Malignant neoplasm of upper-inner quadrant of left breast in female, estrogen receptor positive (HCC)  05/18/2021 Initial Diagnosis    left breast upper inner quadrant biopsy 05/18/2021 for a clinical T1b N0, stage IA invasive ductal carcinoma, triple positive, with an MIB-1 of 15%   05/26/2021 Cancer Staging   Staging form: Breast, AJCC 8th Edition - Clinical stage from 05/26/2021: Stage IA (cT1b, cN0, cM0, G2, ER+, PR+, HER2+) - Signed by Lowella Dell, MD on 05/26/2021 Stage prefix: Initial diagnosis Histologic grading system: 3 grade system Laterality: Left Staged by: Pathologist and managing physician Stage used in treatment planning: Yes National guidelines used in treatment planning: Yes Type of national guideline used in treatment planning: NCCN   06/15/2021 Surgery   left lumpectomy 06/15/2021 for a pT1c pNX invasive ductal carcinoma grade 2, with negative margins   06/18/2021 Genetic Testing   Negative genetic testing:  No pathogenic variants detected on the [employer] CancerNext-Expanded + RNAinsight panel. Three variants of uncertain significance (VUS) were detected - one in the ATM gene called p.F1265L (c.3793T>C), a second in the LTZR1 gene called c.2414delA (H.Q469GEX*5), and a third in the NTHL1 gene called p.P58L (c.173C>T). The report date is  06/18/2021.  The CancerNext-Expanded + RNAinsight gene panel offered by [employer] and includes sequencing and rearrangement analysis for the following 77 genes: AIP, ALK, APC, ATM, AXIN2, BAP1, BARD1, BLM, BMPR1A, BRCA1, BRCA2, BRIP1, CDC73, CDH1, CDK4, CDKN1B, CDKN2A, CHEK2, CTNNA1, DICER1, FANCC, FH, FLCN, GALNT12, KIF1B, LZTR1, MAX, MEN1, MET, MLH1, MSH2, MSH3, MSH6, MUTYH, NBN, NF1, NF2, NTHL1, PALB2, PHOX2B, PMS2, POT1, PRKAR1A, PTCH1, PTEN, RAD51C, RAD51D, RB1, RECQL, RET, SDHA, SDHAF2, SDHB, SDHC, SDHD, SMAD4, SMARCA4, SMARCB1, SMARCE1, STK11, SUFU, TMEM127, TP53, TSC1, TSC2, VHL and XRCC2 (sequencing and deletion/duplication); EGFR, EGLN1, HOXB13, KIT, MITF, PDGFRA, POLD1 and POLE (sequencing only); EPCAM and GREM1 (deletion/duplication only). RNA data is routinely analyzed for use in variant interpretation for all genes.    07/12/2021 -  Anti-estrogen oral therapy   anastrozole started 07/12/2021   07/15/2021 -  Chemotherapy   Patient is on Treatment Plan : BREAST Trastuzumab q21d       CHIEF COMPLIANT: Follow-up on anastrozole therapy  HISTORY OF PRESENT ILLNESS:   History of Present Illness   Jillian Buchanan, a survivor of breast cancer for over two years, presents with ongoing side effects from anastrozole, including hot flashes and joint stiffness. Recently, she has developed sciatica pain that started about a week ago. She also reports new-onset tingling in her fingers and feet. She has been attending physical therapy and exercises in a hydro pool. She has been walking on a treadmill without shoes, which she suspects might be affecting her feet. She also reports feeling weak, particularly in her right leg, and has been experiencing balance issues. She uses a cane intermittently due to her knee problem.         ALLERGIES:  has No Known Allergies.  MEDICATIONS:  Current Outpatient Medications  Medication Sig Dispense Refill  methocarbamol (ROBAXIN) 500 MG tablet Take 1 tablet (500  mg total) by mouth every 8 (eight) hours as needed for muscle spasms. 30 tablet 1   acetaminophen (TYLENOL) 325 MG tablet      ALPRAZolam (XANAX) 0.5 MG tablet TAKE 1 TABLET BY MOUTH EVERY DAY AS NEEDED FOR ANXIETY  3   amLODipine (NORVASC) 5 MG tablet Take 5 mg by mouth daily.       anastrozole (ARIMIDEX) 1 MG tablet Take 1 tablet (1 mg total) by mouth daily. 90 tablet 3   aspirin 81 MG EC tablet TAKE ONE (1) TABLET EACH DAY  3   bumetanide (BUMEX) 0.5 MG tablet Take 0.5 mg by mouth daily.       carvedilol (COREG) 3.125 MG tablet TAKE 1 TABLET BY MOUTH TWICE A DAY WITH A MEAL 180 tablet 3   hydroquinone 4 % cream AS DIRECTED TO AFFECTED AREA TWICE A DAY AS NEEDED EXTERNALLY     levothyroxine (SYNTHROID, LEVOTHROID) 88 MCG tablet Take 88 mcg by mouth daily before breakfast.     losartan (COZAAR) 100 MG tablet Take 100 mg by mouth daily.     naproxen sodium (ALEVE) 220 MG tablet Take 220 mg by mouth as needed.     rosuvastatin (CRESTOR) 5 MG tablet Take 5 mg by mouth daily.     triamcinolone cream (KENALOG) 0.5 % APPLY TO AFFECTED AREA OF LEG TWICE A DAY AS NEEDED  3   No current facility-administered medications for this visit.    PHYSICAL EXAMINATION: ECOG PERFORMANCE STATUS: 1 - Symptomatic but completely ambulatory  Vitals:   09/18/23 1027  BP: (!) 149/65  Pulse: 74  Resp: 18  Temp: 97.7 F (36.5 C)  SpO2: 100%   Filed Weights   09/18/23 1027  Weight: 216 lb 1.6 oz (98 kg)    Physical Exam   BREAST: Presence of scar tissue, no abnormalities of concern.      (exam performed in the presence of a chaperone)  LABORATORY DATA:  I have reviewed the data as listed    Latest Ref Rng & Units 06/15/2022   10:14 AM 04/12/2022    1:25 PM 02/09/2022    1:34 PM  CMP  Glucose 70 - 99 mg/dL 99  161  096   BUN 8 - 23 mg/dL 23  22  23    Creatinine 0.44 - 1.00 mg/dL 0.45  4.09  8.11   Sodium 135 - 145 mmol/L 138  140  138   Potassium 3.5 - 5.1 mmol/L 3.9  3.7  3.5   Chloride 98 -  111 mmol/L 107  106  105   CO2 22 - 32 mmol/L 26  28  25    Calcium 8.9 - 10.3 mg/dL 9.7  9.9  9.6   Total Protein 6.5 - 8.1 g/dL 7.8  7.8  7.6   Total Bilirubin 0.3 - 1.2 mg/dL 0.5  0.4  0.6   Alkaline Phos 38 - 126 U/L 72  64  65   AST 15 - 41 U/L 16  14  18    ALT 0 - 44 U/L 13  11  15      Lab Results  Component Value Date   WBC 3.9 (L) 06/15/2022   HGB 11.8 (L) 06/15/2022   HCT 35.3 (L) 06/15/2022   MCV 86.7 06/15/2022   PLT 211 06/15/2022   NEUTROABS 1.9 06/15/2022    ASSESSMENT & PLAN:  Malignant neoplasm of upper-inner quadrant of left breast in female, estrogen  receptor positive (HCC) left lumpectomy 06/15/2021 for a pT1c pNX invasive ductal carcinoma grade 2 size: 1.4 cm with intermediate grade DCIS, with negative margins, ER 100%, PR 2%, HER2 positive, Ki-67 15% Adjuvant Herceptin completed 07/06/2022   Current treatment:  anastrozole started 07/12/2021 Anastrozole toxicities:  Intermittent fatigue and hot flashes  Joint stiffness and achiness which gets better with activity patient uses a cane to get around and has knee brace.   Breast cancer surveillance: Breast exam 09/18/2023: Benign Mammogram 05/17/2023 at [facility]: Benign breast density category B   Return to clinic in 1 year for follow-up ------------------------------------- Assessment and Plan    Breast Cancer Survivor On Anastrozole for over 2 years with ongoing hot flashes and joint stiffness. No issues with medication adherence. -Continue Anastrozole. -[facility] [address]  New onset Sciatica Started a week ago with associated tingling in fingers and feet. Patient has been attending physical therapy and using a hydro pool. -Advise patient to wear appropriate footwear during exercise. -Recommend over-the-counter B12 supplement for tingling sensation. -Advise patient to discuss strengthening exercises with physical therapist.  Follow-up Plan for annual follow-up.           No orders of the defined types were placed in this encounter.  The patient has a good understanding of the overall plan. she agrees with it. she will call with any problems that may develop before the next visit here. Total time spent: 30 mins including face to face time and time spent for planning, charting and co-ordination of care   Tamsen Meek, MD 09/18/23

## 2023-09-19 ENCOUNTER — Other Ambulatory Visit: Payer: Self-pay

## 2023-09-20 ENCOUNTER — Encounter: Payer: Self-pay | Admitting: Hematology and Oncology

## 2023-09-20 ENCOUNTER — Other Ambulatory Visit: Payer: Self-pay

## 2023-09-22 DIAGNOSIS — Z23 Encounter for immunization: Secondary | ICD-10-CM | POA: Diagnosis not present

## 2023-09-22 DIAGNOSIS — M545 Low back pain, unspecified: Secondary | ICD-10-CM | POA: Diagnosis not present

## 2023-10-03 ENCOUNTER — Ambulatory Visit
Admission: RE | Admit: 2023-10-03 | Discharge: 2023-10-03 | Disposition: A | Discharge status: Home / Self Care | Payer: Medicare PPO | Source: Ambulatory Visit | Attending: Physician Assistant

## 2023-10-03 DIAGNOSIS — M545 Low back pain, unspecified: Secondary | ICD-10-CM

## 2023-10-03 DIAGNOSIS — M47816 Spondylosis without myelopathy or radiculopathy, lumbar region: Secondary | ICD-10-CM | POA: Diagnosis not present

## 2023-10-12 DIAGNOSIS — E039 Hypothyroidism, unspecified: Secondary | ICD-10-CM | POA: Diagnosis not present

## 2023-10-12 DIAGNOSIS — N1831 Chronic kidney disease, stage 3a: Secondary | ICD-10-CM | POA: Diagnosis not present

## 2023-10-12 DIAGNOSIS — I10 Essential (primary) hypertension: Secondary | ICD-10-CM | POA: Diagnosis not present

## 2023-10-12 DIAGNOSIS — F331 Major depressive disorder, recurrent, moderate: Secondary | ICD-10-CM | POA: Diagnosis not present

## 2023-10-12 DIAGNOSIS — C50912 Malignant neoplasm of unspecified site of left female breast: Secondary | ICD-10-CM | POA: Diagnosis not present

## 2023-10-12 DIAGNOSIS — E7800 Pure hypercholesterolemia, unspecified: Secondary | ICD-10-CM | POA: Diagnosis not present

## 2023-10-17 ENCOUNTER — Encounter: Payer: Self-pay | Admitting: Physical Medicine and Rehabilitation

## 2023-10-17 ENCOUNTER — Ambulatory Visit: Payer: Medicare PPO | Admitting: Physical Medicine and Rehabilitation

## 2023-10-17 DIAGNOSIS — M5441 Lumbago with sciatica, right side: Secondary | ICD-10-CM | POA: Diagnosis not present

## 2023-10-17 DIAGNOSIS — M5416 Radiculopathy, lumbar region: Secondary | ICD-10-CM

## 2023-10-17 DIAGNOSIS — G8929 Other chronic pain: Secondary | ICD-10-CM

## 2023-10-17 MED ORDER — DIAZEPAM 5 MG PO TABS: ORAL_TABLET | ORAL | 0 refills | Status: AC

## 2023-10-17 NOTE — Progress Notes (Signed)
Complaining of pain and pressure in lower back with standing, does ok with sitting and laying, onset x 2 months: NKI, Pain in Bilat LE, Tingling & Numbness in feet, has good and bad days. Also states that she has numbness and tingling in Right UE

## 2023-10-17 NOTE — Progress Notes (Signed)
Jillian Buchanan - 89 y.o. female MRN [id number]  Date of birth: [birth year]  Office Visit Note: Visit Date: 10/17/2023 PCP: Renford Dills, MD Referred by: Renford Dills, MD  Subjective: Chief Complaint  Patient presents with   Lower Back - Pain   HPI: Jillian Buchanan is a 89 y.o. female who comes in today for evaluation of chronic, worsening and severe right sided lower back pain, pain radiating down left lateral leg to foot, feeling of weakness to right leg and numbness/tingling to both feet. Also reports balance issues. Symptoms ongoing for several months. Right lower back pain and weakness to right leg seem to be most severe and concerning symptoms. She describes pain as sore and weak sensation, currently rates as 7 out of 10. Some relief of pain with home exercise regimen, rest and use of medications. Currently undergoing formal physical therapy with [facility] in [place], some relief of pain with these treatments. No history of lumbar surgery/injections. Recent lumbar MRI imaging shows multi level lumbar spine degeneration, degenerative anterolisthesis at L3-L4 and below, there is non compressive foraminal stenosis on the right at L3-L4 and bilaterally at L4-L5. Bilateral subarticular narrowing at L4-L5. No high grade spinal canal stenosis noted. Patient denies focal weakness, numbness and tingling. No recent trauma or falls. Currently ambulates with cane. History of breast cancer 2022.       Review of Systems  Cardiovascular:  Positive for leg swelling.  Musculoskeletal:  Positive for back pain.  Neurological:  Positive for tingling and weakness.  All other systems reviewed and are negative.  Otherwise per HPI.  Assessment & Plan: Visit Diagnoses:    ICD-10-CM   1. Chronic right-sided low back pain with right-sided sciatica  M54.41 Ambulatory referral to Physical Medicine Rehab   G89.29     2. Lumbar radiculopathy  M54.16 Ambulatory referral to Physical Medicine Rehab       Plan:  Findings:  Chronic, worsening and severe right sided lower back pain, pain radiating down left lateral leg to foot, feeling of weakness to right leg and numbness/tingling to both feet. History of balance issues. Patient continues to have severe symptoms despite good conservative therapies such as home exercise regimen, rest and use of medications. Her exam today is non focal, good strength noted to bilateral lower extremities. Chronic swelling to bilateral ankles. Her clinical presentation and exam are complex, there is subarticular narrowing at L4-L5. We discussed treatment plan in detail today, next step is to perform diagnostic and hopefully therapeutic right L4-L5 interlaminar epidural steroid injection under fluoroscopic guidance. She is not currently taking anticoagulant medication. If good relief of pain with injection we can repeat this procedure infrequently as needed. Patient did voice issues with anxiety related to injection, I prescribed pre-procedure Valium for her to take prior injection. Dr. Alvester Morin at bedside to discuss injection procedure, she has no questions at this time. No red flag symptoms noted upon exam today.     Meds & Orders:  Meds ordered this encounter  Medications   diazepam (VALIUM) 5 MG tablet    Sig: Take one tablet by mouth with food one hour prior to procedure. May repeat 30 minutes prior if needed.    Dispense:  2 tablet    Refill:  0    Orders Placed This Encounter  Procedures   Ambulatory referral to Physical Medicine Rehab    Follow-up: Return for Right L4-L5 interlaminar epidural steroid injection.   Procedures: No procedures performed      Clinical  History: CLINICAL DATA:  Lumbar radiculopathy with symptoms persisting for 6 weeks in treatment   EXAM: MRI LUMBAR SPINE WITHOUT CONTRAST   TECHNIQUE: Multiplanar, multisequence MR imaging of the lumbar spine was performed. No intravenous contrast was administered.   COMPARISON:  None Available.    FINDINGS: Segmentation:  Standard.   Alignment:  Degenerative anterolisthesis at L3-4 and below   Vertebrae:  No fracture, evidence of discitis, or bone lesion.   Conus medullaris and cauda equina: Conus extends to the L1-2 level. Conus and cauda equina appear normal.   Paraspinal and other soft tissues: Distal colonic diverticulosis. No perispinal mass or inflammation   Disc levels:   T12- L1: Mild degenerative facet spurring.   L1-L2: Degenerative facet spurring with mild disc height loss and circumferential bulging.   L2-L3: Mild disc bulging. Degenerative facet spurring on both sides.   L3-L4: Moderate degenerative facet spurring with mild anterolisthesis. The disc is desiccated and narrowed with bulging and small right foraminal protrusion. Mild triangular narrowing of the thecal sac. Noncompressive right foraminal narrowing   L4-L5: Degenerative facet spurring which is bulky with anterolisthesis. Disc is narrowed and bulging with bilateral paracentral protrusion. Narrowing of the subarticular recesses, greater on the left but not clearly compressive. Mild bilateral foraminal narrowing   L5-S1:Degenerative facet spurring which is fairly bulky with mild anterolisthesis. No herniation or impingement.   IMPRESSION: 1. Generalized lumbar spine degeneration especially affecting facets with mild anterolisthesis at L3-4 and below. 2. Noncompressive foraminal narrowing on the right at L3-4 and bilaterally at L4-5. 3. Moderate left subarticular recess narrowing at L4-5.     Electronically Signed   By: Tiburcio Pea M.D.   On: 10/15/2023 19:23   She reports that she has never smoked. She has never used smokeless tobacco. No results for input(s): "HGBA1C", "LABURIC" in the last 8760 hours.  Objective:  VS:  HT:    WT:   BMI:     BP:   HR: bpm  TEMP: ( )  RESP:  Physical Exam Vitals and nursing note reviewed.  HENT:     Head: Normocephalic and atraumatic.      Right Ear: External ear normal.     Left Ear: External ear normal.     Nose: Nose normal.     Mouth/Throat:     Mouth: Mucous membranes are moist.  Eyes:     Extraocular Movements: Extraocular movements intact.  Cardiovascular:     Rate and Rhythm: Normal rate.     Pulses: Normal pulses.  Pulmonary:     Effort: Pulmonary effort is normal.  Abdominal:     General: Abdomen is flat. There is no distension.  Musculoskeletal:        General: Tenderness present.     Cervical back: Normal range of motion.     Comments: Patient rises from seated position to standing without difficulty. Good lumbar range of motion. No pain noted with facet loading. 5/5 strength noted with bilateral hip flexion, knee flexion/extension, ankle dorsiflexion/plantarflexion and EHL. No clonus noted bilaterally. No pain upon palpation of greater trochanters. No pain with internal/external rotation of bilateral hips. Sensation intact bilaterally. Negative slump test bilaterally. Ambulates with cane, gait unsteady.     Skin:    General: Skin is warm and dry.     Capillary Refill: Capillary refill takes less than 2 seconds.  Neurological:     General: No focal deficit present.     Mental Status: She is alert and oriented to person, place,  and time.  Psychiatric:        Mood and Affect: Mood normal.        Behavior: Behavior normal.     Ortho Exam  Imaging: No results found.  Past Medical/Family/Surgical/Social History: Medications & Allergies reviewed per EMR, new medications updated. Patient Active Problem List   Diagnosis Date Noted   Low back pain 09/02/2021   Trigger finger, right middle finger 08/24/2021   Genetic testing 06/18/2021   Family history of breast cancer 05/26/2021   Family history of bone cancer 05/26/2021   Malignant neoplasm of upper-inner quadrant of left breast in female, estrogen receptor positive (HCC) 05/24/2021   Bilateral primary osteoarthritis of knee 11/12/2019   Obesity  11/12/2019   Chronic right shoulder pain 11/27/2017   Unilateral primary osteoarthritis, left knee 11/27/2017   Past Medical History:  Diagnosis Date   Anxiety    self reported   Breast cancer (HCC)    Cataract    Family history of bone cancer    Family history of breast cancer    Hypertension    Family History  Problem Relation Age of Onset   Breast cancer Maternal Aunt        x5 aunts, dx >50   Breast cancer Paternal Aunt        dx >50   Breast cancer Cousin        x4 maternal first cousins   Bone cancer Daughter 86   Past Surgical History:  Procedure Laterality Date   ABDOMINAL HYSTERECTOMY     BREAST LUMPECTOMY  1988   BREAST LUMPECTOMY WITH RADIOACTIVE SEED LOCALIZATION Left 06/15/2021   Procedure: LEFT BREAST LUMPECTOMY WITH RADIOACTIVE SEED LOCALIZATION;  Surgeon: Emelia Loron, MD;  Location: [facility];  Service: General;  Laterality: Left;   INTRAOCULAR PROSTHESES INSERTION  8/248/2005   Social History   Occupational History   Not on file  Tobacco Use   Smoking status: Never   Smokeless tobacco: Never  Vaping Use   Vaping status: Never Used  Substance and Sexual Activity   Alcohol use: Yes    Comment: rarely, social   Drug use: No   Sexual activity: Never

## 2023-10-27 ENCOUNTER — Telehealth: Payer: Self-pay | Admitting: Physical Medicine and Rehabilitation

## 2023-10-27 NOTE — Telephone Encounter (Signed)
Patient called and wanted to know what's going on with the approval for the injection in her back. CB#[phone]

## 2023-11-02 ENCOUNTER — Encounter: Payer: Self-pay | Admitting: Hematology and Oncology

## 2023-11-09 ENCOUNTER — Ambulatory Visit (INDEPENDENT_AMBULATORY_CARE_PROVIDER_SITE_OTHER): Payer: PPO | Admitting: Physical Medicine and Rehabilitation

## 2023-11-09 ENCOUNTER — Encounter: Payer: Self-pay | Admitting: Hematology and Oncology

## 2023-11-09 ENCOUNTER — Other Ambulatory Visit: Payer: Self-pay

## 2023-11-09 VITALS — BP 149/81

## 2023-11-09 DIAGNOSIS — M5416 Radiculopathy, lumbar region: Secondary | ICD-10-CM | POA: Diagnosis not present

## 2023-11-09 MED ORDER — METHYLPREDNISOLONE ACETATE 40 MG/ML IJ SUSP
40.0000 mg | Freq: Once | INTRAMUSCULAR | Status: AC
  Administered 2023-11-09: 40 mg

## 2023-11-09 NOTE — Procedures (Signed)
 Lumbar Epidural Steroid Injection - Interlaminar Approach with Fluoroscopic Guidance  Patient: Jillian Buchanan      Date of Birth: [birth year] MRN: [id number] PCP: Rexanne Ingle, MD      Visit Date: 11/09/2023   Universal Protocol:     Consent Given By: the patient  Position: PRONE  Additional Comments: Vital signs were monitored before and after the procedure. Patient was prepped and draped in the usual sterile fashion. The correct patient, procedure, and site was verified.   Injection Procedure Details:   Procedure diagnoses: Lumbar radiculopathy [M54.16]   Meds Administered:  Meds ordered this encounter  Medications   methylPREDNISolone  acetate (DEPO-MEDROL ) injection 40 mg     Laterality: Right  Location/Site:  L4-5  Needle: 3.5 in., 20 ga. Tuohy  Needle Placement: Paramedian epidural  Findings:   -Comments: Excellent flow of contrast into the epidural space.  Procedure Details: Using a paramedian approach from the side mentioned above, the region overlying the inferior lamina was localized under fluoroscopic visualization and the soft tissues overlying this structure were infiltrated with 4 ml. of 1% Lidocaine  without Epinephrine. The Tuohy needle was inserted into the epidural space using a paramedian approach.   The epidural space was localized using loss of resistance along with counter oblique bi-planar fluoroscopic views.  After negative aspirate for air, blood, and CSF, a 2 ml. volume of Isovue-250 was injected into the epidural space and the flow of contrast was observed. Radiographs were obtained for documentation purposes.    The injectate was administered into the level noted above.   Additional Comments:  The patient tolerated the procedure well Dressing: 2 x 2 sterile gauze and Band-Aid    Post-procedure details: Patient was observed during the procedure. Post-procedure instructions were reviewed.  Patient left the clinic in stable condition.

## 2023-11-09 NOTE — Progress Notes (Signed)
 Jillian Buchanan - 89 y.o. female MRN [id number]  Date of birth: 12/15/1932  Office Visit Note: Visit Date: 11/09/2023 PCP: Rexanne Ingle, MD Referred by: Rexanne Ingle, MD  Subjective: Chief Complaint  Patient presents with   Lower Back - Pain   HPI:  Jillian Buchanan is a 89 y.o. female who comes in today at the request of Duwaine Pouch, FNP for planned Right L4-5 Lumbar Interlaminar epidural steroid injection with fluoroscopic guidance.  The patient has failed conservative care including home exercise, medications, time and activity modification.  This injection will be diagnostic and hopefully therapeutic.  Please see requesting physician notes for further details and justification.   ROS Otherwise per HPI.  Assessment & Plan: Visit Diagnoses:    ICD-10-CM   1. Lumbar radiculopathy  M54.16 XR C-ARM NO REPORT    Epidural Steroid injection    methylPREDNISolone  acetate (DEPO-MEDROL ) injection 40 mg      Plan: No additional findings.   Meds & Orders:  Meds ordered this encounter  Medications   methylPREDNISolone  acetate (DEPO-MEDROL ) injection 40 mg    Orders Placed This Encounter  Procedures   XR C-ARM NO REPORT   Epidural Steroid injection    Follow-up: Return for visit to requesting provider as needed.   Procedures: No procedures performed  Lumbar Epidural Steroid Injection - Interlaminar Approach with Fluoroscopic Guidance  Patient: Jillian Buchanan      Date of Birth: 1932-12-26 MRN: [id number] PCP: Rexanne Ingle, MD      Visit Date: 11/09/2023   Universal Protocol:     Consent Given By: the patient  Position: PRONE  Additional Comments: Vital signs were monitored before and after the procedure. Patient was prepped and draped in the usual sterile fashion. The correct patient, procedure, and site was verified.   Injection Procedure Details:   Procedure diagnoses: Lumbar radiculopathy [M54.16]   Meds Administered:  Meds ordered this encounter   Medications   methylPREDNISolone  acetate (DEPO-MEDROL ) injection 40 mg     Laterality: Right  Location/Site:  L4-5  Needle: 3.5 in., 20 ga. Tuohy  Needle Placement: Paramedian epidural  Findings:   -Comments: Excellent flow of contrast into the epidural space.  Procedure Details: Using a paramedian approach from the side mentioned above, the region overlying the inferior lamina was localized under fluoroscopic visualization and the soft tissues overlying this structure were infiltrated with 4 ml. of 1% Lidocaine  without Epinephrine. The Tuohy needle was inserted into the epidural space using a paramedian approach.   The epidural space was localized using loss of resistance along with counter oblique bi-planar fluoroscopic views.  After negative aspirate for air, blood, and CSF, a 2 ml. volume of Isovue-250 was injected into the epidural space and the flow of contrast was observed. Radiographs were obtained for documentation purposes.    The injectate was administered into the level noted above.   Additional Comments:  The patient tolerated the procedure well Dressing: 2 x 2 sterile gauze and Band-Aid    Post-procedure details: Patient was observed during the procedure. Post-procedure instructions were reviewed.  Patient left the clinic in stable condition.   Clinical History: CLINICAL DATA:  Lumbar radiculopathy with symptoms persisting for 6 weeks in treatment   EXAM: MRI LUMBAR SPINE WITHOUT CONTRAST   TECHNIQUE: Multiplanar, multisequence MR imaging of the lumbar spine was performed. No intravenous contrast was administered.   COMPARISON:  None Available.   FINDINGS: Segmentation:  Standard.   Alignment:  Degenerative anterolisthesis at L3-4 and below  Vertebrae:  No fracture, evidence of discitis, or bone lesion.   Conus medullaris and cauda equina: Conus extends to the L1-2 level. Conus and cauda equina appear normal.   Paraspinal and other soft  tissues: Distal colonic diverticulosis. No perispinal mass or inflammation   Disc levels:   T12- L1: Mild degenerative facet spurring.   L1-L2: Degenerative facet spurring with mild disc height loss and circumferential bulging.   L2-L3: Mild disc bulging. Degenerative facet spurring on both sides.   L3-L4: Moderate degenerative facet spurring with mild anterolisthesis. The disc is desiccated and narrowed with bulging and small right foraminal protrusion. Mild triangular narrowing of the thecal sac. Noncompressive right foraminal narrowing   L4-L5: Degenerative facet spurring which is bulky with anterolisthesis. Disc is narrowed and bulging with bilateral paracentral protrusion. Narrowing of the subarticular recesses, greater on the left but not clearly compressive. Mild bilateral foraminal narrowing   L5-S1:Degenerative facet spurring which is fairly bulky with mild anterolisthesis. No herniation or impingement.   IMPRESSION: 1. Generalized lumbar spine degeneration especially affecting facets with mild anterolisthesis at L3-4 and below. 2. Noncompressive foraminal narrowing on the right at L3-4 and bilaterally at L4-5. 3. Moderate left subarticular recess narrowing at L4-5.     Electronically Signed   By: Dorn Roulette M.D.   On: 10/15/2023 19:23     Objective:  VS:  HT:    WT:   BMI:     BP:(!) 149/81  HR: bpm  TEMP: ( )  RESP:  Physical Exam Vitals and nursing note reviewed.  Constitutional:      General: She is not in acute distress.    Appearance: Normal appearance. She is not ill-appearing.  HENT:     Head: Normocephalic and atraumatic.     Right Ear: External ear normal.     Left Ear: External ear normal.  Eyes:     Extraocular Movements: Extraocular movements intact.  Cardiovascular:     Rate and Rhythm: Normal rate.     Pulses: Normal pulses.  Pulmonary:     Effort: Pulmonary effort is normal. No respiratory distress.  Abdominal:     General:  There is no distension.     Palpations: Abdomen is soft.  Musculoskeletal:        General: Tenderness present.     Cervical back: Neck supple.     Right lower leg: No edema.     Left lower leg: No edema.     Comments: Patient has good distal strength with no pain over the greater trochanters.  No clonus or focal weakness.  Skin:    Findings: No erythema, lesion or rash.  Neurological:     General: No focal deficit present.     Mental Status: She is alert and oriented to person, place, and time.     Sensory: No sensory deficit.     Motor: No weakness or abnormal muscle tone.     Coordination: Coordination normal.  Psychiatric:        Mood and Affect: Mood normal.        Behavior: Behavior normal.      Imaging: XR C-ARM NO REPORT Result Date: 11/09/2023 Please see Notes tab for imaging impression.

## 2024-01-03 ENCOUNTER — Ambulatory Visit: Admitting: Physician Assistant

## 2024-01-05 ENCOUNTER — Encounter: Payer: Self-pay | Admitting: Physician Assistant

## 2024-01-05 ENCOUNTER — Ambulatory Visit (INDEPENDENT_AMBULATORY_CARE_PROVIDER_SITE_OTHER): Admitting: Physician Assistant

## 2024-01-05 DIAGNOSIS — M25562 Pain in left knee: Secondary | ICD-10-CM

## 2024-01-05 DIAGNOSIS — M25561 Pain in right knee: Secondary | ICD-10-CM

## 2024-01-05 DIAGNOSIS — R200 Anesthesia of skin: Secondary | ICD-10-CM

## 2024-01-05 DIAGNOSIS — R202 Paresthesia of skin: Secondary | ICD-10-CM | POA: Diagnosis not present

## 2024-01-05 DIAGNOSIS — G8929 Other chronic pain: Secondary | ICD-10-CM | POA: Diagnosis not present

## 2024-01-05 NOTE — Progress Notes (Signed)
 Office Visit Note   Patient: Jillian Buchanan           Date of Birth: [birth year]           MRN: [id number] Visit Date: 01/05/2024              Requested by: Renford Dills, MD [address] [place],  [place] [address] PCP: Renford Dills, MD   Assessment & Plan: Visit Diagnoses:  1. Chronic pain of both knees   2. Numbness and tingling of foot     Plan: Patient with a long history of bilateral knee pain.  Has a history of osteoarthritis of her knees.  She comes in today complaining of some knee pain though not significant.  She has had a history of gel injections in 2022.  She biggest complaint is numbing and tingling on the bottom of her feet.  This is becoming difficult for her to sleep.  It has been getting worse the last 2 weeks.  Although she has had it in the past.  With regards to her knees we will go forward with some physical therapy she does not really want to do any injections today.  As far as her numbness on the bottom of her feet could be from her back or a peripheral neuropathy we will have this evaluated by lower extremity nerve studies.  Will call her with the results  Follow-Up Instructions: Will call with results  Orders:  Orders Placed This Encounter  Procedures   Ambulatory referral to Physical Therapy   NCV with EMG(electromyography)   No orders of the defined types were placed in this encounter.     Procedures: No procedures performed   Clinical Data: No additional findings.   Subjective: No chief complaint on file.   HPI pleasant 89 year old woman comes in today with check on her bilateral knees no new injuries no fever chills or just aching a little bit.  She has had gel injections last in 2022 with Dr. Cleophas Dunker.  Also comes in today with new onset tingling on the bottom of both of her feet.  She does have a history of radiculopathy and has been getting injections with Dr. Alvester Morin.  Also has a questionable whether this is been here for  a while and just gotten worse.  It keeps her up at night.  No changes in bowel or bladder control  Review of Systems  All other systems reviewed and are negative.    Objective: Vital Signs: There were no vitals taken for this visit.  Physical Exam Constitutional:      Appearance: Normal appearance.  Pulmonary:     Effort: Pulmonary effort is normal.  Skin:    General: Skin is warm and dry.  Neurological:     General: No focal deficit present.     Mental Status: She is alert and oriented to person, place, and time.  Psychiatric:        Mood and Affect: Mood normal.        Behavior: Behavior normal.     Ortho Exam Examination her knees have good range of motion no effusion no erythema minimally tender to palpation Examination cannot reproduce radicular findings she has altered sensation on the bottom of her feet no changes in strength.  No tenderness in her back Specialty Comments:  CLINICAL DATA:  Lumbar radiculopathy with symptoms persisting for 6 weeks in treatment   EXAM: MRI LUMBAR SPINE WITHOUT CONTRAST   TECHNIQUE: Multiplanar, multisequence  MR imaging of the lumbar spine was performed. No intravenous contrast was administered.   COMPARISON:  None Available.   FINDINGS: Segmentation:  Standard.   Alignment:  Degenerative anterolisthesis at L3-4 and below   Vertebrae:  No fracture, evidence of discitis, or bone lesion.   Conus medullaris and cauda equina: Conus extends to the L1-2 level. Conus and cauda equina appear normal.   Paraspinal and other soft tissues: Distal colonic diverticulosis. No perispinal mass or inflammation   Disc levels:   T12- L1: Mild degenerative facet spurring.   L1-L2: Degenerative facet spurring with mild disc height loss and circumferential bulging.   L2-L3: Mild disc bulging. Degenerative facet spurring on both sides.   L3-L4: Moderate degenerative facet spurring with mild anterolisthesis. The disc is desiccated and  narrowed with bulging and small right foraminal protrusion. Mild triangular narrowing of the thecal sac. Noncompressive right foraminal narrowing   L4-L5: Degenerative facet spurring which is bulky with anterolisthesis. Disc is narrowed and bulging with bilateral paracentral protrusion. Narrowing of the subarticular recesses, greater on the left but not clearly compressive. Mild bilateral foraminal narrowing   L5-S1:Degenerative facet spurring which is fairly bulky with mild anterolisthesis. No herniation or impingement.   IMPRESSION: 1. Generalized lumbar spine degeneration especially affecting facets with mild anterolisthesis at L3-4 and below. 2. Noncompressive foraminal narrowing on the right at L3-4 and bilaterally at L4-5. 3. Moderate left subarticular recess narrowing at L4-5.     Electronically Signed   By: Tiburcio Pea M.D.   On: 10/15/2023 19:23  Imaging: No results found.   PMFS History: Patient Active Problem List   Diagnosis Date Noted   Low back pain 09/02/2021   Trigger finger, right middle finger 08/24/2021   Genetic testing 06/18/2021   Family history of breast cancer 05/26/2021   Family history of bone cancer 05/26/2021   Malignant neoplasm of upper-inner quadrant of left breast in female, estrogen receptor positive (HCC) 05/24/2021   Bilateral primary osteoarthritis of knee 11/12/2019   Obesity 11/12/2019   Chronic right shoulder pain 11/27/2017   Unilateral primary osteoarthritis, left knee 11/27/2017   Past Medical History:  Diagnosis Date   Anxiety    self reported   Breast cancer (HCC)    Cataract    Family history of bone cancer    Family history of breast cancer    Hypertension     Family History  Problem Relation Age of Onset   Breast cancer Maternal Aunt        x5 aunts, dx >50   Breast cancer Paternal Aunt        dx >50   Breast cancer Cousin        x4 maternal first cousins   Bone cancer Daughter 54    Past Surgical  History:  Procedure Laterality Date   ABDOMINAL HYSTERECTOMY     BREAST LUMPECTOMY  1988   BREAST LUMPECTOMY WITH RADIOACTIVE SEED LOCALIZATION Left 06/15/2021   Procedure: LEFT BREAST LUMPECTOMY WITH RADIOACTIVE SEED LOCALIZATION;  Surgeon: Emelia Loron, MD;  Location: [facility];  Service: General;  Laterality: Left;   INTRAOCULAR PROSTHESES INSERTION  8/248/2005   Social History   Occupational History   Not on file  Tobacco Use   Smoking status: Never   Smokeless tobacco: Never  Vaping Use   Vaping status: Never Used  Substance and Sexual Activity   Alcohol use: Yes    Comment: rarely, social   Drug use: No   Sexual activity: Never

## 2024-01-18 ENCOUNTER — Encounter: Payer: Self-pay | Admitting: Rehabilitative and Restorative Service Providers"

## 2024-01-18 ENCOUNTER — Ambulatory Visit (INDEPENDENT_AMBULATORY_CARE_PROVIDER_SITE_OTHER): Admitting: Rehabilitative and Restorative Service Providers"

## 2024-01-18 DIAGNOSIS — M5459 Other low back pain: Secondary | ICD-10-CM

## 2024-01-18 DIAGNOSIS — R2689 Other abnormalities of gait and mobility: Secondary | ICD-10-CM | POA: Diagnosis not present

## 2024-01-18 DIAGNOSIS — M25561 Pain in right knee: Secondary | ICD-10-CM | POA: Diagnosis not present

## 2024-01-18 DIAGNOSIS — M25562 Pain in left knee: Secondary | ICD-10-CM | POA: Diagnosis not present

## 2024-01-18 DIAGNOSIS — G8929 Other chronic pain: Secondary | ICD-10-CM | POA: Diagnosis not present

## 2024-01-18 DIAGNOSIS — M6281 Muscle weakness (generalized): Secondary | ICD-10-CM

## 2024-01-18 NOTE — Therapy (Cosign Needed Addendum)
 OUTPATIENT PHYSICAL THERAPY LOWER EXTREMITY EVALUATION   Patient Name: Jillian Buchanan MRN: [id number] DOB:[birth year], 89 y.o., female Today's Date: 01/18/2024  END OF SESSION:  PT End of Session - 01/18/24 1604     Visit Number 1    Number of Visits 16    Authorization Type [employer]    Progress Note Due on Visit 10    PT Start Time 1515    PT Stop Time 1558    PT Time Calculation (min) 43 min    Activity Tolerance Patient tolerated treatment well;No increased pain    Behavior During Therapy WFL for tasks assessed/performed             Past Medical History:  Diagnosis Date   Anxiety    self reported   Breast cancer (HCC)    Cataract    Family history of bone cancer    Family history of breast cancer    Hypertension    Past Surgical History:  Procedure Laterality Date   ABDOMINAL HYSTERECTOMY     BREAST LUMPECTOMY  1988   BREAST LUMPECTOMY WITH RADIOACTIVE SEED LOCALIZATION Left 06/15/2021   Procedure: LEFT BREAST LUMPECTOMY WITH RADIOACTIVE SEED LOCALIZATION;  Surgeon: Emelia Loron, MD;  Location: [facility];  Service: General;  Laterality: Left;   INTRAOCULAR PROSTHESES INSERTION  8/248/2005   Patient Active Problem List   Diagnosis Date Noted   Low back pain 09/02/2021   Trigger finger, right middle finger 08/24/2021   Genetic testing 06/18/2021   Family history of breast cancer 05/26/2021   Family history of bone cancer 05/26/2021   Malignant neoplasm of upper-inner quadrant of left breast in female, estrogen receptor positive (HCC) 05/24/2021   Bilateral primary osteoarthritis of knee 11/12/2019   Obesity 11/12/2019   Chronic right shoulder pain 11/27/2017   Unilateral primary osteoarthritis, left knee 11/27/2017    PCP: Renford Dills, MD   REFERRING PROVIDER: Persons, [place], [place]   REFERRING DIAG:  M25.561,M25.562,G89.29 (ICD-10-CM) - Chronic pain of both knees   THERAPY DIAG:  Other low back pain  Chronic pain  of both knees  Muscle weakness (generalized)  Other abnormalities of gait and mobility  Rationale for Evaluation and Treatment: Rehabilitation  ONSET DATE: chronic- years  SUBJECTIVE:   SUBJECTIVE STATEMENT: States that her knees hurt but the main focus is on her back now since it is bothering her most. Has had back pain that pulls down her Lt leg when she is standing. States that she has at times had to Has done PT in the past and thinks it helped, has also done aquatic treadmill visits. Is interested in doing aquatic physical therapy.     Received epidural injection from Dr. Alvester Morin 11/09/2023 at L4/L5  PERTINENT HISTORY: OA bilateral knees, lumbar radiculopathy, HTN, anxiety   PAIN:  NPRS scale: 0/10 currently, 9/10 at worst Pain location: low back down to toes on the L side only Pain description: throbbing and pulling , heavy pressure Aggravating factors: standing up  Relieving factors: laying down, sitting  PRECAUTIONS: Other: no heavy lifting  WEIGHT BEARING RESTRICTIONS: No  FALLS:  Has patient fallen in last 6 months? No  LIVING ENVIRONMENT: Lives with: lives with their spouse Lives in: House/apartment Stairs: Yes: Internal: 14 steps; on right going up and External: 4 steps; can reach both Has following equipment at home: Single point cane  OCCUPATION: retired- Research scientist (medical)  PLOF: Independent  PATIENT GOALS: get more strength in legs and relieve back pain  Next MD visit: not scheduled at time of eval   OBJECTIVE:   DIAGNOSTIC FINDINGS:  MRI 10/2023:  Recent lumbar MRI imaging shows multi level lumbar spine degeneration, degenerative anterolisthesis at L3-L4 and below, there is non compressive foraminal stenosis on the right at L3-L4 and bilaterally at L4-L5. Bilateral subarticular narrowing at L4-L5. No high grade spinal canal stenosis noted.  PATIENT SURVEYS:  Patient-Specific Activity Scoring Scheme  "0" represents "unable to perform." "10"  represents "able to perform at prior level. 0 1 2 3 4 5 6 7 8 9  10 (Date and Score)   Activity Eval     1. Keeping balanced 1    2. standing 3    3. Getting in/out of car 1   4.    5.    Score 1.66/10    Total score = sum of the activity scores/number of activities Minimum detectable change (90%CI) for average score = 2 points Minimum detectable change (90%CI) for single activity score = 3 points  COGNITION: Overall cognitive status: WFL    SENSATION: WFL  EDEMA:  none  MUSCLE LENGTH: Not tested  POSTURE:  rounded shoulders and flexed trunk when standing upright   PALPATION: Tender to gluteal area bilaterally  LUMBAR ROM:   Directional Preference Assessment: Centralization: Peripheralization:   AROM eval  Flexion Touches low shin  Extension 25%  Right lateral flexion   Left lateral flexion   Right rotation 50%  Left rotation 50%   (Blank rows = not tested)  LOWER EXTREMITY ROM:      Right eval Left eval  Hip flexion    Hip extension    Hip abduction    Hip adduction    Hip internal rotation    Hip external rotation    Knee flexion    Knee extension    Ankle dorsiflexion    Ankle plantarflexion    Ankle inversion    Ankle eversion     (Blank rows = not tested)  LOWER EXTREMITY MMT:    MMT Right eval Left eval  Hip flexion 4- 3+  Hip extension    Hip abduction  3  Hip adduction    Hip internal rotation    Hip external rotation    Knee flexion 5 5  Knee extension 5 4-  Ankle dorsiflexion 5 5  Ankle plantarflexion    Ankle inversion    Ankle eversion     (Blank rows = not tested) LOWER EXTREMITY SPECIAL TESTS:  None performed   FUNCTIONAL TESTS:  Not tested at eval  GAIT: Distance walked: clinical distances Assistive device utilized: Single point cane Level of assistance: SBA Comments: decreased cadence, flexed trunk, wide step length  TODAY'S TREATMENT                                                                          DATE: 01/18/2024 Therex:    HEP instruction/performance c cues for techniques, handout provided.  Trial set performed of each for comprehension and symptom assessment.  See below for exercise list  Manual: Percussive device to bilateral glute and lower lumbar musculature to patient tolerance  PATIENT EDUCATION:  Education details: HEP, POC Person educated: Patient Education method: Explanation, Demonstration, Verbal cues, and Handouts Education comprehension: verbalized understanding, returned demonstration, and verbal cues required  HOME EXERCISE PROGRAM: Access Code: [email or web address]  URL: [email or web address] Date: 01/18/2024 Prepared by: Chyrel Masson   Exercises - Supine Lower Trunk Rotation  - 1-2 x daily - 7 x weekly - 1 sets - 3-5 reps - 15 hold - Supine Bridge  - 1-2 x daily - 7 x weekly - 1-2 sets - 10 reps - 2 hold - Clamshell  - 1-2 x daily - 7 x weekly - 2-3 sets - 10 reps - Supine Quadricep Sets  - 1-2 x daily - 7 x weekly - 1 sets - 10 reps - 5 hold - Small Range Straight Leg Raise  - 1 x daily - 7 x weekly - 1-2 sets - 10 reps    ASSESSMENT:  CLINICAL IMPRESSION: Patient is a 89 y.o. who comes to clinic with complaints of bilateral knee pain with mobility, strength and movement coordination deficits that impair their ability to perform usual daily and recreational functional activities without increase difficulty/symptoms at this time.  Patient to benefit from skilled PT services to address impairments and limitations to improve to previous level of function without restriction secondary to condition. Tolerated treatment well and will benefit from aquatic therapy as well to offload joints and spine in effort to reduce pain.  OBJECTIVE IMPAIRMENTS: Abnormal gait, decreased balance, decreased  endurance, decreased knowledge of condition, decreased mobility, difficulty walking, decreased strength, impaired perceived functional ability, and pain.   ACTIVITY LIMITATIONS: carrying, lifting, bending, standing, squatting, stairs, and locomotion level  PARTICIPATION LIMITATIONS: meal prep, cleaning, laundry, shopping, and community activity  PERSONAL FACTORS: Age, Fitness, Time since onset of injury/illness/exacerbation, and 3+ comorbidities: see above  are also affecting patient's functional outcome.   REHAB POTENTIAL: Fair due to time since onset with chronic pain  CLINICAL DECISION MAKING: Stable/uncomplicated  EVALUATION COMPLEXITY: Low   GOALS: Goals reviewed with patient? Yes  SHORT TERM GOALS: (target date for Short term goals are 3 weeks 02/08/2024)   1.  Patient will demonstrate independent use of home exercise program to maintain progress from in clinic treatments.  Goal status: New  LONG TERM GOALS: (target dates for all long term goals are 6 weeks  02/29/2024 )   1. Patient will demonstrate/report pain at worst less than or equal to 5/10 to facilitate minimal limitation in daily activity secondary to pain symptoms.  Goal status: New   2. Patient will demonstrate independent use of home exercise program to facilitate ability to maintain/progress functional gains from skilled physical therapy services.  Goal status: New   3. Patient will demonstrate Patient specific functional scale avg > or = 5 to indicate reduced disability due  to condition.   Goal status: New   4.  Patient will demonstrate BLE MMT 5/5 throughout to faciltiate usual transfers, stairs, squatting at [facility] for daily life.   Goal status: New   5. Patient will demonstrate up and down a flight of stairs with single hand rail with reciprocal gait pattern.   Goal status: New   6.  Patient will demonstrate lumbar extension >/=75% without symptoms to facilitate upright standing, walking posture at PLOF  s limitation.  Goal Status: New   PLAN:  PT FREQUENCY: 1-2x/week  PT DURATION: 6 weeks  PLANNED INTERVENTIONS: Can include 78469- PT Re-evaluation, 97110-Therapeutic exercises, 97530- Therapeutic activity, 97112- Neuromuscular re-education, [phone]- Self Care, 97140- Manual therapy, [phone]- Gait training, [phone]- Orthotic Fit/training, [phone]- Canalith repositioning, [id number]- Aquatic Therapy, [phone]- Electrical stimulation (unattended), [phone]- Electrical stimulation (manual), [id number] Physical performance testing, 97016- Vasopneumatic device, [id number]- Ultrasound, [id number]- Traction (mechanical), [id number]- Ionotophoresis 4mg /ml Dexamethasone, Patient/Family education, Balance training, Stair training, Taping, Dry Needling, Joint mobilization, Joint manipulation, Spinal manipulation, Spinal mobilization, Scar mobilization, Vestibular training, Visual/preceptual remediation/compensation, DME instructions, Cryotherapy, and Moist heat.  All performed as medically necessary.  All included unless contraindicated  PLAN FOR NEXT SESSION: Review HEP knowledge/results.    Sagar Tengan, Ples Trudel, Student-PT 01/18/2024, 4:11 PM

## 2024-01-19 ENCOUNTER — Encounter: Payer: Self-pay | Admitting: Rehabilitative and Restorative Service Providers"

## 2024-01-23 ENCOUNTER — Ambulatory Visit (INDEPENDENT_AMBULATORY_CARE_PROVIDER_SITE_OTHER): Admitting: Rehabilitative and Restorative Service Providers"

## 2024-01-23 ENCOUNTER — Encounter: Payer: Self-pay | Admitting: Rehabilitative and Restorative Service Providers"

## 2024-01-23 DIAGNOSIS — M5459 Other low back pain: Secondary | ICD-10-CM

## 2024-01-23 DIAGNOSIS — M25562 Pain in left knee: Secondary | ICD-10-CM

## 2024-01-23 DIAGNOSIS — M25561 Pain in right knee: Secondary | ICD-10-CM

## 2024-01-23 DIAGNOSIS — G8929 Other chronic pain: Secondary | ICD-10-CM | POA: Diagnosis not present

## 2024-01-23 DIAGNOSIS — M6281 Muscle weakness (generalized): Secondary | ICD-10-CM

## 2024-01-23 DIAGNOSIS — R2689 Other abnormalities of gait and mobility: Secondary | ICD-10-CM

## 2024-01-23 NOTE — Therapy (Signed)
 OUTPATIENT PHYSICAL THERAPY TREATMENT   Patient Name: Jillian Buchanan MRN: [id number] DOB:[birth year], 89 y.o., female Today's Date: 01/23/2024  END OF SESSION:  PT End of Session - 01/23/24 1302     Visit Number 2    Number of Visits 16    Date for PT Re-Evaluation 03/29/24    Authorization Type [employer]    Progress Note Due on Visit 10    PT Start Time 1258    PT Stop Time 1340    PT Time Calculation (min) 42 min    Activity Tolerance Patient tolerated treatment well    Behavior During Therapy WFL for tasks assessed/performed              Past Medical History:  Diagnosis Date   Anxiety    self reported   Breast cancer (HCC)    Cataract    Family history of bone cancer    Family history of breast cancer    Hypertension    Past Surgical History:  Procedure Laterality Date   ABDOMINAL HYSTERECTOMY     BREAST LUMPECTOMY  1988   BREAST LUMPECTOMY WITH RADIOACTIVE SEED LOCALIZATION Left 06/15/2021   Procedure: LEFT BREAST LUMPECTOMY WITH RADIOACTIVE SEED LOCALIZATION;  Surgeon: Emelia Loron, MD;  Location: [facility];  Service: General;  Laterality: Left;   INTRAOCULAR PROSTHESES INSERTION  8/248/2005   Patient Active Problem List   Diagnosis Date Noted   Low back pain 09/02/2021   Trigger finger, right middle finger 08/24/2021   Genetic testing 06/18/2021   Family history of breast cancer 05/26/2021   Family history of bone cancer 05/26/2021   Malignant neoplasm of upper-inner quadrant of left breast in female, estrogen receptor positive (HCC) 05/24/2021   Bilateral primary osteoarthritis of knee 11/12/2019   Obesity 11/12/2019   Chronic right shoulder pain 11/27/2017   Unilateral primary osteoarthritis, left knee 11/27/2017    PCP: Renford Dills, MD   REFERRING PROVIDER: Persons, [place], [place]   REFERRING DIAG:  M25.561,M25.562,G89.29 (ICD-10-CM) - Chronic pain of both knees   THERAPY DIAG:  Other low back pain  Chronic  pain of both knees  Muscle weakness (generalized)  Other abnormalities of gait and mobility  Rationale for Evaluation and Treatment: Rehabilitation  ONSET DATE: chronic- years  SUBJECTIVE:   SUBJECTIVE STATEMENT: Pt indicated feeling "fine" upon arrival today.  Thought massage gun helped from last visit.  Pt indicated Lt great toe throbbing at night off and on in last 3 nights.   PERTINENT HISTORY: OA bilateral knees, lumbar radiculopathy, HTN, anxiety   PAIN:  NPRS scale: at worst last few days:  6/10 Pain location: low back down to toes on the L side only Pain description: throbbing and pulling , heavy pressure Aggravating factors: standing up  Relieving factors: laying down, sitting  PRECAUTIONS: Other: no heavy lifting  WEIGHT BEARING RESTRICTIONS: No  FALLS:  Has patient fallen in last 6 months? No  LIVING ENVIRONMENT: Lives with: lives with their spouse Lives in: House/apartment Stairs: Yes: Internal: 14 steps; on right going up and External: 4 steps; can reach both Has following equipment at home: Single point cane  OCCUPATION: retired- Research scientist (medical)  PLOF: Independent  PATIENT GOALS: get more strength in legs and relieve back pain  OBJECTIVE:   DIAGNOSTIC FINDINGS:  MRI 10/2023:  Recent lumbar MRI imaging shows multi level lumbar spine degeneration, degenerative anterolisthesis at L3-L4 and below, there is non compressive foraminal stenosis on the right at L3-L4 and bilaterally  at L4-L5. Bilateral subarticular narrowing at L4-L5. No high grade spinal canal stenosis noted.  PATIENT SURVEYS:  Patient-Specific Activity Scoring Scheme  "0" represents "unable to perform." "10" represents "able to perform at prior level. 0 1 2 3 4 5 6 7 8 9  10 (Date and Score)   Activity Eval     1. Keeping balanced 1    2. standing 3    3. Getting in/out of car 1   4.    5.    Score 1.66/10    Total score = sum of the activity scores/number of  activities Minimum detectable change (90%CI) for average score = 2 points Minimum detectable change (90%CI) for single activity score = 3 points  COGNITION: 01/19/2024 Overall cognitive status: WFL    SENSATION: 01/19/2024 WFL  EDEMA:  01/19/2024 none  MUSCLE LENGTH: 01/19/2024 Not tested  POSTURE:  01/19/2024 rounded shoulders and flexed trunk when standing upright   PALPATION: 01/19/2024 Tender to gluteal area bilaterally  LUMBAR ROM:  01/19/2024 Directional Preference Assessment: Centralization: none observed Peripheralization: none observed  AROM 01/19/2024  Flexion Touches low shin  Extension 25%  Right lateral flexion   Left lateral flexion   Right rotation 50%  Left rotation 50%   (Blank rows = not tested)  LOWER EXTREMITY ROM:      Right 01/19/2024 Left 01/19/2024  Hip flexion    Hip extension    Hip abduction    Hip adduction    Hip internal rotation    Hip external rotation    Knee flexion    Knee extension    Ankle dorsiflexion    Ankle plantarflexion    Ankle inversion    Ankle eversion     (Blank rows = not tested)  LOWER EXTREMITY MMT:    MMT Right 01/19/2024 Left 01/19/2024  Hip flexion 4- 3+  Hip extension    Hip abduction  3  Hip adduction    Hip internal rotation    Hip external rotation    Knee flexion 5 5  Knee extension 5 4-  Ankle dorsiflexion 5 5  Ankle plantarflexion    Ankle inversion    Ankle eversion     (Blank rows = not tested)  LOWER EXTREMITY SPECIAL TESTS:  01/19/2024 None performed   FUNCTIONAL TESTS:  01/19/2024 Not tested at eval  GAIT: 01/19/2024 Distance walked: clinical distances Assistive device utilized: Single point cane Level of assistance: SBA Comments: decreased cadence, flexed trunk, wide step length                                                                                                                                                                        TODAY'S TREATMENT  DATE: 01/23/2024 Therex: Verbal review of existing HEP c cues. Supine lumbar trunk rotation 15 sec x 3 bilateral Supine bridge c tband around knees green 2 x 10  Supine hooklying clam shell green band x 20 bilateral c contralateral leg isometric hold  Nustep Lvl 6 UE/LE 8 mins for aerobic exercise introduction.  Discussed application of research stating benefits of aerobic exercise on chronic back pain symptoms.   Manual Percussive device to bilateral glute, lumbar lower     TODAY'S TREATMENT                                                                          DATE: 01/18/2024 Therex:    HEP instruction/performance c cues for techniques, handout provided.  Trial set performed of each for comprehension and symptom assessment.  See below for exercise list  Manual: Percussive device to bilateral glute and lower lumbar musculature to patient tolerance  PATIENT EDUCATION:  01/19/2024 Education details: HEP, POC Person educated: Patient Education method: Programmer, multimedia, Demonstration, Verbal cues, and Handouts Education comprehension: verbalized understanding, returned demonstration, and verbal cues required  HOME EXERCISE PROGRAM: Access Code: [email or web address]  URL: [email or web address] Date: 01/18/2024 Prepared by: Chyrel Masson   Exercises - Supine Lower Trunk Rotation  - 1-2 x daily - 7 x weekly - 1 sets - 3-5 reps - 15 hold - Supine Bridge  - 1-2 x daily - 7 x weekly - 1-2 sets - 10 reps - 2 hold - Clamshell  - 1-2 x daily - 7 x weekly - 2-3 sets - 10 reps - Supine Quadricep Sets  - 1-2 x daily - 7 x weekly - 1 sets - 10 reps - 5 hold - Small Range Straight Leg Raise  - 1 x daily - 7 x weekly - 1-2 sets - 10 reps    ASSESSMENT:  CLINICAL IMPRESSION: Good overall recall noted from HEP.  Benefits for lumbar symptoms noted from percussive device and general mobility.  Recommend continued skilled PT services  at this time to promote mobility, strength gains for symptom improvements in daily activity.   OBJECTIVE IMPAIRMENTS: Abnormal gait, decreased balance, decreased endurance, decreased knowledge of condition, decreased mobility, difficulty walking, decreased strength, impaired perceived functional ability, and pain.   ACTIVITY LIMITATIONS: carrying, lifting, bending, standing, squatting, stairs, and locomotion level  PARTICIPATION LIMITATIONS: meal prep, cleaning, laundry, shopping, and community activity  PERSONAL FACTORS: Age, Fitness, Time since onset of injury/illness/exacerbation, and 3+ comorbidities: see above  are also affecting patient's functional outcome.   REHAB POTENTIAL: Fair due to time since onset with chronic pain  CLINICAL DECISION MAKING: Evolving/moderate complexity  EVALUATION COMPLEXITY: Moderate   GOALS: Goals reviewed with patient? Yes  SHORT TERM GOALS: (target date for Short term goals are 3 weeks 02/08/2024)   1.  Patient will demonstrate independent use of home exercise program to maintain progress from in clinic treatments.  Goal status: on going 01/23/2024  LONG TERM GOALS: (target dates for all long term goals are 10 weeks  03/29/2024 )   1. Patient will demonstrate/report pain at worst less than or equal to 5/10 to facilitate minimal limitation in daily activity secondary to pain symptoms.  Goal status: New   2. Patient will demonstrate independent  use of home exercise program to facilitate ability to maintain/progress functional gains from skilled physical therapy services.  Goal status: New   3. Patient will demonstrate Patient specific functional scale avg > or = 7 to indicate reduced disability due to condition.   Goal status: New   4.  Patient will demonstrate BLE MMT 5/5 throughout to faciltiate usual transfers, stairs, squatting at [facility] for daily life.   Goal status: New   5. Patient will demonstrate up and down a flight of stairs with  single hand rail with reciprocal gait pattern.   Goal status: New   6.  Patient will demonstrate lumbar extension >/=75% without symptoms to facilitate upright standing, walking posture at PLOF s limitation.  Goal Status: New   PLAN:  PT FREQUENCY: 1-2x/week  PT DURATION: 10 weeks  PLANNED INTERVENTIONS: Can include 16109- PT Re-evaluation, 97110-Therapeutic exercises, 97530- Therapeutic activity, 97112- Neuromuscular re-education, [phone]- Self Care, 97140- Manual therapy, [phone]- Gait training, [phone]- Orthotic Fit/training, [phone]- Canalith repositioning, [id number]- Aquatic Therapy, [phone]- Electrical stimulation (unattended), [phone]- Electrical stimulation (manual), [id number] Physical performance testing, 97016- Vasopneumatic device, [id number]- Ultrasound, [id number]- Traction (mechanical), [id number]- Ionotophoresis 4mg /ml Dexamethasone, Patient/Family education, Balance training, Stair training, Taping, Dry Needling, Joint mobilization, Joint manipulation, Spinal manipulation, Spinal mobilization, Scar mobilization, Vestibular training, Visual/preceptual remediation/compensation, DME instructions, Cryotherapy, and Moist heat.  All performed as medically necessary.  All included unless contraindicated  PLAN FOR NEXT SESSION: Percussive device as desired.  Progressive mobility improvements, LE strengthening.    Chyrel Masson, PT, DPT, OCS, ATC 01/23/24  1:36 PM

## 2024-01-24 DIAGNOSIS — L720 Epidermal cyst: Secondary | ICD-10-CM | POA: Diagnosis not present

## 2024-01-24 DIAGNOSIS — R202 Paresthesia of skin: Secondary | ICD-10-CM | POA: Diagnosis not present

## 2024-01-24 DIAGNOSIS — L2089 Other atopic dermatitis: Secondary | ICD-10-CM | POA: Diagnosis not present

## 2024-01-24 DIAGNOSIS — L811 Chloasma: Secondary | ICD-10-CM | POA: Diagnosis not present

## 2024-01-24 DIAGNOSIS — L821 Other seborrheic keratosis: Secondary | ICD-10-CM | POA: Diagnosis not present

## 2024-01-24 DIAGNOSIS — M545 Low back pain, unspecified: Secondary | ICD-10-CM | POA: Diagnosis not present

## 2024-01-24 DIAGNOSIS — M79676 Pain in unspecified toe(s): Secondary | ICD-10-CM | POA: Diagnosis not present

## 2024-01-31 ENCOUNTER — Encounter: Admitting: Physical Therapy

## 2024-02-07 ENCOUNTER — Encounter: Payer: Self-pay | Admitting: Physical Therapy

## 2024-02-07 ENCOUNTER — Ambulatory Visit (INDEPENDENT_AMBULATORY_CARE_PROVIDER_SITE_OTHER): Admitting: Physical Therapy

## 2024-02-07 DIAGNOSIS — G8929 Other chronic pain: Secondary | ICD-10-CM

## 2024-02-07 DIAGNOSIS — M5459 Other low back pain: Secondary | ICD-10-CM

## 2024-02-07 DIAGNOSIS — M6281 Muscle weakness (generalized): Secondary | ICD-10-CM | POA: Diagnosis not present

## 2024-02-07 DIAGNOSIS — M25561 Pain in right knee: Secondary | ICD-10-CM | POA: Diagnosis not present

## 2024-02-07 DIAGNOSIS — R2689 Other abnormalities of gait and mobility: Secondary | ICD-10-CM

## 2024-02-07 DIAGNOSIS — M25562 Pain in left knee: Secondary | ICD-10-CM

## 2024-02-07 NOTE — Therapy (Signed)
 OUTPATIENT PHYSICAL THERAPY TREATMENT   Patient Name: Jillian Buchanan MRN: [id number] DOB:12-01-1932, 89 y.o., female Today's Date: 02/07/2024  END OF SESSION:  PT End of Session - 02/07/24 1350     Visit Number 3    Number of Visits 16    Date for PT Re-Evaluation 03/29/24    Authorization Type [employer]    Progress Note Due on Visit 10    PT Start Time 1345    PT Stop Time 1424    PT Time Calculation (min) 39 min    Activity Tolerance Patient tolerated treatment well    Behavior During Therapy WFL for tasks assessed/performed               Past Medical History:  Diagnosis Date   Anxiety    self reported   Breast cancer (HCC)    Cataract    Family history of bone cancer    Family history of breast cancer    Hypertension    Past Surgical History:  Procedure Laterality Date   ABDOMINAL HYSTERECTOMY     BREAST LUMPECTOMY  1988   BREAST LUMPECTOMY WITH RADIOACTIVE SEED LOCALIZATION Left 06/15/2021   Procedure: LEFT BREAST LUMPECTOMY WITH RADIOACTIVE SEED LOCALIZATION;  Surgeon: Emelia Loron, MD;  Location: [facility];  Service: General;  Laterality: Left;   INTRAOCULAR PROSTHESES INSERTION  8/248/2005   Patient Active Problem List   Diagnosis Date Noted   Low back pain 09/02/2021   Trigger finger, right middle finger 08/24/2021   Genetic testing 06/18/2021   Family history of breast cancer 05/26/2021   Family history of bone cancer 05/26/2021   Malignant neoplasm of upper-inner quadrant of left breast in female, estrogen receptor positive (HCC) 05/24/2021   Bilateral primary osteoarthritis of knee 11/12/2019   Obesity 11/12/2019   Chronic right shoulder pain 11/27/2017   Unilateral primary osteoarthritis, left knee 11/27/2017    PCP: Renford Dills, MD   REFERRING PROVIDER: Persons, [place], [place]   REFERRING DIAG:  M25.561,M25.562,G89.29 (ICD-10-CM) - Chronic pain of both knees   THERAPY DIAG:  Other low back pain  Chronic  pain of both knees  Muscle weakness (generalized)  Other abnormalities of gait and mobility  Rationale for Evaluation and Treatment: Rehabilitation  ONSET DATE: chronic- years  SUBJECTIVE:   SUBJECTIVE STATEMENT: Still having a pulling sensation in the groin when waking up in the morning; resolves after about 20-30 min.  Doing HEP every morngin.   PERTINENT HISTORY: OA bilateral knees, lumbar radiculopathy, HTN, anxiety   PAIN:  NPRS scale: at worst last few days:  0/10 Pain location: low back down to toes on the L side only Pain description: throbbing and pulling , heavy pressure Aggravating factors: standing up  Relieving factors: laying down, sitting  PRECAUTIONS: Other: no heavy lifting  WEIGHT BEARING RESTRICTIONS: No  FALLS:  Has patient fallen in last 6 months? No  LIVING ENVIRONMENT: Lives with: lives with their spouse Lives in: House/apartment Stairs: Yes: Internal: 14 steps; on right going up and External: 4 steps; can reach both Has following equipment at home: Single point cane  OCCUPATION: retired- Research scientist (medical)  PLOF: Independent  PATIENT GOALS: get more strength in legs and relieve back pain  OBJECTIVE:   DIAGNOSTIC FINDINGS:  MRI 10/2023:  Recent lumbar MRI imaging shows multi level lumbar spine degeneration, degenerative anterolisthesis at L3-L4 and below, there is non compressive foraminal stenosis on the right at L3-L4 and bilaterally at L4-L5. Bilateral subarticular narrowing at  L4-L5. No high grade spinal canal stenosis noted.  PATIENT SURVEYS:  Patient-Specific Activity Scoring Scheme  "0" represents "unable to perform." "10" represents "able to perform at prior level. 0 1 2 3 4 5 6 7 8 9  10 (Date and Score)   Activity Eval     1. Keeping balanced 1    2. standing 3    3. Getting in/out of car 1   Score 1.66/10    Total score = sum of the activity scores/number of activities Minimum detectable change (90%CI) for average score  = 2 points Minimum detectable change (90%CI) for single activity score = 3 points  COGNITION: 01/19/2024 Overall cognitive status: WFL    SENSATION: 01/19/2024 WFL  EDEMA:  01/19/2024 none  MUSCLE LENGTH: 01/19/2024 Not tested  POSTURE:  01/19/2024 rounded shoulders and flexed trunk when standing upright   PALPATION: 01/19/2024 Tender to gluteal area bilaterally  LUMBAR ROM:  01/19/2024 Directional Preference Assessment: Centralization: none observed Peripheralization: none observed  AROM 01/19/2024  Flexion Touches low shin  Extension 25%  Right lateral flexion   Left lateral flexion   Right rotation 50%  Left rotation 50%   (Blank rows = not tested)  LOWER EXTREMITY ROM:      Right 01/19/2024 Left 01/19/2024  Hip flexion    Hip extension    Hip abduction    Hip adduction    Hip internal rotation    Hip external rotation    Knee flexion    Knee extension    Ankle dorsiflexion    Ankle plantarflexion    Ankle inversion    Ankle eversion     (Blank rows = not tested)  LOWER EXTREMITY MMT:    MMT Right 01/19/2024 Left 01/19/2024  Hip flexion 4- 3+  Hip extension    Hip abduction  3  Hip adduction    Hip internal rotation    Hip external rotation    Knee flexion 5 5  Knee extension 5 4-  Ankle dorsiflexion 5 5  Ankle plantarflexion    Ankle inversion    Ankle eversion     (Blank rows = not tested)  LOWER EXTREMITY SPECIAL TESTS:  01/19/2024 None performed   FUNCTIONAL TESTS:  01/19/2024 Not tested at eval  GAIT: 01/19/2024 Distance walked: clinical distances Assistive device utilized: Single point cane Level of assistance: SBA Comments: decreased cadence, flexed trunk, wide step length                                                                                                                                                                        TODAY'S TREATMENT 02/07/24 TherEx NuStep L6 x 8 min UE/LE Single limb hooklying  clamshell 2x10 bil; L3 band Bridges with  isometric clam and L3 band 2x10 Single knee to chest 3x20 sec bil  TherAct Standing hip abduction 2x10 bil; L3 band Standing hip extension 2x10 bil; L3 band Calf raises 2x10; light UE support  Manual IASTM with percussive device to Lt posterior hip/glute    01/23/2024 Therex: Verbal review of existing HEP c cues. Supine lumbar trunk rotation 15 sec x 3 bilateral Supine bridge c tband around knees green 2 x 10  Supine hooklying clam shell green band x 20 bilateral c contralateral leg isometric hold  Nustep Lvl 6 UE/LE 8 mins for aerobic exercise introduction.  Discussed application of research stating benefits of aerobic exercise on chronic back pain symptoms.   Manual Percussive device to bilateral glute, lumbar lower     01/18/2024 Therex:    HEP instruction/performance c cues for techniques, handout provided.  Trial set performed of each for comprehension and symptom assessment.  See below for exercise list  Manual: Percussive device to bilateral glute and lower lumbar musculature to patient tolerance  PATIENT EDUCATION:  01/19/2024 Education details: HEP, POC Person educated: Patient Education method: Programmer, multimedia, Demonstration, Verbal cues, and Handouts Education comprehension: verbalized understanding, returned demonstration, and verbal cues required  HOME EXERCISE PROGRAM: Access Code: [email or web address]  URL: [email or web address] Date: 01/18/2024 Prepared by: Chyrel Masson   Exercises - Supine Lower Trunk Rotation  - 1-2 x daily - 7 x weekly - 1 sets - 3-5 reps - 15 hold - Supine Bridge  - 1-2 x daily - 7 x weekly - 1-2 sets - 10 reps - 2 hold - Clamshell  - 1-2 x daily - 7 x weekly - 2-3 sets - 10 reps - Supine Quadricep Sets  - 1-2 x daily - 7 x weekly - 1 sets - 10 reps - 5 hold - Small Range Straight Leg Raise  - 1 x daily - 7 x weekly - 1-2 sets - 10 reps    ASSESSMENT:  CLINICAL IMPRESSION: Pt tolerated  session well today working on strengthening and functional activities.  Will continue to benefit from PT to maximize function.  OBJECTIVE IMPAIRMENTS: Abnormal gait, decreased balance, decreased endurance, decreased knowledge of condition, decreased mobility, difficulty walking, decreased strength, impaired perceived functional ability, and pain.   ACTIVITY LIMITATIONS: carrying, lifting, bending, standing, squatting, stairs, and locomotion level  PARTICIPATION LIMITATIONS: meal prep, cleaning, laundry, shopping, and community activity  PERSONAL FACTORS: Age, Fitness, Time since onset of injury/illness/exacerbation, and 3+ comorbidities: see above  are also affecting patient's functional outcome.   REHAB POTENTIAL: Fair due to time since onset with chronic pain  CLINICAL DECISION MAKING: Evolving/moderate complexity  EVALUATION COMPLEXITY: Moderate   GOALS: Goals reviewed with patient? Yes  SHORT TERM GOALS: (target date for Short term goals are 3 weeks 02/08/2024)   1.  Patient will demonstrate independent use of home exercise program to maintain progress from in clinic treatments.  Goal status: on going 01/23/2024  LONG TERM GOALS: (target dates for all long term goals are 10 weeks  03/29/2024 )   1. Patient will demonstrate/report pain at worst less than or equal to 5/10 to facilitate minimal limitation in daily activity secondary to pain symptoms.  Goal status: New   2. Patient will demonstrate independent use of home exercise program to facilitate ability to maintain/progress functional gains from skilled physical therapy services.  Goal status: New   3. Patient will demonstrate Patient specific functional scale avg > or = 7 to indicate reduced disability due to condition.   Goal status:  New   4.  Patient will demonstrate BLE MMT 5/5 throughout to faciltiate usual transfers, stairs, squatting at [facility] for daily life.   Goal status: New   5. Patient will demonstrate up and  down a flight of stairs with single hand rail with reciprocal gait pattern.   Goal status: New   6.  Patient will demonstrate lumbar extension >/=75% without symptoms to facilitate upright standing, walking posture at PLOF s limitation.  Goal Status: New   PLAN:  PT FREQUENCY: 1-2x/week  PT DURATION: 10 weeks  PLANNED INTERVENTIONS: Can include 28413- PT Re-evaluation, 97110-Therapeutic exercises, 97530- Therapeutic activity, 97112- Neuromuscular re-education, 97535- Self Care, 97140- Manual therapy, [phone]- Gait training, [phone]- Orthotic Fit/training, [phone]- Canalith repositioning, [id number]- Aquatic Therapy, [phone]- Electrical stimulation (unattended), [phone]- Electrical stimulation (manual), [id number] Physical performance testing, 97016- Vasopneumatic device, [id number]- Ultrasound, [id number]- Traction (mechanical), [id number]- Ionotophoresis 4mg /ml Dexamethasone, Patient/Family education, Balance training, Stair training, Taping, Dry Needling, Joint mobilization, Joint manipulation, Spinal manipulation, Spinal mobilization, Scar mobilization, Vestibular training, Visual/preceptual remediation/compensation, DME instructions, Cryotherapy, and Moist heat.  All performed as medically necessary.  All included unless contraindicated  PLAN FOR NEXT SESSION: check STG,  Percussive device as desired.  Progressive mobility improvements, LE strengthening.    Clarita Crane, PT, DPT 02/07/24 2:27 PM

## 2024-02-12 ENCOUNTER — Ambulatory Visit (INDEPENDENT_AMBULATORY_CARE_PROVIDER_SITE_OTHER): Admitting: Rehabilitative and Restorative Service Providers"

## 2024-02-12 ENCOUNTER — Encounter: Payer: Self-pay | Admitting: Rehabilitative and Restorative Service Providers"

## 2024-02-12 DIAGNOSIS — M25562 Pain in left knee: Secondary | ICD-10-CM

## 2024-02-12 DIAGNOSIS — M6281 Muscle weakness (generalized): Secondary | ICD-10-CM | POA: Diagnosis not present

## 2024-02-12 DIAGNOSIS — M5459 Other low back pain: Secondary | ICD-10-CM

## 2024-02-12 DIAGNOSIS — M25561 Pain in right knee: Secondary | ICD-10-CM

## 2024-02-12 DIAGNOSIS — R2689 Other abnormalities of gait and mobility: Secondary | ICD-10-CM | POA: Diagnosis not present

## 2024-02-12 DIAGNOSIS — G8929 Other chronic pain: Secondary | ICD-10-CM | POA: Diagnosis not present

## 2024-02-12 NOTE — Therapy (Signed)
 OUTPATIENT PHYSICAL THERAPY TREATMENT   Patient Name: Jillian Buchanan MRN: [id number] DOB:[birth year], 89 y.o., female Today's Date: 02/12/2024  END OF SESSION:  PT End of Session - 02/12/24 1443     Visit Number 4    Number of Visits 16    Date for PT Re-Evaluation 03/29/24    Authorization Type [employer]    Progress Note Due on Visit 10    PT Start Time 1436    PT Stop Time 1515    PT Time Calculation (min) 39 min    Activity Tolerance Patient tolerated treatment well    Behavior During Therapy WFL for tasks assessed/performed                Past Medical History:  Diagnosis Date   Anxiety    self reported   Breast cancer (HCC)    Cataract    Family history of bone cancer    Family history of breast cancer    Hypertension    Past Surgical History:  Procedure Laterality Date   ABDOMINAL HYSTERECTOMY     BREAST LUMPECTOMY  1988   BREAST LUMPECTOMY WITH RADIOACTIVE SEED LOCALIZATION Left 06/15/2021   Procedure: LEFT BREAST LUMPECTOMY WITH RADIOACTIVE SEED LOCALIZATION;  Surgeon: Emelia Loron, MD;  Location: [facility];  Service: General;  Laterality: Left;   INTRAOCULAR PROSTHESES INSERTION  8/248/2005   Patient Active Problem List   Diagnosis Date Noted   Low back pain 09/02/2021   Trigger finger, right middle finger 08/24/2021   Genetic testing 06/18/2021   Family history of breast cancer 05/26/2021   Family history of bone cancer 05/26/2021   Malignant neoplasm of upper-inner quadrant of left breast in female, estrogen receptor positive (HCC) 05/24/2021   Bilateral primary osteoarthritis of knee 11/12/2019   Obesity 11/12/2019   Chronic right shoulder pain 11/27/2017   Unilateral primary osteoarthritis, left knee 11/27/2017    PCP: Renford Dills, MD   REFERRING PROVIDER: Persons, [place], [place]   REFERRING DIAG:  M25.561,M25.562,G89.29 (ICD-10-CM) - Chronic pain of both knees   THERAPY DIAG:  Other low back  pain  Chronic pain of both knees  Muscle weakness (generalized)  Other abnormalities of gait and mobility  Rationale for Evaluation and Treatment: Rehabilitation  ONSET DATE: chronic- years  SUBJECTIVE:   SUBJECTIVE STATEMENT: Pt indicated no pain upon arrival today.  Pt indicated feeling something has loosened up.    PERTINENT HISTORY: OA bilateral knees, lumbar radiculopathy, HTN, anxiety   PAIN:  NPRS scale: no specific pain upon arrival today Pain location: back, knees Pain description: throbbing and pulling , heavy pressure Aggravating factors: standing up  Relieving factors: laying down, sitting  PRECAUTIONS: Other: no heavy lifting  WEIGHT BEARING RESTRICTIONS: No  FALLS:  Has patient fallen in last 6 months? No  LIVING ENVIRONMENT: Lives with: lives with their spouse Lives in: House/apartment Stairs: Yes: Internal: 14 steps; on right going up and External: 4 steps; can reach both Has following equipment at home: Single point cane  OCCUPATION: retired- Research scientist (medical)  PLOF: Independent  PATIENT GOALS: get more strength in legs and relieve back pain  OBJECTIVE:   DIAGNOSTIC FINDINGS:  MRI 10/2023:  Recent lumbar MRI imaging shows multi level lumbar spine degeneration, degenerative anterolisthesis at L3-L4 and below, there is non compressive foraminal stenosis on the right at L3-L4 and bilaterally at L4-L5. Bilateral subarticular narrowing at L4-L5. No high grade spinal canal stenosis noted.  PATIENT SURVEYS:  Patient-Specific Activity Scoring Scheme  "  0" represents "unable to perform." "10" represents "able to perform at prior level. 0 1 2 3 4 5 6 7 8 9  10 (Date and Score)   Activity Eval  01/18/2024  02/12/2024  1. Keeping balanced 1  5  2. standing 3  5  3. Getting in/out of car 1 7  Score 1.66/10 5.667   Total score = sum of the activity scores/number of activities Minimum detectable change (90%CI) for average score = 2 points Minimum  detectable change (90%CI) for single activity score = 3 points  COGNITION: 01/19/2024 Overall cognitive status: WFL    SENSATION: 01/19/2024 WFL  EDEMA:  01/19/2024 none  MUSCLE LENGTH: 01/19/2024 Not tested  POSTURE:  01/19/2024 rounded shoulders and flexed trunk when standing upright   PALPATION: 01/19/2024 Tender to gluteal area bilaterally  LUMBAR ROM:  01/19/2024 Directional Preference Assessment: Centralization: none observed Peripheralization: none observed  AROM 01/19/2024   Flexion Touches low shin   Extension 25%   Right lateral flexion    Left lateral flexion    Right rotation 50%   Left rotation 50%    (Blank rows = not tested)  LOWER EXTREMITY ROM:      Right 01/19/2024 Left 01/19/2024  Hip flexion    Hip extension    Hip abduction    Hip adduction    Hip internal rotation    Hip external rotation    Knee flexion    Knee extension    Ankle dorsiflexion    Ankle plantarflexion    Ankle inversion    Ankle eversion     (Blank rows = not tested)  LOWER EXTREMITY MMT:    MMT Right 01/19/2024 Left 01/19/2024  Hip flexion 4- 3+  Hip extension    Hip abduction  3  Hip adduction    Hip internal rotation    Hip external rotation    Knee flexion 5 5  Knee extension 5 4-  Ankle dorsiflexion 5 5  Ankle plantarflexion    Ankle inversion    Ankle eversion     (Blank rows = not tested)  LOWER EXTREMITY SPECIAL TESTS:  01/19/2024 None performed   FUNCTIONAL TESTS:  01/19/2024 Not tested at eval  GAIT: 01/19/2024 Distance walked: clinical distances Assistive device utilized: Single point cane Level of assistance: SBA Comments: decreased cadence, flexed trunk, wide step length                                                                                                                                                                        TODAY'S TREATMENT       DATE: 02/07/24 TherEx NuStep L6 x 10 min UE/LE Supine hooklying clam shell  with isometric hold opposite leg green band x  20 bilateral  Bridges with isometric hip abduction hold green band 2 x 10  Single knee to chest 3 x 15 bilaterally  Manual Percussive device to lumbar bilaterally, glute max bilaterally    TODAY'S TREATMENT       DATE: 01/23/2024 Therex: Verbal review of existing HEP c cues. Supine lumbar trunk rotation 15 sec x 3 bilateral Supine bridge c tband around knees green 2 x 10  Supine hooklying clam shell green band x 20 bilateral c contralateral leg isometric hold  Nustep Lvl 6 UE/LE 8 mins for aerobic exercise introduction.  Discussed application of research stating benefits of aerobic exercise on chronic back pain symptoms.   Manual Percussive device to bilateral glute, lumbar lower   TODAY'S TREATMENT       DATE: 01/18/2024 Therex:    HEP instruction/performance c cues for techniques, handout provided.  Trial set performed of each for comprehension and symptom assessment.  See below for exercise list  Manual: Percussive device to bilateral glute and lower lumbar musculature to patient tolerance  PATIENT EDUCATION:  01/19/2024 Education details: HEP, POC Person educated: Patient Education method: Programmer, multimedia, Demonstration, Verbal cues, and Handouts Education comprehension: verbalized understanding, returned demonstration, and verbal cues required  HOME EXERCISE PROGRAM: Access Code: [email or web address]  URL: [email or web address] Date: 01/18/2024 Prepared by: Bonna Bustard   Exercises - Supine Lower Trunk Rotation  - 1-2 x daily - 7 x weekly - 1 sets - 3-5 reps - 15 hold - Supine Bridge  - 1-2 x daily - 7 x weekly - 1-2 sets - 10 reps - 2 hold - Clamshell  - 1-2 x daily - 7 x weekly - 2-3 sets - 10 reps - Supine Quadricep Sets  - 1-2 x daily - 7 x weekly - 1 sets - 10 reps - 5 hold - Small Range Straight Leg Raise  - 1 x daily - 7 x weekly - 1-2 sets - 10 reps    ASSESSMENT:  CLINICAL IMPRESSION: Patient specific  functional scale reassessment showed marked improvement in reporting on activity.  Continued skilled PT services indicated at this time to progress stability, mobility and strength for continued progression.   OBJECTIVE IMPAIRMENTS: Abnormal gait, decreased balance, decreased endurance, decreased knowledge of condition, decreased mobility, difficulty walking, decreased strength, impaired perceived functional ability, and pain.   ACTIVITY LIMITATIONS: carrying, lifting, bending, standing, squatting, stairs, and locomotion level  PARTICIPATION LIMITATIONS: meal prep, cleaning, laundry, shopping, and community activity  PERSONAL FACTORS: Age, Fitness, Time since onset of injury/illness/exacerbation, and 3+ comorbidities: see above  are also affecting patient's functional outcome.   REHAB POTENTIAL: Fair due to time since onset with chronic pain  CLINICAL DECISION MAKING: Evolving/moderate complexity  EVALUATION COMPLEXITY: Moderate   GOALS: Goals reviewed with patient? Yes  SHORT TERM GOALS: (target date for Short term goals are 3 weeks 02/08/2024)   1.  Patient will demonstrate independent use of home exercise program to maintain progress from in clinic treatments.  Goal status: Met  LONG TERM GOALS: (target dates for all long term goals are 10 weeks  03/29/2024 )   1. Patient will demonstrate/report pain at worst less than or equal to 5/10 to facilitate minimal limitation in daily activity secondary to pain symptoms.  Goal status: on going 02/12/2024   2. Patient will demonstrate independent use of home exercise program to facilitate ability to maintain/progress functional gains from skilled physical therapy services.  Goal status:  on going 02/12/2024   3. Patient will demonstrate Patient specific functional  scale avg > or = 7 to indicate reduced disability due to condition.   Goal status:  on going 02/12/2024   4.  Patient will demonstrate BLE MMT 5/5 throughout to faciltiate  usual transfers, stairs, squatting at [facility] for daily life.   Goal status:  on going 02/12/2024   5. Patient will demonstrate up and down a flight of stairs with single hand rail with reciprocal gait pattern.   Goal status:  on going 02/12/2024   6.  Patient will demonstrate lumbar extension >/=75% without symptoms to facilitate upright standing, walking posture at PLOF s limitation.  Goal Status:  on going 02/12/2024   PLAN:  PT FREQUENCY: 1-2x/week  PT DURATION: 10 weeks  PLANNED INTERVENTIONS: Can include 40981- PT Re-evaluation, 97110-Therapeutic exercises, 97530- Therapeutic activity, 97112- Neuromuscular re-education, 97535- Self Care, 97140- Manual therapy, [phone]- Gait training, [phone]- Orthotic Fit/training, [phone]- Canalith repositioning, [id number]- Aquatic Therapy, [phone]- Electrical stimulation (unattended), [phone]- Electrical stimulation (manual), [id number] Physical performance testing, 97016- Vasopneumatic device, [id number]- Ultrasound, [id number]- Traction (mechanical), [id number]- Ionotophoresis 4mg /ml Dexamethasone, Patient/Family education, Balance training, Stair training, Taping, Dry Needling, Joint mobilization, Joint manipulation, Spinal manipulation, Spinal mobilization, Scar mobilization, Vestibular training, Visual/preceptual remediation/compensation, DME instructions, Cryotherapy, and Moist heat.  All performed as medically necessary.  All included unless contraindicated  PLAN FOR NEXT SESSION: Recheck ROM/strength    Bonna Bustard, PT, DPT, OCS, ATC 02/12/24  3:08 PM

## 2024-02-13 ENCOUNTER — Encounter: Payer: Self-pay | Admitting: Hematology and Oncology

## 2024-02-19 ENCOUNTER — Ambulatory Visit (INDEPENDENT_AMBULATORY_CARE_PROVIDER_SITE_OTHER): Admitting: Rehabilitative and Restorative Service Providers"

## 2024-02-19 ENCOUNTER — Encounter: Payer: Self-pay | Admitting: Rehabilitative and Restorative Service Providers"

## 2024-02-19 DIAGNOSIS — R2689 Other abnormalities of gait and mobility: Secondary | ICD-10-CM | POA: Diagnosis not present

## 2024-02-19 DIAGNOSIS — G8929 Other chronic pain: Secondary | ICD-10-CM | POA: Diagnosis not present

## 2024-02-19 DIAGNOSIS — M25561 Pain in right knee: Secondary | ICD-10-CM

## 2024-02-19 DIAGNOSIS — M5459 Other low back pain: Secondary | ICD-10-CM

## 2024-02-19 DIAGNOSIS — M25562 Pain in left knee: Secondary | ICD-10-CM

## 2024-02-19 DIAGNOSIS — M6281 Muscle weakness (generalized): Secondary | ICD-10-CM

## 2024-02-19 NOTE — Therapy (Signed)
 OUTPATIENT PHYSICAL THERAPY TREATMENT   Patient Name: Jillian Buchanan MRN: [id number] DOB:November 01, 1932, 89 y.o., female Today's Date: 02/19/2024  END OF SESSION:  PT End of Session - 02/19/24 1321     Visit Number 5    Number of Visits 16    Date for PT Re-Evaluation 03/29/24    Authorization Type [employer]    Progress Note Due on Visit 10    PT Start Time 1304    PT Stop Time 1343    PT Time Calculation (min) 39 min    Activity Tolerance Patient tolerated treatment well    Behavior During Therapy WFL for tasks assessed/performed                 Past Medical History:  Diagnosis Date   Anxiety    self reported   Breast cancer (HCC)    Cataract    Family history of bone cancer    Family history of breast cancer    Hypertension    Past Surgical History:  Procedure Laterality Date   ABDOMINAL HYSTERECTOMY     BREAST LUMPECTOMY  1988   BREAST LUMPECTOMY WITH RADIOACTIVE SEED LOCALIZATION Left 06/15/2021   Procedure: LEFT BREAST LUMPECTOMY WITH RADIOACTIVE SEED LOCALIZATION;  Surgeon: Enid Harry, MD;  Location: [facility];  Service: General;  Laterality: Left;   INTRAOCULAR PROSTHESES INSERTION  8/248/2005   Patient Active Problem List   Diagnosis Date Noted   Low back pain 09/02/2021   Trigger finger, right middle finger 08/24/2021   Genetic testing 06/18/2021   Family history of breast cancer 05/26/2021   Family history of bone cancer 05/26/2021   Malignant neoplasm of upper-inner quadrant of left breast in female, estrogen receptor positive (HCC) 05/24/2021   Bilateral primary osteoarthritis of knee 11/12/2019   Obesity 11/12/2019   Chronic right shoulder pain 11/27/2017   Unilateral primary osteoarthritis, left knee 11/27/2017    PCP: Merl Star, MD   REFERRING PROVIDER: Persons, Norma [place]ers, PA   REFERRING DIAG:  M25.561,M25.562,G89.29 (ICD-10-CM) - Chronic pain of both knees   THERAPY DIAG:  Other low back  pain  Chronic pain of both knees  Muscle weakness (generalized)  Other abnormalities of gait and mobility  Rationale for Evaluation and Treatment: Rehabilitation  ONSET DATE: chronic- years  SUBJECTIVE:   SUBJECTIVE STATEMENT: Pt indicated being busy this past weekend.  Reported having some back complaints upon arrival today.   PERTINENT HISTORY: OA bilateral knees, lumbar radiculopathy, HTN, anxiety   PAIN:  NPRS scale: no specific pain upon arrival today Pain location: back, knees Pain description: throbbing and pulling , heavy pressure Aggravating factors: standing up  Relieving factors: laying down, sitting  PRECAUTIONS: Other: no heavy lifting  WEIGHT BEARING RESTRICTIONS: No  FALLS:  Has patient fallen in last 6 months? No  LIVING ENVIRONMENT: Lives with: lives with their spouse Lives in: House/apartment Stairs: Yes: Internal: 14 steps; on right going up and External: 4 steps; can reach both Has following equipment at home: Single point cane  OCCUPATION: retired- Research scientist (medical)  PLOF: Independent  PATIENT GOALS: get more strength in legs and relieve back pain  OBJECTIVE:   DIAGNOSTIC FINDINGS:  MRI 10/2023:  Recent lumbar MRI imaging shows multi level lumbar spine degeneration, degenerative anterolisthesis at L3-L4 and below, there is non compressive foraminal stenosis on the right at L3-L4 and bilaterally at L4-L5. Bilateral subarticular narrowing at L4-L5. No high grade spinal canal stenosis noted.  PATIENT SURVEYS:  Patient-Specific Activity Scoring  Scheme  "0" represents "unable to perform." "10" represents "able to perform at prior level. 0 1 2 3 4 5 6 7 8 9  10 (Date and Score)   Activity Eval  01/18/2024  02/12/2024  1. Keeping balanced 1  5  2. standing 3  5  3. Getting in/out of car 1 7  Score 1.66/10 5.667   Total score = sum of the activity scores/number of activities Minimum detectable change (90%CI) for average score = 2  points Minimum detectable change (90%CI) for single activity score = 3 points  COGNITION: 01/19/2024 Overall cognitive status: WFL    SENSATION: 01/19/2024 WFL  EDEMA:  01/19/2024 none  MUSCLE LENGTH: 01/19/2024 Not tested  POSTURE:  01/19/2024 rounded shoulders and flexed trunk when standing upright   PALPATION: 01/19/2024 Tender to gluteal area bilaterally  LUMBAR ROM:  01/19/2024 Directional Preference Assessment: Centralization: none observed Peripheralization: none observed  AROM 01/19/2024 02/19/2024  Flexion Touches low shin   Extension 25% 50 % WFL   Right lateral flexion    Left lateral flexion    Right rotation 50%   Left rotation 50%    (Blank rows = not tested)  LOWER EXTREMITY ROM:      Right 01/19/2024 Left 01/19/2024  Hip flexion    Hip extension    Hip abduction    Hip adduction    Hip internal rotation    Hip external rotation    Knee flexion    Knee extension    Ankle dorsiflexion    Ankle plantarflexion    Ankle inversion    Ankle eversion     (Blank rows = not tested)  LOWER EXTREMITY MMT:    MMT Right 01/19/2024 Left 01/19/2024  Hip flexion 4- 3+  Hip extension    Hip abduction  3  Hip adduction    Hip internal rotation    Hip external rotation    Knee flexion 5 5  Knee extension 5 4-  Ankle dorsiflexion 5 5  Ankle plantarflexion    Ankle inversion    Ankle eversion     (Blank rows = not tested)  LOWER EXTREMITY SPECIAL TESTS:  01/19/2024 None performed   FUNCTIONAL TESTS:  01/19/2024 Not tested at eval  GAIT: 01/19/2024 Distance walked: clinical distances Assistive device utilized: Single point cane Level of assistance: SBA Comments: decreased cadence, flexed trunk, wide step length                                                                                                                                                                        TODAY'S TREATMENT       DATE:  02/19/2024 TherEx NuStep L6 x 10 min  UE/LE Bridges 2 x 10 2-3 sec  hold  Figure 4 push away 15 sec x 3 bilateral Figure 4 pull towards 15 sec x 3 bilateral  Lumbar extension AROM 2 x 5   Manual Percussive device to lumbar bilaterally, glute max bilaterally   TODAY'S TREATMENT       DATE: 02/07/24 TherEx NuStep L6 x 10 min UE/LE Supine hooklying clam shell with isometric hold opposite leg green band x 20 bilateral  Bridges with isometric hip abduction hold green band 2 x 10  Single knee to chest 3 x 15 bilaterally  Manual Percussive device to lumbar bilaterally, glute max bilaterally    TODAY'S TREATMENT       DATE: 01/23/2024 Therex: Verbal review of existing HEP c cues. Supine lumbar trunk rotation 15 sec x 3 bilateral Supine bridge c tband around knees green 2 x 10  Supine hooklying clam shell green band x 20 bilateral c contralateral leg isometric hold  Nustep Lvl 6 UE/LE 8 mins for aerobic exercise introduction.  Discussed application of research stating benefits of aerobic exercise on chronic back pain symptoms.   Manual Percussive device to bilateral glute, lumbar lower   TODAY'S TREATMENT       DATE: 01/18/2024 Therex:    HEP instruction/performance c cues for techniques, handout provided.  Trial set performed of each for comprehension and symptom assessment.  See below for exercise list  Manual: Percussive device to bilateral glute and lower lumbar musculature to patient tolerance  PATIENT EDUCATION:  01/19/2024 Education details: HEP, POC Person educated: Patient Education method: Programmer, multimedia, Demonstration, Verbal cues, and Handouts Education comprehension: verbalized understanding, returned demonstration, and verbal cues required  HOME EXERCISE PROGRAM: Access Code: [email or web address]  URL: [email or web address] Date: 01/18/2024 Prepared by: Bonna Bustard   Exercises - Supine Lower Trunk Rotation  - 1-2 x daily - 7 x weekly - 1 sets - 3-5 reps - 15 hold - Supine Bridge  - 1-2 x daily - 7 x  weekly - 1-2 sets - 10 reps - 2 hold - Clamshell  - 1-2 x daily - 7 x weekly - 2-3 sets - 10 reps - Supine Quadricep Sets  - 1-2 x daily - 7 x weekly - 1 sets - 10 reps - 5 hold - Small Range Straight Leg Raise  - 1 x daily - 7 x weekly - 1-2 sets - 10 reps    ASSESSMENT:  CLINICAL IMPRESSION: Difficulty in hip hike activity control and activation.  Continued emphasis in routine HEP for symptom relief as necessary as well as progressive strengthening.  Lumbar ROM improved today compared to previous assessment.  Continued skilled PT services indicated at this time.   OBJECTIVE IMPAIRMENTS: Abnormal gait, decreased balance, decreased endurance, decreased knowledge of condition, decreased mobility, difficulty walking, decreased strength, impaired perceived functional ability, and pain.   ACTIVITY LIMITATIONS: carrying, lifting, bending, standing, squatting, stairs, and locomotion level  PARTICIPATION LIMITATIONS: meal prep, cleaning, laundry, shopping, and community activity  PERSONAL FACTORS: Age, Fitness, Time since onset of injury/illness/exacerbation, and 3+ comorbidities: see above  are also affecting patient's functional outcome.   REHAB POTENTIAL: Fair due to time since onset with chronic pain  CLINICAL DECISION MAKING: Evolving/moderate complexity  EVALUATION COMPLEXITY: Moderate   GOALS: Goals reviewed with patient? Yes  SHORT TERM GOALS: (target date for Short term goals are 3 weeks 02/08/2024)   1.  Patient will demonstrate independent use of home exercise program to maintain progress from in clinic treatments.  Goal status: Met  LONG TERM GOALS: (target dates for all  long term goals are 10 weeks  03/29/2024 )   1. Patient will demonstrate/report pain at worst less than or equal to 5/10 to facilitate minimal limitation in daily activity secondary to pain symptoms.  Goal status: on going 02/12/2024   2. Patient will demonstrate independent use of home exercise program to  facilitate ability to maintain/progress functional gains from skilled physical therapy services.  Goal status:  on going 02/12/2024   3. Patient will demonstrate Patient specific functional scale avg > or = 7 to indicate reduced disability due to condition.   Goal status:  on going 02/12/2024   4.  Patient will demonstrate BLE MMT 5/5 throughout to faciltiate usual transfers, stairs, squatting at [facility] for daily life.   Goal status:  on going 02/12/2024   5. Patient will demonstrate up and down a flight of stairs with single hand rail with reciprocal gait pattern.   Goal status:  on going 02/12/2024   6.  Patient will demonstrate lumbar extension >/=75% without symptoms to facilitate upright standing, walking posture at PLOF s limitation.  Goal Status:  on going 02/12/2024   PLAN:  PT FREQUENCY: 1-2x/week  PT DURATION: 10 weeks  PLANNED INTERVENTIONS: Can include 96295- PT Re-evaluation, 97110-Therapeutic exercises, 97530- Therapeutic activity, 97112- Neuromuscular re-education, 97535- Self Care, 97140- Manual therapy, [phone]- Gait training, [phone]- Orthotic Fit/training, [phone]- Canalith repositioning, [id number]- Aquatic Therapy, [phone]- Electrical stimulation (unattended), [phone]- Electrical stimulation (manual), [id number] Physical performance testing, 97016- Vasopneumatic device, [id number]- Ultrasound, [id number]- Traction (mechanical), [id number]- Ionotophoresis 4mg /ml Dexamethasone , Patient/Family education, Balance training, Stair training, Taping, Dry Needling, Joint mobilization, Joint manipulation, Spinal manipulation, Spinal mobilization, Scar mobilization, Vestibular training, Visual/preceptual remediation/compensation, DME instructions, Cryotherapy, and Moist heat.  All performed as medically necessary.  All included unless contraindicated  PLAN FOR NEXT SESSION:  Water therapy trial on Friday.    Bonna Bustard, PT, DPT, OCS, ATC 02/19/24  1:37 PM

## 2024-02-23 ENCOUNTER — Ambulatory Visit (INDEPENDENT_AMBULATORY_CARE_PROVIDER_SITE_OTHER): Admitting: Physical Therapy

## 2024-02-23 ENCOUNTER — Encounter: Payer: Self-pay | Admitting: Physical Therapy

## 2024-02-23 DIAGNOSIS — M5459 Other low back pain: Secondary | ICD-10-CM

## 2024-02-23 DIAGNOSIS — M25561 Pain in right knee: Secondary | ICD-10-CM | POA: Diagnosis not present

## 2024-02-23 DIAGNOSIS — M6281 Muscle weakness (generalized): Secondary | ICD-10-CM | POA: Diagnosis not present

## 2024-02-23 DIAGNOSIS — M25562 Pain in left knee: Secondary | ICD-10-CM

## 2024-02-23 DIAGNOSIS — R2689 Other abnormalities of gait and mobility: Secondary | ICD-10-CM

## 2024-02-23 DIAGNOSIS — G8929 Other chronic pain: Secondary | ICD-10-CM

## 2024-02-23 NOTE — Therapy (Signed)
 OUTPATIENT PHYSICAL THERAPY TREATMENT   Patient Name: Jillian Buchanan MRN: [id number] DOB:Jul 08, 1933, 89 y.o., female Today's Date: 02/23/2024  END OF SESSION:  PT End of Session - 02/23/24 1102     Visit Number 6    Number of Visits 16    Date for PT Re-Evaluation 03/29/24    Authorization Type [employer]    Progress Note Due on Visit 10    PT Start Time 1054    PT Stop Time 1139    PT Time Calculation (min) 45 min    Activity Tolerance Patient tolerated treatment well    Behavior During Therapy WFL for tasks assessed/performed                 Past Medical History:  Diagnosis Date   Anxiety    self reported   Breast cancer (HCC)    Cataract    Family history of bone cancer    Family history of breast cancer    Hypertension    Past Surgical History:  Procedure Laterality Date   ABDOMINAL HYSTERECTOMY     BREAST LUMPECTOMY  1988   BREAST LUMPECTOMY WITH RADIOACTIVE SEED LOCALIZATION Left 06/15/2021   Procedure: LEFT BREAST LUMPECTOMY WITH RADIOACTIVE SEED LOCALIZATION;  Surgeon: Enid Harry, MD;  Location: [facility];  Service: General;  Laterality: Left;   INTRAOCULAR PROSTHESES INSERTION  8/248/2005   Patient Active Problem List   Diagnosis Date Noted   Low back pain 09/02/2021   Trigger finger, right middle finger 08/24/2021   Genetic testing 06/18/2021   Family history of breast cancer 05/26/2021   Family history of bone cancer 05/26/2021   Malignant neoplasm of upper-inner quadrant of left breast in female, estrogen receptor positive (HCC) 05/24/2021   Bilateral primary osteoarthritis of knee 11/12/2019   Obesity 11/12/2019   Chronic right shoulder pain 11/27/2017   Unilateral primary osteoarthritis, left knee 11/27/2017    PCP: Merl Star, MD   REFERRING PROVIDER: Persons, Norma Beckers, PA   REFERRING DIAG:  M25.561,M25.562,G89.29 (ICD-10-CM) - Chronic pain of both knees   THERAPY DIAG:  Other low back  pain  Chronic pain of both knees  Muscle weakness (generalized)  Other abnormalities of gait and mobility  Rationale for Evaluation and Treatment: Rehabilitation  ONSET DATE: chronic- years  SUBJECTIVE:   SUBJECTIVE STATEMENT: Pain is about 6/10 today in back and down her leg  PERTINENT HISTORY: OA bilateral knees, lumbar radiculopathy, HTN, anxiety   PAIN:  NPRS scale: 6/10 Pain location: back, knees Pain description: throbbing and pulling , heavy pressure Aggravating factors: standing up  Relieving factors: laying down, sitting  PRECAUTIONS: Other: no heavy lifting  WEIGHT BEARING RESTRICTIONS: No  FALLS:  Has patient fallen in last 6 months? No  LIVING ENVIRONMENT: Lives with: lives with their spouse Lives in: House/apartment Stairs: Yes: Internal: 14 steps; on right going up and External: 4 steps; can reach both Has following equipment at home: Single point cane  OCCUPATION: retired- Research scientist (medical)  PLOF: Independent  PATIENT GOALS: get more strength in legs and relieve back pain  OBJECTIVE:   DIAGNOSTIC FINDINGS:  MRI 10/2023:  Recent lumbar MRI imaging shows multi level lumbar spine degeneration, degenerative anterolisthesis at L3-L4 and below, there is non compressive foraminal stenosis on the right at L3-L4 and bilaterally at L4-L5. Bilateral subarticular narrowing at L4-L5. No high grade spinal canal stenosis noted.  PATIENT SURVEYS:  Patient-Specific Activity Scoring Scheme  "0" represents "unable to perform." "10" represents "able to  perform at prior level. 0 1 2 3 4 5 6 7 8 9  10 (Date and Score)   Activity Eval  01/18/2024  02/12/2024  1. Keeping balanced 1  5  2. standing 3  5  3. Getting in/out of car 1 7  Score 1.66/10 5.667   Total score = sum of the activity scores/number of activities Minimum detectable change (90%CI) for average score = 2 points Minimum detectable change (90%CI) for single activity score = 3  points  COGNITION: 01/19/2024 Overall cognitive status: WFL    SENSATION: 01/19/2024 WFL  EDEMA:  01/19/2024 none  MUSCLE LENGTH: 01/19/2024 Not tested  POSTURE:  01/19/2024 rounded shoulders and flexed trunk when standing upright   PALPATION: 01/19/2024 Tender to gluteal area bilaterally  LUMBAR ROM:  01/19/2024 Directional Preference Assessment: Centralization: none observed Peripheralization: none observed  AROM 01/19/2024 02/19/2024  Flexion Touches low shin   Extension 25% 50 % WFL   Right lateral flexion    Left lateral flexion    Right rotation 50%   Left rotation 50%    (Blank rows = not tested)  LOWER EXTREMITY ROM:      Right 01/19/2024 Left 01/19/2024  Hip flexion    Hip extension    Hip abduction    Hip adduction    Hip internal rotation    Hip external rotation    Knee flexion    Knee extension    Ankle dorsiflexion    Ankle plantarflexion    Ankle inversion    Ankle eversion     (Blank rows = not tested)  LOWER EXTREMITY MMT:    MMT Right 01/19/2024 Left 01/19/2024  Hip flexion 4- 3+  Hip extension    Hip abduction  3  Hip adduction    Hip internal rotation    Hip external rotation    Knee flexion 5 5  Knee extension 5 4-  Ankle dorsiflexion 5 5  Ankle plantarflexion    Ankle inversion    Ankle eversion     (Blank rows = not tested)  LOWER EXTREMITY SPECIAL TESTS:  01/19/2024 None performed   FUNCTIONAL TESTS:  01/19/2024 Not tested at eval  GAIT: 01/19/2024 Distance walked: clinical distances Assistive device utilized: Single point cane Level of assistance: SBA Comments: decreased cadence, flexed trunk, wide step length                                                                                                                                                                       Today's Treatment 02/23/2024 Pt seen for aquatic therapy today.  Treatment took place in water 3.5-4.75 ft in depth at the [facility] pool. Temp of water was 91.  Pt entered/exited the pool via stairs  Pt requires the buoyancy and hydrostatic pressure of water for support, and to offload joints by unweighting joint load by at least 50 % in navel deep water and by at least 75-80% in chest to neck deep water.  Viscosity of the water is needed for resistance of strengthening. Water current perturbations provides challenge to standing balance requiring increased core activation.   Water exercises -Fwd walking, lateral walking and backwards walking horizontal width of pool  3 round trips each -Mini lunge stance push/pull with kickboard 15 reps with left foot fwd, 15 reps Right foot fwd -Standing hamstring curls X 10 -Leg swings abd/add X 15 reps bilat -Leg swings flexion/extension X 15 bilat -Lunge step to pool wall X 10 bilat -Standing shoulder circles into H abd/add "stir the pot" X 15 each way -Standing shoulder abduction/adduction X 15 bilat with dumbell and in squat -Standing shoulder horizontal abd/add X 20 bilat with dumbells -Standing push/pull water dumbells X 15 bilat -Standing marches X 15 bilat -Lumbar "L" stretch 5 sec X 10 flexion, then 5 sec X 10 into extension upon return -Squats from first step of pool X 10 reps -Seated on pool bench, sit to stands X 10, LAQ X 10, hip abd/add X 10 TODAY'S TREATMENT       DATE:  02/19/2024 TherEx NuStep L6 x 10 min UE/LE Bridges 2 x 10 2-3 sec hold  Figure 4 push away 15 sec x 3 bilateral Figure 4 pull towards 15 sec x 3 bilateral  Lumbar extension AROM 2 x 5   Manual Percussive device to lumbar bilaterally, glute max bilaterally   TODAY'S TREATMENT       DATE: 02/07/24 TherEx NuStep L6 x 10 min UE/LE Supine hooklying clam shell with isometric hold opposite leg green band x 20 bilateral  Bridges with isometric hip abduction hold green band 2 x 10  Single knee to chest 3 x 15 bilaterally  Manual Percussive device to lumbar bilaterally, glute max bilaterally     TODAY'S TREATMENT       DATE: 01/23/2024 Therex: Verbal review of existing HEP c cues. Supine lumbar trunk rotation 15 sec x 3 bilateral Supine bridge c tband around knees green 2 x 10  Supine hooklying clam shell green band x 20 bilateral c contralateral leg isometric hold  Nustep Lvl 6 UE/LE 8 mins for aerobic exercise introduction.  Discussed application of research stating benefits of aerobic exercise on chronic back pain symptoms.   Manual Percussive device to bilateral glute, lumbar lower   TODAY'S TREATMENT       DATE: 01/18/2024 Therex:    HEP instruction/performance c cues for techniques, handout provided.  Trial set performed of each for comprehension and symptom assessment.  See below for exercise list  Manual: Percussive device to bilateral glute and lower lumbar musculature to patient tolerance  PATIENT EDUCATION:  01/19/2024 Education details: HEP, POC Person educated: Patient Education method: Programmer, multimedia, Demonstration, Verbal cues, and Handouts Education comprehension: verbalized understanding, returned demonstration, and verbal cues required  HOME EXERCISE PROGRAM: Access Code: [email or web address]  URL: [email or web address] Date: 01/18/2024 Prepared by: Bonna Bustard   Exercises - Supine Lower Trunk Rotation  - 1-2 x daily - 7 x weekly - 1 sets - 3-5 reps - 15 hold - Supine Bridge  - 1-2 x daily - 7 x weekly - 1-2 sets - 10 reps - 2 hold - Clamshell  - 1-2 x daily - 7 x weekly - 2-3 sets - 10 reps - Supine Quadricep Sets  -  1-2 x daily - 7 x weekly - 1 sets - 10 reps - 5 hold - Small Range Straight Leg Raise  - 1 x daily - 7 x weekly - 1-2 sets - 10 reps    ASSESSMENT:  CLINICAL IMPRESSION: She had first aquatic PT session, good overall tolerance noted within session, we will monitor for any soreness to this and modify as needed.   OBJECTIVE IMPAIRMENTS: Abnormal gait, decreased balance, decreased endurance, decreased knowledge of condition,  decreased mobility, difficulty walking, decreased strength, impaired perceived functional ability, and pain.   ACTIVITY LIMITATIONS: carrying, lifting, bending, standing, squatting, stairs, and locomotion level  PARTICIPATION LIMITATIONS: meal prep, cleaning, laundry, shopping, and community activity  PERSONAL FACTORS: Age, Fitness, Time since onset of injury/illness/exacerbation, and 3+ comorbidities: see above  are also affecting patient's functional outcome.   REHAB POTENTIAL: Fair due to time since onset with chronic pain  CLINICAL DECISION MAKING: Evolving/moderate complexity  EVALUATION COMPLEXITY: Moderate   GOALS: Goals reviewed with patient? Yes  SHORT TERM GOALS: (target date for Short term goals are 3 weeks 02/08/2024)   1.  Patient will demonstrate independent use of home exercise program to maintain progress from in clinic treatments.  Goal status: Met  LONG TERM GOALS: (target dates for all long term goals are 10 weeks  03/29/2024 )   1. Patient will demonstrate/report pain at worst less than or equal to 5/10 to facilitate minimal limitation in daily activity secondary to pain symptoms.  Goal status: on going 02/12/2024   2. Patient will demonstrate independent use of home exercise program to facilitate ability to maintain/progress functional gains from skilled physical therapy services.  Goal status:  on going 02/12/2024   3. Patient will demonstrate Patient specific functional scale avg > or = 7 to indicate reduced disability due to condition.   Goal status:  on going 02/12/2024   4.  Patient will demonstrate BLE MMT 5/5 throughout to faciltiate usual transfers, stairs, squatting at [facility] for daily life.   Goal status:  on going 02/12/2024   5. Patient will demonstrate up and down a flight of stairs with single hand rail with reciprocal gait pattern.   Goal status:  on going 02/12/2024   6.  Patient will demonstrate lumbar extension >/=75% without symptoms to  facilitate upright standing, walking posture at PLOF s limitation.  Goal Status:  on going 02/12/2024   PLAN:  PT FREQUENCY: 1-2x/week  PT DURATION: 10 weeks  PLANNED INTERVENTIONS: Can include 16109- PT Re-evaluation, 97110-Therapeutic exercises, 97530- Therapeutic activity, 97112- Neuromuscular re-education, 97535- Self Care, 97140- Manual therapy, [phone]- Gait training, [phone]- Orthotic Fit/training, [phone]- Canalith repositioning, [id number]- Aquatic Therapy, [phone]- Electrical stimulation (unattended), [phone]- Electrical stimulation (manual), [id number] Physical performance testing, 97016- Vasopneumatic device, [id number]- Ultrasound, [id number]- Traction (mechanical), [id number]- Ionotophoresis 4mg /ml Dexamethasone , Patient/Family education, Balance training, Stair training, Taping, Dry Needling, Joint mobilization, Joint manipulation, Spinal manipulation, Spinal mobilization, Scar mobilization, Vestibular training, Visual/preceptual remediation/compensation, DME instructions, Cryotherapy, and Moist heat.  All performed as medically necessary.  All included unless contraindicated  PLAN FOR NEXT SESSION:  how did she respond to water PT, continue with land based program.  Jamee Mazzoni, PT, DPT 02/23/24 11:03 AM

## 2024-02-26 ENCOUNTER — Ambulatory Visit (INDEPENDENT_AMBULATORY_CARE_PROVIDER_SITE_OTHER): Admitting: Rehabilitative and Restorative Service Providers"

## 2024-02-26 ENCOUNTER — Encounter: Payer: Self-pay | Admitting: Rehabilitative and Restorative Service Providers"

## 2024-02-26 DIAGNOSIS — M5459 Other low back pain: Secondary | ICD-10-CM

## 2024-02-26 DIAGNOSIS — R2689 Other abnormalities of gait and mobility: Secondary | ICD-10-CM

## 2024-02-26 DIAGNOSIS — G8929 Other chronic pain: Secondary | ICD-10-CM | POA: Diagnosis not present

## 2024-02-26 DIAGNOSIS — M25561 Pain in right knee: Secondary | ICD-10-CM | POA: Diagnosis not present

## 2024-02-26 DIAGNOSIS — M6281 Muscle weakness (generalized): Secondary | ICD-10-CM | POA: Diagnosis not present

## 2024-02-26 DIAGNOSIS — M25562 Pain in left knee: Secondary | ICD-10-CM

## 2024-02-26 NOTE — Therapy (Signed)
 OUTPATIENT PHYSICAL THERAPY TREATMENT   Patient Name: Jillian Buchanan MRN: [id number] DOB:[birth year], 89 y.o., female Today's Date: 02/26/2024  END OF SESSION:  PT End of Session - 02/26/24 1303     Visit Number 7    Number of Visits 16    Date for PT Re-Evaluation 03/29/24    Authorization Type [employer]    Progress Note Due on Visit 10    PT Start Time 1258    PT Stop Time 1338    PT Time Calculation (min) 40 min    Activity Tolerance Patient tolerated treatment well    Behavior During Therapy WFL for tasks assessed/performed                  Past Medical History:  Diagnosis Date   Anxiety    self reported   Breast cancer (HCC)    Cataract    Family history of bone cancer    Family history of breast cancer    Hypertension    Past Surgical History:  Procedure Laterality Date   ABDOMINAL HYSTERECTOMY     BREAST LUMPECTOMY  1988   BREAST LUMPECTOMY WITH RADIOACTIVE SEED LOCALIZATION Left 06/15/2021   Procedure: LEFT BREAST LUMPECTOMY WITH RADIOACTIVE SEED LOCALIZATION;  Surgeon: Enid Harry, MD;  Location: [facility];  Service: General;  Laterality: Left;   INTRAOCULAR PROSTHESES INSERTION  8/248/2005   Patient Active Problem List   Diagnosis Date Noted   Low back pain 09/02/2021   Trigger finger, right middle finger 08/24/2021   Genetic testing 06/18/2021   Family history of breast cancer 05/26/2021   Family history of bone cancer 05/26/2021   Malignant neoplasm of upper-inner quadrant of left breast in female, estrogen receptor positive (HCC) 05/24/2021   Bilateral primary osteoarthritis of knee 11/12/2019   Obesity 11/12/2019   Chronic right shoulder pain 11/27/2017   Unilateral primary osteoarthritis, left knee 11/27/2017    PCP: Merl Star, MD   REFERRING PROVIDER: Persons, Norma Beckers, PA   REFERRING DIAG:  M25.561,M25.562,G89.29 (ICD-10-CM) - Chronic pain of both knees   THERAPY DIAG:  Other low back  pain  Chronic pain of both knees  Muscle weakness (generalized)  Other abnormalities of gait and mobility  Rationale for Evaluation and Treatment: Rehabilitation  ONSET DATE: chronic- years  SUBJECTIVE:   SUBJECTIVE STATEMENT: Pt indicated Friday and start of weekend was better but yesterday and today having pain "all over legs and butt."  Pt indicated sitting no pain.  Pt indicated doing well with water aerobics.   PERTINENT HISTORY: OA bilateral knees, lumbar radiculopathy, HTN, anxiety   PAIN:  NPRS scale: 6/10 Pain location: back, knees Pain description: throbbing and pulling , heavy pressure Aggravating factors: standing up  Relieving factors: laying down, sitting  PRECAUTIONS: Other: no heavy lifting  WEIGHT BEARING RESTRICTIONS: No  FALLS:  Has patient fallen in last 6 months? No  LIVING ENVIRONMENT: Lives with: lives with their spouse Lives in: House/apartment Stairs: Yes: Internal: 14 steps; on right going up and External: 4 steps; can reach both Has following equipment at home: Single point cane  OCCUPATION: retired- Research scientist (medical)  PLOF: Independent  PATIENT GOALS: get more strength in legs and relieve back pain  OBJECTIVE:   DIAGNOSTIC FINDINGS:  MRI 10/2023:  Recent lumbar MRI imaging shows multi level lumbar spine degeneration, degenerative anterolisthesis at L3-L4 and below, there is non compressive foraminal stenosis on the right at L3-L4 and bilaterally at L4-L5. Bilateral subarticular narrowing at L4-L5.  No high grade spinal canal stenosis noted.  PATIENT SURVEYS:  Patient-Specific Activity Scoring Scheme  "0" represents "unable to perform." "10" represents "able to perform at prior level. 0 1 2 3 4 5 6 7 8 9  10 (Date and Score)   Activity Eval  01/18/2024  02/12/2024  1. Keeping balanced 1  5  2. standing 3  5  3. Getting in/out of car 1 7  Score 1.66/10 5.667   Total score = sum of the activity scores/number of  activities Minimum detectable change (90%CI) for average score = 2 points Minimum detectable change (90%CI) for single activity score = 3 points  COGNITION: 01/19/2024 Overall cognitive status: WFL    SENSATION: 01/19/2024 WFL  EDEMA:  01/19/2024 none  MUSCLE LENGTH: 01/19/2024 Not tested  POSTURE:  01/19/2024 rounded shoulders and flexed trunk when standing upright   PALPATION: 01/19/2024 Tender to gluteal area bilaterally  LUMBAR ROM:  01/19/2024 Directional Preference Assessment: Centralization: none observed Peripheralization: none observed  AROM 01/19/2024 02/19/2024  Flexion Touches low shin   Extension 25% 50 % WFL   Right lateral flexion    Left lateral flexion    Right rotation 50%   Left rotation 50%    (Blank rows = not tested)  LOWER EXTREMITY ROM:      Right 01/19/2024 Left 01/19/2024  Hip flexion    Hip extension    Hip abduction    Hip adduction    Hip internal rotation    Hip external rotation    Knee flexion    Knee extension    Ankle dorsiflexion    Ankle plantarflexion    Ankle inversion    Ankle eversion     (Blank rows = not tested)  LOWER EXTREMITY MMT:    MMT Right 01/19/2024 Left 01/19/2024 Right 02/26/2024 Left 02/26/2024  Hip flexion 4- 3+ 4/5 4/5  Hip extension      Hip abduction  3    Hip adduction      Hip internal rotation      Hip external rotation      Knee flexion 5 5 5/5 5/5  Knee extension 5 4- 5/5 4+/5  Ankle dorsiflexion 5 5    Ankle plantarflexion      Ankle inversion      Ankle eversion       (Blank rows = not tested)  LOWER EXTREMITY SPECIAL TESTS:  01/19/2024 None performed   FUNCTIONAL TESTS:  01/19/2024 Not tested at eval  GAIT: 01/19/2024 Distance walked: clinical distances Assistive device utilized: Single point cane Level of assistance: SBA Comments: decreased cadence, flexed trunk, wide step length  TODAY'S TREATMENT       DATE:  02/19/2024 TherEx NuStep L6 x 10 min UE/LE Bridges 2 x 10 2-3 sec hold  Figure 4 pull towards 15 sec x 3 bilateral  Lumbar extension AROM x5  Neuro Re-ed  Tandem stance 1 min x 2 bilateral on floor with mirror visual feedback, occasional HHA on bars.  Standing hip abduction, extension single leg kicks x 10 with light bilateral HHA on bar with cues for reduced hand pressure for SLS focus.    Manual Percussive device to lumbar bilaterally, glute max bilaterally    TODAY'S TREATMENT       DATE: 02/23/2024 Pt seen for aquatic therapy today.  Treatment took place in water 3.5-4.75 ft in depth at the [facility] pool. Temp of water was 91.  Pt entered/exited the pool via stairs   Pt requires the buoyancy and hydrostatic pressure of water for support, and to offload joints by unweighting joint load by at least 50 % in navel deep water and by at least 75-80% in chest to neck deep water.  Viscosity of the water is needed for resistance of strengthening. Water current perturbations provides challenge to standing balance requiring increased core activation.   Water exercises -Fwd walking, lateral walking and backwards walking horizontal width of pool  3 round trips each -Mini lunge stance push/pull with kickboard 15 reps with left foot fwd, 15 reps Right foot fwd -Standing hamstring curls X 10 -Leg swings abd/add X 15 reps bilat -Leg swings flexion/extension X 15 bilat -Lunge step to pool wall X 10 bilat -Standing shoulder circles into H abd/add "stir the pot" X 15 each way -Standing shoulder abduction/adduction X 15 bilat with dumbell and in squat -Standing shoulder horizontal abd/add X 20 bilat with dumbells -Standing push/pull water dumbells X 15 bilat -Standing marches X 15 bilat -Lumbar "L" stretch 5 sec X 10 flexion, then 5 sec X 10 into extension upon return -Squats from  first step of pool X 10 reps -Seated on pool bench, sit to stands X 10, LAQ X 10, hip abd/add X 10 TODAY'S TREATMENT       DATE:  02/19/2024 TherEx NuStep L6 x 10 min UE/LE Bridges 2 x 10 2-3 sec hold  Figure 4 push away 15 sec x 3 bilateral Figure 4 pull towards 15 sec x 3 bilateral  Lumbar extension AROM 2 x 5   Manual Percussive device to lumbar bilaterally, glute max bilaterally   TODAY'S TREATMENT       DATE: 02/07/24 TherEx NuStep L6 x 10 min UE/LE Supine hooklying clam shell with isometric hold opposite leg green band x 20 bilateral  Bridges with isometric hip abduction hold green band 2 x 10  Single knee to chest 3 x 15 bilaterally  Manual Percussive device to lumbar bilaterally, glute max bilaterally    PATIENT EDUCATION:  01/19/2024 Education details: HEP, POC Person educated: Patient Education method: Programmer, multimedia, Demonstration, Verbal cues, and Handouts Education comprehension: verbalized understanding, returned demonstration, and verbal cues required  HOME EXERCISE PROGRAM: Access Code: [email or web address]  URL: [email or web address] Date: 01/18/2024 Prepared by: Bonna Bustard   Exercises - Supine Lower Trunk Rotation  - 1-2 x daily - 7 x weekly - 1 sets - 3-5 reps - 15 hold - Supine Bridge  - 1-2 x daily - 7 x weekly - 1-2 sets - 10 reps - 2 hold - Clamshell  - 1-2 x daily - 7 x weekly - 2-3 sets - 10 reps - Supine Quadricep  Sets  - 1-2 x daily - 7 x weekly - 1 sets - 10 reps - 5 hold - Small Range Straight Leg Raise  - 1 x daily - 7 x weekly - 1-2 sets - 10 reps    ASSESSMENT:  CLINICAL IMPRESSION: Encouraged continued mobility and strengthening with focus on combination of any water based and normal land based interventions.  Fair control to good control on balance intervention on flat surface today static tandem stance.  Continued skilled PT services indicated at this time.   OBJECTIVE IMPAIRMENTS: Abnormal gait, decreased balance, decreased  endurance, decreased knowledge of condition, decreased mobility, difficulty walking, decreased strength, impaired perceived functional ability, and pain.   ACTIVITY LIMITATIONS: carrying, lifting, bending, standing, squatting, stairs, and locomotion level  PARTICIPATION LIMITATIONS: meal prep, cleaning, laundry, shopping, and community activity  PERSONAL FACTORS: Age, Fitness, Time since onset of injury/illness/exacerbation, and 3+ comorbidities: see above  are also affecting patient's functional outcome.   REHAB POTENTIAL: Fair due to time since onset with chronic pain  CLINICAL DECISION MAKING: Evolving/moderate complexity  EVALUATION COMPLEXITY: Moderate   GOALS: Goals reviewed with patient? Yes  SHORT TERM GOALS: (target date for Short term goals are 3 weeks 02/08/2024)   1.  Patient will demonstrate independent use of home exercise program to maintain progress from in clinic treatments.  Goal status: Met  LONG TERM GOALS: (target dates for all long term goals are 10 weeks  03/29/2024 )   1. Patient will demonstrate/report pain at worst less than or equal to 5/10 to facilitate minimal limitation in daily activity secondary to pain symptoms.  Goal status: on going 02/12/2024   2. Patient will demonstrate independent use of home exercise program to facilitate ability to maintain/progress functional gains from skilled physical therapy services.  Goal status:  on going 02/12/2024   3. Patient will demonstrate Patient specific functional scale avg > or = 7 to indicate reduced disability due to condition.   Goal status:  on going 02/12/2024   4.  Patient will demonstrate BLE MMT 5/5 throughout to faciltiate usual transfers, stairs, squatting at [facility] for daily life.   Goal status:  on going 02/12/2024   5. Patient will demonstrate up and down a flight of stairs with single hand rail with reciprocal gait pattern.   Goal status:  on going 02/12/2024   6.  Patient will demonstrate  lumbar extension >/=75% without symptoms to facilitate upright standing, walking posture at PLOF s limitation.  Goal Status:  on going 02/12/2024   PLAN:  PT FREQUENCY: 1-2x/week  PT DURATION: 10 weeks  PLANNED INTERVENTIONS: Can include 40981- PT Re-evaluation, 97110-Therapeutic exercises, 97530- Therapeutic activity, 97112- Neuromuscular re-education, 97535- Self Care, 97140- Manual therapy, [phone]- Gait training, [phone]- Orthotic Fit/training, [phone]- Canalith repositioning, [id number]- Aquatic Therapy, [phone]- Electrical stimulation (unattended), [phone]- Electrical stimulation (manual), [id number] Physical performance testing, 97016- Vasopneumatic device, [id number]- Ultrasound, [id number]- Traction (mechanical), [id number]- Ionotophoresis 4mg /ml Dexamethasone , Patient/Family education, Balance training, Stair training, Taping, Dry Needling, Joint mobilization, Joint manipulation, Spinal manipulation, Spinal mobilization, Scar mobilization, Vestibular training, Visual/preceptual remediation/compensation, DME instructions, Cryotherapy, and Moist heat.  All performed as medically necessary.  All included unless contraindicated  PLAN FOR NEXT SESSION: Progressive strengthening/balance.  Has one more aquatic therapy visits.  Did discuss addition of 1 in clinic 1-2 weeks out for re assessment.    Bonna Bustard, PT, DPT, OCS, ATC 02/26/24  1:42 PM

## 2024-03-01 ENCOUNTER — Encounter: Payer: Self-pay | Admitting: Physical Therapy

## 2024-03-01 ENCOUNTER — Ambulatory Visit (INDEPENDENT_AMBULATORY_CARE_PROVIDER_SITE_OTHER): Admitting: Physical Therapy

## 2024-03-01 DIAGNOSIS — M25562 Pain in left knee: Secondary | ICD-10-CM

## 2024-03-01 DIAGNOSIS — G8929 Other chronic pain: Secondary | ICD-10-CM

## 2024-03-01 DIAGNOSIS — M25561 Pain in right knee: Secondary | ICD-10-CM | POA: Diagnosis not present

## 2024-03-01 DIAGNOSIS — R2689 Other abnormalities of gait and mobility: Secondary | ICD-10-CM

## 2024-03-01 DIAGNOSIS — M5459 Other low back pain: Secondary | ICD-10-CM | POA: Diagnosis not present

## 2024-03-01 DIAGNOSIS — M6281 Muscle weakness (generalized): Secondary | ICD-10-CM

## 2024-03-01 NOTE — Therapy (Addendum)
 OUTPATIENT PHYSICAL THERAPY TREATMENT PHYSICAL THERAPY DISCHARGE SUMMARY  Visits from Start of Care: 8  Current functional level related to goals / functional outcomes: See below   Remaining deficits: See below   Education / Equipment: HEP  Plan:  Patient goals were not met. Patient is being discharged due to not returning since last visit >30 days  Redell Moose, PT, DPT 05/08/24 9:07 AM     Patient Name: Jillian Buchanan MRN: [id number] DOB:02-06-1933, 89 y.o., female Today's Date: 03/01/2024  END OF SESSION:  PT End of Session - 03/01/24 1054     Visit Number 8    Number of Visits 16    Date for PT Re-Evaluation 03/29/24    Authorization Type [employer]    Progress Note Due on Visit 10    PT Start Time 1050    PT Stop Time 1130    PT Time Calculation (min) 40 min    Activity Tolerance Patient tolerated treatment well    Behavior During Therapy WFL for tasks assessed/performed                  Past Medical History:  Diagnosis Date   Anxiety    self reported   Breast cancer (HCC)    Cataract    Family history of bone cancer    Family history of breast cancer    Hypertension    Past Surgical History:  Procedure Laterality Date   ABDOMINAL HYSTERECTOMY     BREAST LUMPECTOMY  1988   BREAST LUMPECTOMY WITH RADIOACTIVE SEED LOCALIZATION Left 06/15/2021   Procedure: LEFT BREAST LUMPECTOMY WITH RADIOACTIVE SEED LOCALIZATION;  Surgeon: Ebbie Cough, MD;  Location: [facility];  Service: General;  Laterality: Left;   INTRAOCULAR PROSTHESES INSERTION  8/248/2005   Patient Active Problem List   Diagnosis Date Noted   Low back pain 09/02/2021   Trigger finger, right middle finger 08/24/2021   Genetic testing 06/18/2021   Family history of breast cancer 05/26/2021   Family history of bone cancer 05/26/2021   Malignant neoplasm of upper-inner quadrant of left breast in female, estrogen receptor positive (HCC) 05/24/2021   Bilateral  primary osteoarthritis of knee 11/12/2019   Obesity 11/12/2019   Chronic right shoulder pain 11/27/2017   Unilateral primary osteoarthritis, left knee 11/27/2017    PCP: Rexanne Ingle, MD   REFERRING PROVIDER: Persons, Ronal Dragon, PA   REFERRING DIAG:  M25.561,M25.562,G89.29 (ICD-10-CM) - Chronic pain of both knees   THERAPY DIAG:  Other low back pain  Chronic pain of both knees  Muscle weakness (generalized)  Other abnormalities of gait and mobility  Rationale for Evaluation and Treatment: Rehabilitation  ONSET DATE: chronic- years  SUBJECTIVE:   SUBJECTIVE STATEMENT: States she is not having as much pain, felt like she was worked out appropriately at her last aquatic PT visit.  PERTINENT HISTORY: OA bilateral knees, lumbar radiculopathy, HTN, anxiety   PAIN:  NPRS scale: 3/10 Pain location: back, knees Pain description: throbbing and pulling , heavy pressure Aggravating factors: standing up  Relieving factors: laying down, sitting  PRECAUTIONS: Other: no heavy lifting  WEIGHT BEARING RESTRICTIONS: No  FALLS:  Has patient fallen in last 6 months? No  LIVING ENVIRONMENT: Lives with: lives with their spouse Lives in: House/apartment Stairs: Yes: Internal: 14 steps; on right going up and External: 4 steps; can reach both Has following equipment at home: Single point cane  OCCUPATION: retired- Research scientist (medical)  PLOF: Independent  PATIENT GOALS: get more strength  in legs and relieve back pain  OBJECTIVE:   DIAGNOSTIC FINDINGS:  MRI 10/2023:  Recent lumbar MRI imaging shows multi level lumbar spine degeneration, degenerative anterolisthesis at L3-L4 and below, there is non compressive foraminal stenosis on the right at L3-L4 and bilaterally at L4-L5. Bilateral subarticular narrowing at L4-L5. No high grade spinal canal stenosis noted.  PATIENT SURVEYS:  Patient-Specific Activity Scoring Scheme  0 represents "unable to perform." 10 represents "able  to perform at prior level. 0 1 2 3 4 5 6 7 8 9  10 (Date and Score)   Activity Eval  01/18/2024  02/12/2024  1. Keeping balanced 1  5  2. standing 3  5  3. Getting in/out of car 1 7  Score 1.66/10 5.667   Total score = sum of the activity scores/number of activities Minimum detectable change (90%CI) for average score = 2 points Minimum detectable change (90%CI) for single activity score = 3 points  COGNITION: 01/19/2024 Overall cognitive status: WFL    SENSATION: 01/19/2024 WFL  EDEMA:  01/19/2024 none  MUSCLE LENGTH: 01/19/2024 Not tested  POSTURE:  01/19/2024 rounded shoulders and flexed trunk when standing upright   PALPATION: 01/19/2024 Tender to gluteal area bilaterally  LUMBAR ROM:  01/19/2024 Directional Preference Assessment: Centralization: none observed Peripheralization: none observed  AROM 01/19/2024 02/19/2024  Flexion Touches low shin   Extension 25% 50 % WFL   Right lateral flexion    Left lateral flexion    Right rotation 50%   Left rotation 50%    (Blank rows = not tested)  LOWER EXTREMITY ROM:      Right 01/19/2024 Left 01/19/2024  Hip flexion    Hip extension    Hip abduction    Hip adduction    Hip internal rotation    Hip external rotation    Knee flexion    Knee extension    Ankle dorsiflexion    Ankle plantarflexion    Ankle inversion    Ankle eversion     (Blank rows = not tested)  LOWER EXTREMITY MMT:    MMT Right 01/19/2024 Left 01/19/2024 Right 02/26/2024 Left 02/26/2024  Hip flexion 4- 3+ 4/5 4/5  Hip extension      Hip abduction  3    Hip adduction      Hip internal rotation      Hip external rotation      Knee flexion 5 5 5/5 5/5  Knee extension 5 4- 5/5 4+/5  Ankle dorsiflexion 5 5    Ankle plantarflexion      Ankle inversion      Ankle eversion       (Blank rows = not tested)  LOWER EXTREMITY SPECIAL TESTS:  01/19/2024 None performed   FUNCTIONAL TESTS:  01/19/2024 Not tested at  eval  GAIT: 01/19/2024 Distance walked: clinical distances Assistive device utilized: Single point cane Level of assistance: SBA Comments: decreased cadence, flexed trunk, wide step length  TODAY'S TREATMENT       DATE:  02/19/2024 TherEx NuStep L6 x 10 min UE/LE Bridges 2 x 10 2-3 sec hold  Figure 4 pull towards 15 sec x 3 bilateral  Lumbar extension AROM x5  Neuro Re-ed  Tandem stance 1 min x 2 bilateral on floor with mirror visual feedback, occasional HHA on bars.  Standing hip abduction, extension single leg kicks x 10 with light bilateral HHA on bar with cues for reduced hand pressure for SLS focus.    Manual Percussive device to lumbar bilaterally, glute max bilaterally    TODAY'S TREATMENT       DATE:  03/01/2024 Pt seen for aquatic therapy today.  Treatment took place in water 3.5-4.75 ft in depth at the [facility] pool. Temp of water was 91.  Pt entered/exited the pool via stairs   Pt requires the buoyancy and hydrostatic pressure of water for support, and to offload joints by unweighting joint load by at least 50 % in navel deep water and by at least 75-80% in chest to neck deep water.  Viscosity of the water is needed for resistance of strengthening. Water current perturbations provides challenge to standing balance requiring increased core activation.   Water exercises -Fwd walking, lateral walking and backwards walking horizontal width of pool  3 round trips each -Mini lunge stance push/pull with kickboard 15 reps with left foot fwd, 15 reps Right foot fwd -Standing hamstring curls X 10 -Leg swings abd/add X 15 reps bilat -Leg swings flexion/extension X 15 bilat -Lunge step to pool wall X 10 bilat -Standing shoulder circles into H abd/add stir the pot X 15 each way -Standing shoulder  abduction/adduction X 15 bilat with dumbell and in squat -Standing hip circles X 15 CW,CCW -Standing push/pull water dumbells X 15 bilat -Standing marches X 15 bilat -Lumbar L stretch 5 sec X 10 flexion, then 5 sec X 10 into extension upon return -Squats from first step of pool X 10 reps -Seated on pool bench, sit to stands X 10, LAQ X 10, hip abd/add X 10  02/23/2024 Pt seen for aquatic therapy today.  Treatment took place in water 3.5-4.75 ft in depth at the [facility] pool. Temp of water was 91.  Pt entered/exited the pool via stairs   Pt requires the buoyancy and hydrostatic pressure of water for support, and to offload joints by unweighting joint load by at least 50 % in navel deep water and by at least 75-80% in chest to neck deep water.  Viscosity of the water is needed for resistance of strengthening. Water current perturbations provides challenge to standing balance requiring increased core activation.   Water exercises -Fwd walking, lateral walking and backwards walking horizontal width of pool  3 round trips each -Mini lunge stance push/pull with kickboard 15 reps with left foot fwd, 15 reps Right foot fwd -Standing hamstring curls X 10 -Leg swings abd/add X 15 reps bilat -Leg swings flexion/extension X 15 bilat -Lunge step to pool wall X 10 bilat -Standing shoulder circles into H abd/add stir the pot X 15 each way -Standing shoulder abduction/adduction X 15 bilat with dumbell and in squat -Standing shoulder horizontal abd/add X 20 bilat with dumbells -Standing push/pull water dumbells X 15 bilat -Standing marches X 15 bilat -Lumbar L stretch 5 sec X 10 flexion, then 5 sec X 10 into extension upon return -Squats from first step of pool X 10 reps -Seated on pool bench, sit to stands X 10, LAQ  X 10, hip abd/add X 10 TODAY'S TREATMENT       DATE:  02/19/2024 TherEx NuStep L6 x 10 min UE/LE Bridges 2 x 10 2-3 sec hold  Figure 4 push away 15 sec x 3  bilateral Figure 4 pull towards 15 sec x 3 bilateral  Lumbar extension AROM 2 x 5   Manual Percussive device to lumbar bilaterally, glute max bilaterally   TODAY'S TREATMENT       DATE: 02/07/24 TherEx NuStep L6 x 10 min UE/LE Supine hooklying clam shell with isometric hold opposite leg green band x 20 bilateral  Bridges with isometric hip abduction hold green band 2 x 10  Single knee to chest 3 x 15 bilaterally  Manual Percussive device to lumbar bilaterally, glute max bilaterally    PATIENT EDUCATION:  01/19/2024 Education details: HEP, POC Person educated: Patient Education method: Programmer, multimedia, Demonstration, Verbal cues, and Handouts Education comprehension: verbalized understanding, returned demonstration, and verbal cues required  HOME EXERCISE PROGRAM: Access Code: [email or web address]  URL: [email or web address] Date: 01/18/2024 Prepared by: Ozell Silvan   Exercises - Supine Lower Trunk Rotation  - 1-2 x daily - 7 x weekly - 1 sets - 3-5 reps - 15 hold - Supine Bridge  - 1-2 x daily - 7 x weekly - 1-2 sets - 10 reps - 2 hold - Clamshell  - 1-2 x daily - 7 x weekly - 2-3 sets - 10 reps - Supine Quadricep Sets  - 1-2 x daily - 7 x weekly - 1 sets - 10 reps - 5 hold - Small Range Straight Leg Raise  - 1 x daily - 7 x weekly - 1-2 sets - 10 reps  Aquatic HEP Access Code: [email or web address] URL: [email or web address] Date: 03/01/2024 Prepared by: Redell Moose  Exercises - Standing March at [facility]  - 1 x daily - 2 x weekly - 15 reps - Standing Hip Flexion Extension at [employer]  - 1 x daily - 2 x weekly - 15 reps - Standing Hip Abduction Adduction at Pool Wall  - 1 x daily - 2 x weekly - 20 reps - Forward Walking  - 1 x daily - 2 x weekly - 4 sets - Side Stepping  - 2 x daily - 2 x weekly - 4 sets - 10 reps - Bilateral Shoulder Horizontal Abduction Adduction AROM  - 1 x daily - 2 x weekly - 4 sets - Standing Knee Flexion  - 2 x daily - 6 x weekly - 3 sets  - 10 reps - Squat  - 1 x daily - 2 x weekly - 15 reps    ASSESSMENT:  CLINICAL IMPRESSION: She had good tolerance to aquatic PT exercisers. I printed out and laminated aquatic HEP for her that she can use moving forward as we will no longer have any providers to provide aquatic PT for this clinic. I recommend she continue on with land based PT at [facility] so she will need to call to set up additional visits.   OBJECTIVE IMPAIRMENTS: Abnormal gait, decreased balance, decreased endurance, decreased knowledge of condition, decreased mobility, difficulty walking, decreased strength, impaired perceived functional ability, and pain.   ACTIVITY LIMITATIONS: carrying, lifting, bending, standing, squatting, stairs, and locomotion level  PARTICIPATION LIMITATIONS: meal prep, cleaning, laundry, shopping, and community activity  PERSONAL FACTORS: Age, Fitness, Time since onset of injury/illness/exacerbation, and 3+ comorbidities: see above are also affecting patient's functional outcome.   REHAB POTENTIAL: Fair due to time since onset with chronic pain  CLINICAL DECISION MAKING: Evolving/moderate complexity  EVALUATION COMPLEXITY: Moderate   GOALS: Goals reviewed with patient? Yes  SHORT TERM GOALS: (target date for Short term goals are 3 weeks 02/08/2024)   1.  Patient will demonstrate independent use of home exercise program to maintain progress from in clinic treatments.  Goal status: Met  LONG TERM GOALS: (target dates for all long term goals are 10 weeks  03/29/2024 )   1. Patient will demonstrate/report pain at worst less than or equal to 5/10 to facilitate minimal limitation in daily activity secondary to pain symptoms.  Goal status: on going 02/12/2024   2. Patient will demonstrate independent use of home exercise program to facilitate ability to maintain/progress functional gains from skilled physical therapy services.  Goal status:  on going 02/12/2024   3. Patient will  demonstrate Patient specific functional scale avg > or = 7 to indicate reduced disability due to condition.   Goal status:  on going 02/12/2024   4.  Patient will demonstrate BLE MMT 5/5 throughout to faciltiate usual transfers, stairs, squatting at [facility] for daily life.   Goal status:  on going 02/12/2024   5. Patient will demonstrate up and down a flight of stairs with single hand rail with reciprocal gait pattern.   Goal status:  on going 02/12/2024   6.  Patient will demonstrate lumbar extension >/=75% without symptoms to facilitate upright standing, walking posture at PLOF s limitation.  Goal Status:  on going 02/12/2024   PLAN:  PT FREQUENCY: 1-2x/week  PT DURATION: 10 weeks  PLANNED INTERVENTIONS: Can include 02853- PT Re-evaluation, 97110-Therapeutic exercises, 97530- Therapeutic activity, 97112- Neuromuscular re-education, 97535- Self Care, 97140- Manual therapy, [phone]- Gait training, [phone]- Orthotic Fit/training, [phone]- Canalith repositioning, [id number]- Aquatic Therapy, [phone]- Electrical stimulation (unattended), [phone]- Electrical stimulation (manual), [id number] Physical performance testing, 97016- Vasopneumatic device, [id number]- Ultrasound, [id number]- Traction (mechanical), [id number]- Ionotophoresis 4mg /ml Dexamethasone , Patient/Family education, Balance training, Stair training, Taping, Dry Needling, Joint mobilization, Joint manipulation, Spinal manipulation, Spinal mobilization, Scar mobilization, Vestibular training, Visual/preceptual remediation/compensation, DME instructions, Cryotherapy, and Moist heat.  All performed as medically necessary.  All included unless contraindicated  PLAN FOR NEXT SESSION: Progressive strengthening/balance.   Redell Moose, PT, DPT 03/01/24 10:55 AM

## 2024-04-07 ENCOUNTER — Other Ambulatory Visit: Payer: Self-pay | Admitting: Hematology and Oncology

## 2024-04-15 DIAGNOSIS — F331 Major depressive disorder, recurrent, moderate: Secondary | ICD-10-CM | POA: Diagnosis not present

## 2024-04-15 DIAGNOSIS — M545 Low back pain, unspecified: Secondary | ICD-10-CM | POA: Diagnosis not present

## 2024-04-15 DIAGNOSIS — R739 Hyperglycemia, unspecified: Secondary | ICD-10-CM | POA: Diagnosis not present

## 2024-04-15 DIAGNOSIS — E7800 Pure hypercholesterolemia, unspecified: Secondary | ICD-10-CM | POA: Diagnosis not present

## 2024-04-15 DIAGNOSIS — I10 Essential (primary) hypertension: Secondary | ICD-10-CM | POA: Diagnosis not present

## 2024-04-15 DIAGNOSIS — Z1331 Encounter for screening for depression: Secondary | ICD-10-CM | POA: Diagnosis not present

## 2024-04-15 DIAGNOSIS — R202 Paresthesia of skin: Secondary | ICD-10-CM | POA: Diagnosis not present

## 2024-04-15 DIAGNOSIS — N1831 Chronic kidney disease, stage 3a: Secondary | ICD-10-CM | POA: Diagnosis not present

## 2024-04-15 DIAGNOSIS — M79676 Pain in unspecified toe(s): Secondary | ICD-10-CM | POA: Diagnosis not present

## 2024-04-15 DIAGNOSIS — E039 Hypothyroidism, unspecified: Secondary | ICD-10-CM | POA: Diagnosis not present

## 2024-04-15 DIAGNOSIS — Z0000 Encounter for general adult medical examination without abnormal findings: Secondary | ICD-10-CM | POA: Diagnosis not present

## 2024-04-15 DIAGNOSIS — C50912 Malignant neoplasm of unspecified site of left female breast: Secondary | ICD-10-CM | POA: Diagnosis not present

## 2024-05-21 DIAGNOSIS — Z1231 Encounter for screening mammogram for malignant neoplasm of breast: Secondary | ICD-10-CM | POA: Diagnosis not present

## 2024-05-24 ENCOUNTER — Encounter: Payer: Self-pay | Admitting: Hematology and Oncology

## 2024-08-12 ENCOUNTER — Other Ambulatory Visit: Payer: Self-pay | Admitting: Physical Medicine and Rehabilitation

## 2024-08-12 ENCOUNTER — Telehealth: Payer: Self-pay | Admitting: Physical Medicine and Rehabilitation

## 2024-08-12 DIAGNOSIS — M5416 Radiculopathy, lumbar region: Secondary | ICD-10-CM

## 2024-08-12 NOTE — Telephone Encounter (Signed)
 Patient called and wants to schedule for back injection. CB#[phone]

## 2024-08-15 ENCOUNTER — Telehealth: Payer: Self-pay | Admitting: Physical Medicine and Rehabilitation

## 2024-08-15 NOTE — Telephone Encounter (Signed)
 I have attempted to call several time without success, we were finally able get in touch to schedule injection today.SABRA

## 2024-08-15 NOTE — Telephone Encounter (Signed)
 Pt called to make an appt for a back injection. Doesn't know what kind.Said that we were checking with insurance and would follow up, but she hasn't heard anything back. Call back number [phone], [phone]

## 2024-08-16 ENCOUNTER — Encounter: Payer: Self-pay | Admitting: Physician Assistant

## 2024-08-16 ENCOUNTER — Ambulatory Visit (INDEPENDENT_AMBULATORY_CARE_PROVIDER_SITE_OTHER): Admitting: Physician Assistant

## 2024-08-16 DIAGNOSIS — M1712 Unilateral primary osteoarthritis, left knee: Secondary | ICD-10-CM | POA: Diagnosis not present

## 2024-08-16 MED ORDER — LIDOCAINE HCL 1 % IJ SOLN
4.0000 mL | INTRAMUSCULAR | Status: AC | PRN
  Administered 2024-08-16: 4 mL

## 2024-08-16 MED ORDER — METHYLPREDNISOLONE ACETATE 40 MG/ML IJ SUSP
40.0000 mg | INTRAMUSCULAR | Status: AC | PRN
  Administered 2024-08-16: 40 mg via INTRA_ARTICULAR

## 2024-08-16 NOTE — Progress Notes (Signed)
 Office Visit Note   Patient: Jillian Buchanan           Date of Birth: 08/21/1933           MRN: [id number] Visit Date: 08/16/2024              Requested by: Rexanne Ingle, MD [address] [place],  [place] [address] PCP: Rexanne Ingle, MD  Chief Complaint  Patient presents with  . Left Knee - Follow-up      HPI: Pleasant 89 year old woman who comes in occasionally for an injection into her left knee for osteoarthritis.  She has had no new injury just requesting an injection  Assessment & Plan: Visit Diagnoses:  1. Unilateral primary osteoarthritis, left knee     Plan: Went forward and with knee injection steroid today without difficulty May follow-up as needed  Follow-Up Instructions: No follow-ups on file.   Ortho Exam  Patient is alert, oriented, no adenopathy, well-dressed, normal affect, normal respiratory effort. Left knee no erythema no effusion compartments are soft and compressible she has tenderness over the medial greater than lateral joint line neurovascular intact compartments of the lower leg are soft nontender    Imaging: No results found. No images are attached to the encounter.  Labs: No results found for: HGBA1C, ESRSEDRATE, CRP, LABURIC, REPTSTATUS, GRAMSTAIN, CULT, LABORGA   Lab Results  Component Value Date   ALBUMIN 4.1 06/15/2022   ALBUMIN 4.1 04/12/2022   ALBUMIN 3.9 02/09/2022    No results found for: MG No results found for: VD25OH  No results found for: PREALBUMIN    Latest Ref Rng & Units 06/15/2022   10:14 AM 04/12/2022    1:25 PM 02/09/2022    1:33 PM  CBC EXTENDED  WBC 4.0 - 10.5 K/uL 3.9  4.6  4.6   RBC 3.87 - 5.11 MIL/uL 4.07  4.10  4.05   Hemoglobin 12.0 - 15.0 g/dL 88.1  88.3  88.6   HCT 36.0 - 46.0 % 35.3  36.0  35.0   Platelets 150 - 400 K/uL 211  215  211   NEUT# 1.7 - 7.7 K/uL 1.9  2.3  2.5   Lymph# 0.7 - 4.0 K/uL 1.3  1.4  1.4      There is no height or weight on file to  calculate BMI.  Orders:  No orders of the defined types were placed in this encounter.  No orders of the defined types were placed in this encounter.    Procedures: Large Joint Inj: L knee on 08/16/2024 11:04 AM Indications: pain and diagnostic evaluation Details: 25 G 1.5 in needle, anteromedial approach  Arthrogram: No  Medications: 40 mg methylPREDNISolone  acetate 40 MG/ML; 4 mL lidocaine  1 % Outcome: tolerated well, no immediate complications Procedure, treatment alternatives, risks and benefits explained, specific risks discussed. Consent was given by the patient.     Clinical Data: No additional findings.  ROS:  All other systems negative, except as noted in the HPI. Review of Systems  Objective: Vital Signs: There were no vitals taken for this visit.  Specialty Comments:  CLINICAL DATA:  Lumbar radiculopathy with symptoms persisting for 6 weeks in treatment   EXAM: MRI LUMBAR SPINE WITHOUT CONTRAST   TECHNIQUE: Multiplanar, multisequence MR imaging of the lumbar spine was performed. No intravenous contrast was administered.   COMPARISON:  None Available.   FINDINGS: Segmentation:  Standard.   Alignment:  Degenerative anterolisthesis at L3-4 and below   Vertebrae:  No  fracture, evidence of discitis, or bone lesion.   Conus medullaris and cauda equina: Conus extends to the L1-2 level. Conus and cauda equina appear normal.   Paraspinal and other soft tissues: Distal colonic diverticulosis. No perispinal mass or inflammation   Disc levels:   T12- L1: Mild degenerative facet spurring.   L1-L2: Degenerative facet spurring with mild disc height loss and circumferential bulging.   L2-L3: Mild disc bulging. Degenerative facet spurring on both sides.   L3-L4: Moderate degenerative facet spurring with mild anterolisthesis. The disc is desiccated and narrowed with bulging and small right foraminal protrusion. Mild triangular narrowing of the thecal  sac. Noncompressive right foraminal narrowing   L4-L5: Degenerative facet spurring which is bulky with anterolisthesis. Disc is narrowed and bulging with bilateral paracentral protrusion. Narrowing of the subarticular recesses, greater on the left but not clearly compressive. Mild bilateral foraminal narrowing   L5-S1:Degenerative facet spurring which is fairly bulky with mild anterolisthesis. No herniation or impingement.   IMPRESSION: 1. Generalized lumbar spine degeneration especially affecting facets with mild anterolisthesis at L3-4 and below. 2. Noncompressive foraminal narrowing on the right at L3-4 and bilaterally at L4-5. 3. Moderate left subarticular recess narrowing at L4-5.     Electronically Signed   By: Dorn Roulette M.D.   On: 10/15/2023 19:23  PMFS History: Patient Active Problem List   Diagnosis Date Noted  . Low back pain 09/02/2021  . Trigger finger, right middle finger 08/24/2021  . Genetic testing 06/18/2021  . Family history of breast cancer 05/26/2021  . Family history of bone cancer 05/26/2021  . Malignant neoplasm of upper-inner quadrant of left breast in female, estrogen receptor positive (HCC) 05/24/2021  . Bilateral primary osteoarthritis of knee 11/12/2019  . Obesity 11/12/2019  . Chronic right shoulder pain 11/27/2017  . Unilateral primary osteoarthritis, left knee 11/27/2017   Past Medical History:  Diagnosis Date  . Anxiety    self reported  . Breast cancer (HCC)   . Cataract   . Family history of bone cancer   . Family history of breast cancer   . Hypertension     Family History  Problem Relation Age of Onset  . Breast cancer Maternal Aunt        x5 aunts, dx >50  . Breast cancer Paternal Aunt        dx >50  . Breast cancer Cousin        x4 maternal first cousins  . Bone cancer Daughter 56    Past Surgical History:  Procedure Laterality Date  . ABDOMINAL HYSTERECTOMY    . BREAST LUMPECTOMY  1988  . BREAST LUMPECTOMY WITH  RADIOACTIVE SEED LOCALIZATION Left 06/15/2021   Procedure: LEFT BREAST LUMPECTOMY WITH RADIOACTIVE SEED LOCALIZATION;  Surgeon: Ebbie Cough, MD;  Location: [facility];  Service: General;  Laterality: Left;  . INTRAOCULAR PROSTHESES INSERTION  8/248/2005   Social History   Occupational History  . Not on file  Tobacco Use  . Smoking status: Never  . Smokeless tobacco: Never  Vaping Use  . Vaping status: Never Used  Substance and Sexual Activity  . Alcohol use: Yes    Comment: rarely, social  . Drug use: No  . Sexual activity: Never

## 2024-08-28 DIAGNOSIS — H52203 Unspecified astigmatism, bilateral: Secondary | ICD-10-CM | POA: Diagnosis not present

## 2024-08-28 DIAGNOSIS — H04123 Dry eye syndrome of bilateral lacrimal glands: Secondary | ICD-10-CM | POA: Diagnosis not present

## 2024-09-02 ENCOUNTER — Encounter: Payer: Self-pay | Admitting: Radiology

## 2024-09-09 ENCOUNTER — Other Ambulatory Visit: Payer: Self-pay

## 2024-09-09 ENCOUNTER — Ambulatory Visit: Admitting: Physical Medicine and Rehabilitation

## 2024-09-09 VITALS — BP 114/72 | HR 69

## 2024-09-09 DIAGNOSIS — M5416 Radiculopathy, lumbar region: Secondary | ICD-10-CM | POA: Diagnosis not present

## 2024-09-09 MED ORDER — METHYLPREDNISOLONE ACETATE 40 MG/ML IJ SUSP
40.0000 mg | Freq: Once | INTRAMUSCULAR | Status: AC
  Administered 2024-09-09: 40 mg

## 2024-09-09 NOTE — Progress Notes (Addendum)
 Jillian Buchanan Vitali - 89 y.o. female MRN [id number]  Date of birth: 01-12-1933  Office Visit Note: Visit Date: 09/09/2024 PCP: Rexanne Ingle, MD Referred by: Rexanne Ingle, MD  Subjective: Chief Complaint  Patient presents with   Lower Back - Pain   HPI:  Jillian Buchanan is a 89 y.o. female who comes in today for planned repeat Right L4-5  Lumbar Interlaminar epidural steroid injection with fluoroscopic guidance.  She is having more LEFT sided symptoms so we did bias the injection to the LEFT.  The patient has failed conservative care including home exercise, medications, time and activity modification.  This injection will be diagnostic and hopefully therapeutic.  Please see requesting physician notes for further details and justification. Patient received more than 50% pain relief from prior injection.   Still having a lot of left knee pain that did not respond to knee injection by Ronal Dragon. Consider referral to Dr. Burnetta for eval and management, may benefit from US  guided injection.  Referring: Duwaine Pouch, FNP   ROS Otherwise per HPI.  Assessment & Plan: Visit Diagnoses:    ICD-10-CM   1. Lumbar radiculopathy  M54.16 XR C-ARM NO REPORT    Epidural Steroid injection    methylPREDNISolone  acetate (DEPO-MEDROL ) injection 40 mg      Plan: No additional findings.   Meds & Orders:  Meds ordered this encounter  Medications   methylPREDNISolone  acetate (DEPO-MEDROL ) injection 40 mg    Orders Placed This Encounter  Procedures   XR C-ARM NO REPORT   Epidural Steroid injection    Follow-up: Return for visit to requesting provider as needed.   Procedures: No procedures performed  Lumbar Epidural Steroid Injection - Interlaminar Approach with Fluoroscopic Guidance  Patient: Jillian Buchanan      Date of Birth: 29-Apr-1933 MRN: [id number] PCP: Rexanne Ingle, MD      Visit Date: 09/09/2024   Universal Protocol:     Consent Given By: the patient  Position:  PRONE  Additional Comments: Vital signs were monitored before and after the procedure. Patient was prepped and draped in the usual sterile fashion. The correct patient, procedure, and site was verified.   Injection Procedure Details:   Procedure diagnoses: Lumbar radiculopathy [M54.16]   Meds Administered:  Meds ordered this encounter  Medications   methylPREDNISolone  acetate (DEPO-MEDROL ) injection 40 mg     Laterality: Left  Location/Site:  L4-5  Needle: 3.5 in., 20 ga. Tuohy  Needle Placement: Paramedian epidural  Findings:   -Comments: Excellent flow of contrast into the epidural space.  Procedure Details: Using a paramedian approach from the side mentioned above, the region overlying the inferior lamina was localized under fluoroscopic visualization and the soft tissues overlying this structure were infiltrated with 4 ml. of 1% Lidocaine  without Epinephrine. The Tuohy needle was inserted into the epidural space using a paramedian approach.   The epidural space was localized using loss of resistance along with counter oblique bi-planar fluoroscopic views.  After negative aspirate for air, blood, and CSF, a 2 ml. volume of Isovue-250 was injected into the epidural space and the flow of contrast was observed. Radiographs were obtained for documentation purposes.    The injectate was administered into the level noted above.   Additional Comments:  The patient tolerated the procedure well Dressing: 2 x 2 sterile gauze and Band-Aid    Post-procedure details: Patient was observed during the procedure. Post-procedure instructions were reviewed.  Patient left the clinic in stable condition.   Clinical  History: CLINICAL DATA:  Lumbar radiculopathy with symptoms persisting for 6 weeks in treatment   EXAM: MRI LUMBAR SPINE WITHOUT CONTRAST   TECHNIQUE: Multiplanar, multisequence MR imaging of the lumbar spine was performed. No intravenous contrast was administered.    COMPARISON:  None Available.   FINDINGS: Segmentation:  Standard.   Alignment:  Degenerative anterolisthesis at L3-4 and below   Vertebrae:  No fracture, evidence of discitis, or bone lesion.   Conus medullaris and cauda equina: Conus extends to the L1-2 level. Conus and cauda equina appear normal.   Paraspinal and other soft tissues: Distal colonic diverticulosis. No perispinal mass or inflammation   Disc levels:   T12- L1: Mild degenerative facet spurring.   L1-L2: Degenerative facet spurring with mild disc height loss and circumferential bulging.   L2-L3: Mild disc bulging. Degenerative facet spurring on both sides.   L3-L4: Moderate degenerative facet spurring with mild anterolisthesis. The disc is desiccated and narrowed with bulging and small right foraminal protrusion. Mild triangular narrowing of the thecal sac. Noncompressive right foraminal narrowing   L4-L5: Degenerative facet spurring which is bulky with anterolisthesis. Disc is narrowed and bulging with bilateral paracentral protrusion. Narrowing of the subarticular recesses, greater on the left but not clearly compressive. Mild bilateral foraminal narrowing   L5-S1:Degenerative facet spurring which is fairly bulky with mild anterolisthesis. No herniation or impingement.   IMPRESSION: 1. Generalized lumbar spine degeneration especially affecting facets with mild anterolisthesis at L3-4 and below. 2. Noncompressive foraminal narrowing on the right at L3-4 and bilaterally at L4-5. 3. Moderate left subarticular recess narrowing at L4-5.     Electronically Signed   By: Dorn Roulette M.D.   On: 10/15/2023 19:23     Objective:  VS:  HT:    WT:   BMI:     BP:114/72  HR:69bpm  TEMP: ( )  RESP:  Physical Exam Vitals and nursing note reviewed.  Constitutional:      General: She is not in acute distress.    Appearance: Normal appearance. She is obese. She is not ill-appearing.  HENT:     Head:  Normocephalic and atraumatic.     Right Ear: External ear normal.     Left Ear: External ear normal.  Eyes:     Extraocular Movements: Extraocular movements intact.  Cardiovascular:     Rate and Rhythm: Normal rate.     Pulses: Normal pulses.  Pulmonary:     Effort: Pulmonary effort is normal. No respiratory distress.  Abdominal:     General: There is no distension.     Palpations: Abdomen is soft.  Musculoskeletal:        General: Tenderness present.     Cervical back: Neck supple.     Right lower leg: No edema.     Left lower leg: No edema.     Comments: Patient has good distal strength with no pain over the greater trochanters.  No clonus or focal weakness.  Skin:    Findings: No erythema, lesion or rash.  Neurological:     General: No focal deficit present.     Mental Status: She is alert and oriented to person, place, and time.     Cranial Nerves: No cranial nerve deficit.     Sensory: No sensory deficit.     Motor: No weakness or abnormal muscle tone.     Coordination: Coordination normal.     Gait: Gait abnormal.  Psychiatric:        Mood and Affect:  Mood normal.        Behavior: Behavior normal.      Imaging: XR C-ARM NO REPORT Result Date: 09/09/2024 Please see Notes tab for imaging impression.  Epidural Steroid injection Result Date: 09/09/2024 Eldonna Novel, MD     09/09/2024 11:08 AM Lumbar Epidural Steroid Injection - Interlaminar Approach with Fluoroscopic Guidance Patient: Jillian Buchanan     Date of Birth: July 16, 1933 MRN: [id number] PCP: Rexanne Ingle, MD     Visit Date: 09/09/2024  Universal Protocol:   Consent Given By: the patient Position: PRONE Additional Comments: Vital signs were monitored before and after the procedure. Patient was prepped and draped in the usual sterile fashion. The correct patient, procedure, and site was verified. Injection Procedure Details: Procedure diagnoses: Lumbar radiculopathy [M54.16] Meds Administered: Meds ordered this  encounter Medications  methylPREDNISolone  acetate (DEPO-MEDROL ) injection 40 mg  Laterality: Left Location/Site:  L4-5 Needle: 3.5 in., 20 ga. Tuohy Needle Placement: Paramedian epidural Findings:  -Comments: Excellent flow of contrast into the epidural space. Procedure Details: Using a paramedian approach from the side mentioned above, the region overlying the inferior lamina was localized under fluoroscopic visualization and the soft tissues overlying this structure were infiltrated with 4 ml. of 1% Lidocaine  without Epinephrine. The Tuohy needle was inserted into the epidural space using a paramedian approach. The epidural space was localized using loss of resistance along with counter oblique bi-planar fluoroscopic views.  After negative aspirate for air, blood, and CSF, a 2 ml. volume of Isovue-250 was injected into the epidural space and the flow of contrast was observed. Radiographs were obtained for documentation purposes.  The injectate was administered into the level noted above. Additional Comments: The patient tolerated the procedure well Dressing: 2 x 2 sterile gauze and Band-Aid  Post-procedure details: Patient was observed during the procedure. Post-procedure instructions were reviewed. Patient left the clinic in stable condition.

## 2024-09-09 NOTE — Procedures (Addendum)
 Lumbar Epidural Steroid Injection - Interlaminar Approach with Fluoroscopic Guidance  Patient: Jillian Buchanan      Date of Birth: Dec 04, 1932 MRN: [id number] PCP: Rexanne Ingle, MD      Visit Date: 09/09/2024   Universal Protocol:     Consent Given By: the patient  Position: PRONE  Additional Comments: Vital signs were monitored before and after the procedure. Patient was prepped and draped in the usual sterile fashion. The correct patient, procedure, and site was verified.   Injection Procedure Details:   Procedure diagnoses: Lumbar radiculopathy [M54.16]   Meds Administered:  Meds ordered this encounter  Medications   methylPREDNISolone  acetate (DEPO-MEDROL ) injection 40 mg     Laterality: Left  Location/Site:  L4-5  Needle: 3.5 in., 20 ga. Tuohy  Needle Placement: Paramedian epidural  Findings:   -Comments: Excellent flow of contrast into the epidural space.  Procedure Details: Using a paramedian approach from the side mentioned above, the region overlying the inferior lamina was localized under fluoroscopic visualization and the soft tissues overlying this structure were infiltrated with 4 ml. of 1% Lidocaine  without Epinephrine. The Tuohy needle was inserted into the epidural space using a paramedian approach.   The epidural space was localized using loss of resistance along with counter oblique bi-planar fluoroscopic views.  After negative aspirate for air, blood, and CSF, a 2 ml. volume of Isovue-250 was injected into the epidural space and the flow of contrast was observed. Radiographs were obtained for documentation purposes.    The injectate was administered into the level noted above.   Additional Comments:  The patient tolerated the procedure well Dressing: 2 x 2 sterile gauze and Band-Aid    Post-procedure details: Patient was observed during the procedure. Post-procedure instructions were reviewed.  Patient left the clinic in stable condition.

## 2024-09-09 NOTE — Progress Notes (Signed)
 Pain Scale   Average Pain 10 Patient advising she has chronic lower back pain radiating to left leg  pain is constant        +Driver, -BT, -Dye Allergies.

## 2024-09-18 ENCOUNTER — Inpatient Hospital Stay: Payer: Medicare PPO | Attending: Hematology and Oncology | Admitting: Hematology and Oncology

## 2024-09-18 VITALS — BP 134/64 | HR 67 | Temp 98.2°F | Resp 20 | Ht 65.0 in | Wt 199.1 lb

## 2024-09-18 DIAGNOSIS — Z1721 Progesterone receptor positive status: Secondary | ICD-10-CM | POA: Diagnosis not present

## 2024-09-18 DIAGNOSIS — M25562 Pain in left knee: Secondary | ICD-10-CM | POA: Insufficient documentation

## 2024-09-18 DIAGNOSIS — M545 Low back pain, unspecified: Secondary | ICD-10-CM | POA: Insufficient documentation

## 2024-09-18 DIAGNOSIS — C50212 Malignant neoplasm of upper-inner quadrant of left female breast: Secondary | ICD-10-CM | POA: Insufficient documentation

## 2024-09-18 DIAGNOSIS — Z79811 Long term (current) use of aromatase inhibitors: Secondary | ICD-10-CM | POA: Diagnosis not present

## 2024-09-18 DIAGNOSIS — G8929 Other chronic pain: Secondary | ICD-10-CM | POA: Insufficient documentation

## 2024-09-18 DIAGNOSIS — M25561 Pain in right knee: Secondary | ICD-10-CM | POA: Insufficient documentation

## 2024-09-18 DIAGNOSIS — Z79899 Other long term (current) drug therapy: Secondary | ICD-10-CM | POA: Diagnosis not present

## 2024-09-18 DIAGNOSIS — R6883 Chills (without fever): Secondary | ICD-10-CM | POA: Diagnosis not present

## 2024-09-18 DIAGNOSIS — Z170 Estrogen receptor positive status [ER+]: Secondary | ICD-10-CM | POA: Insufficient documentation

## 2024-09-18 DIAGNOSIS — M792 Neuralgia and neuritis, unspecified: Secondary | ICD-10-CM | POA: Insufficient documentation

## 2024-09-18 DIAGNOSIS — Z9221 Personal history of antineoplastic chemotherapy: Secondary | ICD-10-CM | POA: Diagnosis not present

## 2024-09-18 DIAGNOSIS — Z1731 Human epidermal growth factor receptor 2 positive status: Secondary | ICD-10-CM | POA: Diagnosis not present

## 2024-09-18 DIAGNOSIS — R232 Flushing: Secondary | ICD-10-CM | POA: Diagnosis not present

## 2024-09-18 MED ORDER — GABAPENTIN 100 MG PO CAPS: 100.0000 mg | ORAL_CAPSULE | Freq: Every day | ORAL | Status: AC

## 2024-09-18 NOTE — Progress Notes (Signed)
 Patient Care Team: Rexanne Ingle, MD as PCP - General (Internal Medicine) Ebbie Cough, MD as Consulting Physician (General Surgery) Izell Domino, MD as Attending Physician (Radiation Oncology) Anderson Maude ORN, MD (Inactive) as Consulting Physician (Orthopedic Surgery) Rosan Credit, MD as Consulting Physician (Ophthalmology) Odean Potts, MD as Consulting Physician (Hematology and Oncology)  DIAGNOSIS:  Encounter Diagnosis  Name Primary?   Malignant neoplasm of upper-inner quadrant of left breast in female, estrogen receptor positive (HCC) Yes    SUMMARY OF ONCOLOGIC HISTORY: Oncology History  Malignant neoplasm of upper-inner quadrant of left breast in female, estrogen receptor positive (HCC)  05/18/2021 Initial Diagnosis    left breast upper inner quadrant biopsy 05/18/2021 for a clinical T1b N0, stage IA invasive ductal carcinoma, triple positive, with an MIB-1 of 15%   05/26/2021 Cancer Staging   Staging form: Breast, AJCC 8th Edition - Clinical stage from 05/26/2021: Stage IA (cT1b, cN0, cM0, G2, ER+, PR+, HER2+) - Signed by Layla Sandria BROCKS, MD on 05/26/2021 Stage prefix: Initial diagnosis Histologic grading system: 3 grade system Laterality: Left Staged by: Pathologist and managing physician Stage used in treatment planning: Yes National guidelines used in treatment planning: Yes Type of national guideline used in treatment planning: NCCN   06/15/2021 Surgery   left lumpectomy 06/15/2021 for a pT1c pNX invasive ductal carcinoma grade 2, with negative margins   06/18/2021 Genetic Testing   Negative genetic testing:  No pathogenic variants detected on the [employer] CancerNext-Expanded + RNAinsight panel. Three variants of uncertain significance (VUS) were detected - one in the ATM gene called p.F1265L (c.3793T>C), a second in the LTZR1 gene called c.2414delA (e.X194Dqd*1), and a third in the NTHL1 gene called p.P58L (c.173C>T). The report date is  06/18/2021.  The CancerNext-Expanded + RNAinsight gene panel offered by [employer] and includes sequencing and rearrangement analysis for the following 77 genes: AIP, ALK, APC, ATM, AXIN2, BAP1, BARD1, BLM, BMPR1A, BRCA1, BRCA2, BRIP1, CDC73, CDH1, CDK4, CDKN1B, CDKN2A, CHEK2, CTNNA1, DICER1, FANCC, FH, FLCN, GALNT12, KIF1B, LZTR1, MAX, MEN1, MET, MLH1, MSH2, MSH3, MSH6, MUTYH, NBN, NF1, NF2, NTHL1, PALB2, PHOX2B, PMS2, POT1, PRKAR1A, PTCH1, PTEN, RAD51C, RAD51D, RB1, RECQL, RET, SDHA, SDHAF2, SDHB, SDHC, SDHD, SMAD4, SMARCA4, SMARCB1, SMARCE1, STK11, SUFU, TMEM127, TP53, TSC1, TSC2, VHL and XRCC2 (sequencing and deletion/duplication); EGFR, EGLN1, HOXB13, KIT, MITF, PDGFRA, POLD1 and POLE (sequencing only); EPCAM and GREM1 (deletion/duplication only). RNA data is routinely analyzed for use in variant interpretation for all genes.    07/12/2021 -  Anti-estrogen oral therapy   anastrozole  started 07/12/2021   07/15/2021 -  Chemotherapy   Patient is on Treatment Plan : BREAST Trastuzumab  q21d       CHIEF COMPLIANT: Surveillance of breast cancer on anastrozole  therapy  HISTORY OF PRESENT ILLNESS:  History of Present Illness Jillian Buchanan is an 89 year old female with breast cancer who presents for follow-up regarding her medication regimen.  She has been on anastrozole  since August 2022, experiencing hot flashes and chills, described as a 'warm wave' followed by a 'cool wave'. She takes anastrozole  at night.  In July, she had a mammogram at [facility], which showed a category B density.  She has undergone multiple surgeries related to breast cancer, with the last surgery performed by Ebbie. She describes the scar tissue as 'lumpy'.     ALLERGIES:  has no known allergies.  MEDICATIONS:  Current Outpatient Medications  Medication Sig Dispense Refill   acetaminophen  (TYLENOL ) 325 MG tablet      ALPRAZolam (XANAX) 0.5 MG tablet  TAKE 1 TABLET BY MOUTH EVERY DAY AS NEEDED FOR ANXIETY  3    amLODipine (NORVASC) 5 MG tablet Take 5 mg by mouth daily.       anastrozole  (ARIMIDEX ) 1 MG tablet TAKE 1 TABLET BY MOUTH EVERY DAY 90 tablet 3   aspirin 81 MG EC tablet TAKE ONE (1) TABLET EACH DAY  3   bumetanide (BUMEX) 0.5 MG tablet Take 0.5 mg by mouth daily.       carvedilol  (COREG ) 3.125 MG tablet TAKE 1 TABLET BY MOUTH TWICE A DAY WITH A MEAL 180 tablet 3   diazepam  (VALIUM ) 5 MG tablet Take one tablet by mouth with food one hour prior to procedure. May repeat 30 minutes prior if needed. 2 tablet 0   hydroquinone 4 % cream AS DIRECTED TO AFFECTED AREA TWICE A DAY AS NEEDED EXTERNALLY     levothyroxine (SYNTHROID, LEVOTHROID) 88 MCG tablet Take 88 mcg by mouth daily before breakfast.     losartan (COZAAR) 100 MG tablet Take 100 mg by mouth daily.     methocarbamol  (ROBAXIN ) 500 MG tablet Take 1 tablet (500 mg total) by mouth every 8 (eight) hours as needed for muscle spasms. 30 tablet 1   naproxen sodium (ALEVE) 220 MG tablet Take 220 mg by mouth as needed.     rosuvastatin (CRESTOR) 5 MG tablet Take 5 mg by mouth daily.     triamcinolone cream (KENALOG) 0.5 % APPLY TO AFFECTED AREA OF LEG TWICE A DAY AS NEEDED  3   No current facility-administered medications for this visit.    PHYSICAL EXAMINATION: ECOG PERFORMANCE STATUS: 1 - Symptomatic but completely ambulatory  Vitals:   09/18/24 1052  BP: 134/64  Pulse: 67  Resp: 20  Temp: 98.2 F (36.8 C)  SpO2: 99%   Filed Weights   09/18/24 1052  Weight: 199 lb 1.6 oz (90.3 kg)    Physical Exam Breast exam: No palpable lumps noticed in the right breast.  Left breast scar tissue is lumpy and nodular.  (exam performed in the presence of a chaperone)  LABORATORY DATA:  I have reviewed the data as listed    Latest Ref Rng & Units 06/15/2022   10:14 AM 04/12/2022    1:25 PM 02/09/2022    1:34 PM  CMP  Glucose 70 - 99 mg/dL 99  896  893   BUN 8 - 23 mg/dL 23  22  23    Creatinine 0.44 - 1.00 mg/dL 9.08  9.11  9.08   Sodium 135  - 145 mmol/L 138  140  138   Potassium 3.5 - 5.1 mmol/L 3.9  3.7  3.5   Chloride 98 - 111 mmol/L 107  106  105   CO2 22 - 32 mmol/L 26  28  25    Calcium 8.9 - 10.3 mg/dL 9.7  9.9  9.6   Total Protein 6.5 - 8.1 g/dL 7.8  7.8  7.6   Total Bilirubin 0.3 - 1.2 mg/dL 0.5  0.4  0.6   Alkaline Phos 38 - 126 U/L 72  64  65   AST 15 - 41 U/L 16  14  18    ALT 0 - 44 U/L 13  11  15      Lab Results  Component Value Date   WBC 3.9 (L) 06/15/2022   HGB 11.8 (L) 06/15/2022   HCT 35.3 (L) 06/15/2022   MCV 86.7 06/15/2022   PLT 211 06/15/2022   NEUTROABS 1.9 06/15/2022  ASSESSMENT & PLAN:  Malignant neoplasm of upper-inner quadrant of left breast in female, estrogen receptor positive (HCC) left lumpectomy 06/15/2021 for a pT1c pNX invasive ductal carcinoma grade 2 size: 1.4 cm with intermediate grade DCIS, with negative margins, ER 100%, PR 2%, HER2 positive, Ki-67 15% Adjuvant Herceptin  completed 07/06/2022   Current treatment:  anastrozole  started 07/12/2021 Anastrozole  toxicities:  Intermittent fatigue and hot flashes  Joint stiffness and achiness which gets better with activity patient uses a cane to get around and has knee brace.   Breast cancer surveillance: Breast exam 09/18/2024: Benign Mammogram 05/21/2024 at [facility]: Benign breast density category B   Return to clinic in 1 year for follow-up ------------------------------------- Assessment and Plan Assessment & Plan Malignant neoplasm of upper-inner quadrant of left breast, status post surgery and ongoing anastrozole  therapy Post-surgical status with ongoing anastrozole  therapy since September 2022. July mammogram showed category B density. Anastrozole  well-tolerated with manageable side effects. - Continue anastrozole  therapy for two more years. - Scheduled a one-year follow-up appointment.  Chronic nerve pain and low back pain Gabapentin  100 mg at bedtime effective for nerve pain. Recent epidural injection for back pain. -  Continue gabapentin  100 mg at bedtime.  Chronic bilateral knee pain Recent epidural injection for pain management.      No orders of the defined types were placed in this encounter.  The patient has a good understanding of the overall plan. she agrees with it. she will call with any problems that may develop before the next visit here.  I personally spent a total of 30 minutes in the care of the patient today including preparing to see the patient, getting/reviewing separately obtained history, performing a medically appropriate exam/evaluation, counseling and educating, placing orders, referring and communicating with other health care professionals, documenting clinical information in the EHR, independently interpreting results, communicating results, and coordinating care.   Viinay K Tmya Wigington, MD 09/18/24

## 2024-09-18 NOTE — Assessment & Plan Note (Signed)
 left lumpectomy 06/15/2021 for a pT1c pNX invasive ductal carcinoma grade 2 size: 1.4 cm with intermediate grade DCIS, with negative margins, ER 100%, PR 2%, HER2 positive, Ki-67 15% Adjuvant Herceptin  completed 07/06/2022   Current treatment:  anastrozole  started 07/12/2021 Anastrozole  toxicities:  Intermittent fatigue and hot flashes  Joint stiffness and achiness which gets better with activity patient uses a cane to get around and has knee brace.   Breast cancer surveillance: Breast exam 09/18/2024: Benign Mammogram 05/21/2024 at [facility]: Benign breast density category B   Return to clinic in 1 year for follow-up

## 2024-09-19 ENCOUNTER — Other Ambulatory Visit: Payer: Self-pay

## 2024-10-30 NOTE — Progress Notes (Signed)
 "     Cardiology Office Note Date:  11/04/2024  ID:  Jillian Buchanan, DOB 25-May-1933, MRN [id number] PCP:  Rexanne Ingle, MD  Cardiologist:  Joelle VEAR Ren Donley, MD  Chief Complaint  Patient presents with   cardiotoxicity     Problems Left breast Ca s/p herceptin  therapy c/b low GLS Last dose in 9/23; started on CL3.125 BID for cardioprotection TTE 12/23: 45-50%, GH (down from 50-55% 7/23) Lymphedema M: AE5, ASA81, CL3.125, LN100, RN5  Visits  [facility] 9/23: Limited TTE in 3 months after therapy cessation 1/26: TTE w/ GLS, LP, increase CL to 12.5, f/u in 2 months   Discussed the use of AI scribe software for clinical note transcription with the patient, who gave verbal consent to proceed.  History of Present Illness   Jillian Buchanan is an 89 year old female who presents with follow up. She describes an intermittent trickling discomfort starting in the upper arm and moving down, mainly at night while relaxing, present for several months and brief with each episode. She denies associated shortness of breath. She has prior breast cancer with three surgeries on the affected side and wonders if this contributes to her arm symptoms. She has ankle swelling for the past month, worse in the evening and improved by morning. Compression stockings help somewhat. She takes Bumex, which reduces but does not fully resolve the edema. Her blood pressure today is 194/94 mmHg. At home it is usually about 172/60 mmHg. She checks with both wrist and arm cuffs and prefers the wrist cuff.    ROS: Please see the history of present illness. All other systems are reviewed and negative.   PHYSICAL EXAM: VS:  BP (!) 162/94 (BP Location: Right Arm, Patient Position: Sitting, Cuff Size: Normal)   Pulse 70   Ht 5' 5 (1.651 m)   Wt 201 lb 9.6 oz (91.4 kg)   SpO2 98%   BMI 33.55 kg/m  , BMI Body mass index is 33.55 kg/m. GEN: Well nourished, well developed, in no acute distress HEENT: normal Neck: no JVD, carotid  bruits, or masses Cardiac: RRR; no murmurs, rubs, or gallops,no edema  Respiratory:  CTAB bilaterally, normal work of breathing GI: soft, nontender, nondistended, + BS Extremities: No LE edema Skin: warm and dry, no rash Neuro:  Strength and sensation are intact  EKG: LBBB (stable)  Recent Labs: Reviewed  Studies: Reviewed  ASSESSMENT AND PLAN: Jillian Buchanan is a 89 y.o. female who presents for follow up.     Primary hypertension Blood pressure elevated at 194/160 mmHg, home readings 172/60 mmHg. Current medication carvedilol  3.125 mg twice daily. High risk of myocardial infarction and cerebrovascular accident. - Increased carvedilol  to 12.5 mg twice daily. - Provided blood pressure log for home monitoring. - Instructed to check blood pressure every morning and record readings. - Will review blood pressure log in two weeks.  Chronic heart failure with mildly reduced ejection fraction No symptoms of heart failure exacerbation. Leg swelling not attributed to heart failure. - Ordered echocardiogram to assess heart function. - Cont bumex 0.5 mg daily; can increase if persistent LE edema  Venous insufficiency Ankle swelling worse in evening, consistent with venous insufficiency. Compression stockings provide relief. Bumex used for diuresis. - Continue compression stockings, preferably knee-high. - Continue Bumex as needed for swelling.  Hyperlipidemia Cholesterol levels not checked recently. - Ordered cholesterol panel.       Signed, Joelle VEAR Ren Donley, MD  11/04/2024 11:30 AM  [facility] "

## 2024-11-04 ENCOUNTER — Ambulatory Visit

## 2024-11-04 VITALS — BP 162/94 | HR 70 | Ht 65.0 in | Wt 201.6 lb

## 2024-11-04 DIAGNOSIS — C50212 Malignant neoplasm of upper-inner quadrant of left female breast: Secondary | ICD-10-CM

## 2024-11-04 DIAGNOSIS — I427 Cardiomyopathy due to drug and external agent: Secondary | ICD-10-CM

## 2024-11-04 DIAGNOSIS — I5022 Chronic systolic (congestive) heart failure: Secondary | ICD-10-CM | POA: Diagnosis not present

## 2024-11-04 DIAGNOSIS — I10 Essential (primary) hypertension: Secondary | ICD-10-CM

## 2024-11-04 DIAGNOSIS — E785 Hyperlipidemia, unspecified: Secondary | ICD-10-CM | POA: Diagnosis not present

## 2024-11-04 DIAGNOSIS — Z170 Estrogen receptor positive status [ER+]: Secondary | ICD-10-CM | POA: Diagnosis not present

## 2024-11-04 MED ORDER — CARVEDILOL 12.5 MG PO TABS: 12.5000 mg | ORAL_TABLET | Freq: Two times a day (BID) | ORAL | 3 refills | Status: AC

## 2024-11-04 NOTE — Patient Instructions (Signed)
 Medication Instructions:  Your physician has recommended you make the following change in your medication:  INCREASE: Coreg  12.5 mg twice daily   HOW TO TAKE YOUR BLOOD PRESSURE: Rest 5 minutes before taking your blood pressure. Dont smoke or drink caffeinated beverages for at least 30 minutes before. Take your blood pressure before (not after) you eat. Sit comfortably with your back supported and both feet on the floor (dont cross your legs). Elevate your arm to heart level on a table or a desk. Use the proper sized cuff. It should fit smoothly and snugly around your bare upper arm. There should be enough room to slip a fingertip under the cuff. The bottom edge of the cuff should be 1 inch above the crease of the elbow. Ideally, take 3 measurements at one sitting and record the average.  Blood Pressure Log Date/Time Medications taken? (Y/N) Blood Pressure Heart Rate/Pulse                                                                                                                                                                                         *If you need a refill on your cardiac medications before your next appointment, please call your pharmacy*  Lab Work: Lipids If you have labs (blood work) drawn today and your tests are completely normal, you will receive your results only by: MyChart Message (if you have MyChart) OR A paper copy in the mail If you have any lab test that is abnormal or we need to change your treatment, we will call you to review the results.  Testing/Procedures: Your physician has requested that you have an echocardiogram. Echocardiography is a painless test that uses sound waves to create images of your heart. It provides your doctor with information about the size and shape of your heart and how well your hearts chambers and valves are working. This procedure takes approximately one hour. There are no restrictions for this  procedure. Please do NOT wear cologne, perfume, aftershave, or lotions (deodorant is allowed). Please arrive 15 minutes prior to your appointment time.  Please note: We ask at that you not bring children with you during ultrasound (echo/ vascular) testing. Due to room size and safety concerns, children are not allowed in the ultrasound rooms during exams. Our front office staff cannot provide observation of children in our lobby area while testing is being conducted. An adult accompanying a patient to their appointment will only be allowed in the ultrasound room at the discretion of the ultrasound technician under special circumstances. We apologize for any inconvenience.   Follow-Up: At [facility], you and your health needs are our  priority.  As part of our continuing mission to provide you with exceptional heart care, our providers are all part of one team.  This team includes your primary Cardiologist (physician) and Advanced Practice Providers or APPs (Physician Assistants and Nurse Practitioners) who all work together to provide you with the care you need, when you need it.  Your next appointment:   2 month(s)  Provider:   One of our Advanced Practice Providers (APPs): Morse Clause, PA-C  Lamarr Satterfield, NP Miriam Shams, NP  Olivia Pavy, PA-C Josefa Beauvais, NP  Leontine Salen, PA-C Orren Fabry, PA-C  [place], PA-C Ernest Dick, NP  Damien Braver, NP Jon Hails, PA-C  Waddell Donath, PA-C    Dayna Dunn, PA-C  Scott Weaver, PA-C Lum Louis, NP Katlyn West, NP Callie Goodrich, PA-C  Xika Zhao, NP Sheng Haley, PA-C    Kathleen Johnson, PA-C

## 2024-11-05 ENCOUNTER — Ambulatory Visit: Payer: Self-pay

## 2024-11-05 LAB — LIPID PANEL
Chol/HDL Ratio: 2.6 ratio (ref 0.0–4.4)
Cholesterol, Total: 136 mg/dL (ref 100–199)
HDL: 53 mg/dL
LDL Chol Calc (NIH): 70 mg/dL (ref 0–99)
Triglycerides: 60 mg/dL (ref 0–149)
VLDL Cholesterol Cal: 13 mg/dL (ref 5–40)

## 2024-12-11 ENCOUNTER — Ambulatory Visit (HOSPITAL_COMMUNITY)

## 2024-12-30 ENCOUNTER — Ambulatory Visit: Admitting: Emergency Medicine

## 2025-01-08 ENCOUNTER — Ambulatory Visit: Admitting: Emergency Medicine

## 2025-09-17 ENCOUNTER — Inpatient Hospital Stay: Admitting: Hematology and Oncology
